# Patient Record
Sex: Male | Born: 1955 | Race: White | Hispanic: No | Marital: Married | Smoking: Never smoker
Health system: Southern US, Community
[De-identification: ages and names within clinical notes are randomized; demographics above are authoritative.]

## PROBLEM LIST (undated history)

## (undated) DIAGNOSIS — Z9889 Other specified postprocedural states: Secondary | ICD-10-CM

## (undated) DIAGNOSIS — T4145XA Adverse effect of unspecified anesthetic, initial encounter: Secondary | ICD-10-CM

## (undated) DIAGNOSIS — D473 Essential (hemorrhagic) thrombocythemia: Secondary | ICD-10-CM

## (undated) DIAGNOSIS — D759 Disease of blood and blood-forming organs, unspecified: Secondary | ICD-10-CM

## (undated) DIAGNOSIS — R112 Nausea with vomiting, unspecified: Secondary | ICD-10-CM

## (undated) DIAGNOSIS — J189 Pneumonia, unspecified organism: Secondary | ICD-10-CM

## (undated) DIAGNOSIS — M1990 Unspecified osteoarthritis, unspecified site: Secondary | ICD-10-CM

## (undated) DIAGNOSIS — K219 Gastro-esophageal reflux disease without esophagitis: Secondary | ICD-10-CM

## (undated) DIAGNOSIS — G4730 Sleep apnea, unspecified: Secondary | ICD-10-CM

## (undated) DIAGNOSIS — D75839 Thrombocytosis, unspecified: Secondary | ICD-10-CM

## (undated) DIAGNOSIS — T8859XA Other complications of anesthesia, initial encounter: Secondary | ICD-10-CM

## (undated) HISTORY — DX: Thrombocytosis, unspecified: D75.839

## (undated) HISTORY — PX: ROTATOR CUFF REPAIR: SHX139

## (undated) HISTORY — DX: Pneumonia, unspecified organism: J18.9

## (undated) HISTORY — DX: Disease of blood and blood-forming organs, unspecified: D75.9

## (undated) HISTORY — DX: Essential (hemorrhagic) thrombocythemia: D47.3

---

## 2008-04-18 ENCOUNTER — Ambulatory Visit: Payer: Self-pay | Admitting: Hematology and Oncology

## 2008-05-03 LAB — CBC WITH DIFFERENTIAL/PLATELET
Basophils Absolute: 0 10*3/uL (ref 0.0–0.1)
EOS%: 2.6 % (ref 0.0–7.0)
Eosinophils Absolute: 0.2 10*3/uL (ref 0.0–0.5)
HCT: 46.5 % (ref 38.7–49.9)
HGB: 15.8 g/dL (ref 13.0–17.1)
LYMPH%: 26.6 % (ref 14.0–48.0)
MCV: 92.9 fL (ref 81.6–98.0)
MONO%: 9.2 % (ref 0.0–13.0)
NEUT#: 3.6 10*3/uL (ref 1.5–6.5)
NEUT%: 60.8 % (ref 40.0–75.0)
Platelets: 484 10*3/uL — ABNORMAL HIGH (ref 145–400)
RBC: 5.01 10*6/uL (ref 4.20–5.71)
WBC: 6 10*3/uL (ref 4.0–10.0)
lymph#: 1.6 10*3/uL (ref 0.9–3.3)

## 2008-05-07 LAB — HEPATITIS B SURFACE ANTIGEN: Hepatitis B Surface Ag: NEGATIVE

## 2008-05-07 LAB — COMPREHENSIVE METABOLIC PANEL
ALT: 15 U/L (ref 0–53)
AST: 17 U/L (ref 0–37)
BUN: 13 mg/dL (ref 6–23)
CO2: 26 mEq/L (ref 19–32)
Calcium: 9.9 mg/dL (ref 8.4–10.5)
Creatinine, Ser: 0.81 mg/dL (ref 0.40–1.50)
Total Bilirubin: 1.3 mg/dL — ABNORMAL HIGH (ref 0.3–1.2)

## 2008-05-07 LAB — IRON AND TIBC
%SAT: 44 % (ref 20–55)
TIBC: 329 ug/dL (ref 215–435)

## 2008-05-07 LAB — HEPATITIS A ANTIBODY, IGM: Hep A IgM: NEGATIVE

## 2008-05-07 LAB — HEPATITIS B CORE ANTIBODY, IGM: Hep B C IgM: NEGATIVE

## 2008-05-07 LAB — HEPATITIS C ANTIBODY: HCV Ab: NEGATIVE

## 2008-05-07 LAB — FERRITIN: Ferritin: 41 ng/mL (ref 22–322)

## 2008-05-07 LAB — HEPATITIS B CORE ANTIBODY, TOTAL: Hep B Core Total Ab: NEGATIVE

## 2008-05-10 ENCOUNTER — Ambulatory Visit (HOSPITAL_COMMUNITY): Admission: RE | Admit: 2008-05-10 | Discharge: 2008-05-10 | Payer: Self-pay | Admitting: Hematology and Oncology

## 2008-05-14 LAB — BCR/ABL

## 2008-09-09 ENCOUNTER — Ambulatory Visit: Payer: Self-pay | Admitting: Hematology and Oncology

## 2008-11-27 ENCOUNTER — Ambulatory Visit: Payer: Self-pay | Admitting: Hematology and Oncology

## 2008-11-29 LAB — CBC WITH DIFFERENTIAL/PLATELET
EOS%: 2.4 % (ref 0.0–7.0)
Eosinophils Absolute: 0.2 10*3/uL (ref 0.0–0.5)
MCHC: 34 g/dL (ref 32.0–36.0)
MONO#: 0.8 10*3/uL (ref 0.1–0.9)
Platelets: 609 10*3/uL — ABNORMAL HIGH (ref 140–400)
RBC: 4.92 10*6/uL (ref 4.20–5.82)
RDW: 14 % (ref 11.0–14.6)
WBC: 6.3 10*3/uL (ref 4.0–10.3)

## 2008-11-29 LAB — COMPREHENSIVE METABOLIC PANEL
ALT: 16 U/L (ref 0–53)
Albumin: 4.2 g/dL (ref 3.5–5.2)
CO2: 27 mEq/L (ref 19–32)
Calcium: 9.7 mg/dL (ref 8.4–10.5)
Chloride: 103 mEq/L (ref 96–112)
Creatinine, Ser: 0.85 mg/dL (ref 0.40–1.50)
Potassium: 4.6 mEq/L (ref 3.5–5.3)
Total Protein: 7 g/dL (ref 6.0–8.3)

## 2009-01-08 LAB — CBC WITH DIFFERENTIAL/PLATELET
BASO%: 0.9 % (ref 0.0–2.0)
Eosinophils Absolute: 0.2 10*3/uL (ref 0.0–0.5)
HCT: 45.3 % (ref 38.4–49.9)
LYMPH%: 27 % (ref 14.0–49.0)
NEUT%: 56.2 % (ref 39.0–75.0)
Platelets: 557 10*3/uL — ABNORMAL HIGH (ref 140–400)
RBC: 4.83 10*6/uL (ref 4.20–5.82)
WBC: 6.5 10*3/uL (ref 4.0–10.3)
lymph#: 1.8 10*3/uL (ref 0.9–3.3)

## 2009-03-10 ENCOUNTER — Ambulatory Visit: Payer: Self-pay | Admitting: Hematology and Oncology

## 2009-03-13 LAB — CBC WITH DIFFERENTIAL/PLATELET
HCT: 45.2 % (ref 38.4–49.9)
HGB: 15.3 g/dL (ref 13.0–17.1)
LYMPH%: 32.1 % (ref 14.0–49.0)
MCH: 31.7 pg (ref 27.2–33.4)
MCV: 93.6 fL (ref 79.3–98.0)
MONO#: 0.7 10*3/uL (ref 0.1–0.9)
Platelets: 590 10*3/uL — ABNORMAL HIGH (ref 140–400)
RBC: 4.83 10*6/uL (ref 4.20–5.82)
RDW: 14.4 % (ref 11.0–14.6)
lymph#: 1.9 10*3/uL (ref 0.9–3.3)

## 2009-03-13 LAB — BASIC METABOLIC PANEL
BUN: 15 mg/dL (ref 6–23)
Calcium: 9.5 mg/dL (ref 8.4–10.5)
Creatinine, Ser: 0.79 mg/dL (ref 0.40–1.50)

## 2009-06-11 ENCOUNTER — Ambulatory Visit: Payer: Self-pay | Admitting: Hematology and Oncology

## 2009-07-02 LAB — CBC WITH DIFFERENTIAL/PLATELET
Basophils Absolute: 0 10*3/uL (ref 0.0–0.1)
MONO#: 0.6 10*3/uL (ref 0.1–0.9)
Platelets: 595 10*3/uL — ABNORMAL HIGH (ref 140–400)
RBC: 4.67 10*6/uL (ref 4.20–5.82)
RDW: 14.3 % (ref 11.0–14.6)
WBC: 5.4 10*3/uL (ref 4.0–10.3)
lymph#: 1.3 10*3/uL (ref 0.9–3.3)

## 2009-09-09 ENCOUNTER — Ambulatory Visit: Payer: Self-pay | Admitting: Hematology and Oncology

## 2009-09-11 LAB — BASIC METABOLIC PANEL
BUN: 20 mg/dL (ref 6–23)
Chloride: 102 mEq/L (ref 96–112)
Potassium: 4.5 mEq/L (ref 3.5–5.3)
Sodium: 136 mEq/L (ref 135–145)

## 2009-09-11 LAB — CBC WITH DIFFERENTIAL/PLATELET
BASO%: 0.7 % (ref 0.0–2.0)
Basophils Absolute: 0.1 10*3/uL (ref 0.0–0.1)
EOS%: 4.3 % (ref 0.0–7.0)
HCT: 46.4 % (ref 38.4–49.9)
LYMPH%: 25.6 % (ref 14.0–49.0)
MCH: 32.7 pg (ref 27.2–33.4)
MCHC: 34.1 g/dL (ref 32.0–36.0)
MCV: 96 fL (ref 79.3–98.0)
NEUT#: 4.3 10*3/uL (ref 1.5–6.5)
NEUT%: 55.9 % (ref 39.0–75.0)
RDW: 14.4 % (ref 11.0–14.6)

## 2009-09-24 ENCOUNTER — Encounter: Admission: RE | Admit: 2009-09-24 | Discharge: 2009-09-24 | Payer: Self-pay | Admitting: Family Medicine

## 2009-12-17 ENCOUNTER — Ambulatory Visit: Payer: Self-pay | Admitting: Hematology and Oncology

## 2009-12-18 LAB — CBC WITH DIFFERENTIAL/PLATELET
Basophils Absolute: 0 10*3/uL (ref 0.0–0.1)
EOS%: 3.5 % (ref 0.0–7.0)
HCT: 44.6 % (ref 38.4–49.9)
HGB: 15.2 g/dL (ref 13.0–17.1)
LYMPH%: 25.5 % (ref 14.0–49.0)
MCHC: 34.1 g/dL (ref 32.0–36.0)
MONO#: 0.8 10*3/uL (ref 0.1–0.9)
NEUT#: 3.7 10*3/uL (ref 1.5–6.5)
NEUT%: 58.4 % (ref 39.0–75.0)
WBC: 6.3 10*3/uL (ref 4.0–10.3)
lymph#: 1.6 10*3/uL (ref 0.9–3.3)

## 2010-03-09 ENCOUNTER — Ambulatory Visit: Payer: Self-pay | Admitting: Hematology and Oncology

## 2010-03-11 LAB — CBC WITH DIFFERENTIAL/PLATELET
BASO%: 1.2 % (ref 0.0–2.0)
EOS%: 4.7 % (ref 0.0–7.0)
HCT: 43.2 % (ref 38.4–49.9)
HGB: 14.7 g/dL (ref 13.0–17.1)
MCH: 31.9 pg (ref 27.2–33.4)
MCHC: 34 g/dL (ref 32.0–36.0)
MCV: 93.8 fL (ref 79.3–98.0)
MONO%: 14.2 % — ABNORMAL HIGH (ref 0.0–14.0)
NEUT#: 3 10*3/uL (ref 1.5–6.5)
WBC: 6 10*3/uL (ref 4.0–10.3)

## 2010-03-11 LAB — COMPREHENSIVE METABOLIC PANEL
Calcium: 9.1 mg/dL (ref 8.4–10.5)
Chloride: 104 mEq/L (ref 96–112)
Creatinine, Ser: 0.74 mg/dL (ref 0.40–1.50)
Sodium: 137 mEq/L (ref 135–145)
Total Bilirubin: 0.8 mg/dL (ref 0.3–1.2)

## 2010-03-11 LAB — LACTATE DEHYDROGENASE: LDH: 156 U/L (ref 94–250)

## 2010-09-07 ENCOUNTER — Ambulatory Visit: Payer: Self-pay | Admitting: Hematology and Oncology

## 2010-09-09 LAB — CBC WITH DIFFERENTIAL/PLATELET
BASO%: 0.4 % (ref 0.0–2.0)
Basophils Absolute: 0 10*3/uL (ref 0.0–0.1)
EOS%: 2.2 % (ref 0.0–7.0)
HCT: 46.5 % (ref 38.4–49.9)
HGB: 15.5 g/dL (ref 13.0–17.1)
MCH: 31.5 pg (ref 27.2–33.4)
MCHC: 33.4 g/dL (ref 32.0–36.0)
Platelets: 655 10*3/uL — ABNORMAL HIGH (ref 140–400)
RBC: 4.92 10*6/uL (ref 4.20–5.82)
lymph#: 1.7 10*3/uL (ref 0.9–3.3)

## 2010-09-09 LAB — BASIC METABOLIC PANEL
CO2: 23 mEq/L (ref 19–32)
Sodium: 137 mEq/L (ref 135–145)

## 2010-09-09 LAB — LACTATE DEHYDROGENASE: LDH: 150 U/L (ref 94–250)

## 2010-09-14 LAB — HYPERCOAGULABLE PANEL, COMPREHENSIVE
Beta-2 Glyco I IgG: 0 G Units (ref <20)
Lupus Anticoagulant: NOT DETECTED
Protein C Activity: 125 % (ref 75–133)
Protein C, Total: 100 % (ref 70–140)

## 2011-03-03 ENCOUNTER — Other Ambulatory Visit: Payer: Self-pay | Admitting: Hematology and Oncology

## 2011-03-03 ENCOUNTER — Encounter (HOSPITAL_BASED_OUTPATIENT_CLINIC_OR_DEPARTMENT_OTHER): Payer: BC Managed Care – PPO | Admitting: Hematology and Oncology

## 2011-03-03 DIAGNOSIS — D473 Essential (hemorrhagic) thrombocythemia: Secondary | ICD-10-CM

## 2011-03-03 LAB — BASIC METABOLIC PANEL
CO2: 25 mEq/L (ref 19–32)
Calcium: 9.9 mg/dL (ref 8.4–10.5)
Creatinine, Ser: 0.77 mg/dL (ref 0.50–1.35)
Potassium: 4.8 mEq/L (ref 3.5–5.3)
Sodium: 137 mEq/L (ref 135–145)

## 2011-03-03 LAB — CBC WITH DIFFERENTIAL/PLATELET
EOS%: 2.7 % (ref 0.0–7.0)
HCT: 46.3 % (ref 38.4–49.9)
MCHC: 33.7 g/dL (ref 32.0–36.0)
NEUT%: 61.2 % (ref 39.0–75.0)
RBC: 4.98 10*6/uL (ref 4.20–5.82)
lymph#: 1.9 10*3/uL (ref 0.9–3.3)

## 2011-04-12 ENCOUNTER — Emergency Department (HOSPITAL_COMMUNITY): Payer: Worker's Compensation

## 2011-04-12 ENCOUNTER — Emergency Department (HOSPITAL_COMMUNITY)
Admission: EM | Admit: 2011-04-12 | Discharge: 2011-04-12 | Disposition: A | Discharge status: Home / Self Care | Payer: Worker's Compensation | Attending: Emergency Medicine | Admitting: Emergency Medicine

## 2011-04-12 DIAGNOSIS — R5383 Other fatigue: Secondary | ICD-10-CM | POA: Insufficient documentation

## 2011-04-12 DIAGNOSIS — R110 Nausea: Secondary | ICD-10-CM | POA: Insufficient documentation

## 2011-04-12 DIAGNOSIS — R05 Cough: Secondary | ICD-10-CM | POA: Insufficient documentation

## 2011-04-12 DIAGNOSIS — R5381 Other malaise: Secondary | ICD-10-CM | POA: Insufficient documentation

## 2011-04-12 DIAGNOSIS — R42 Dizziness and giddiness: Secondary | ICD-10-CM | POA: Insufficient documentation

## 2011-04-12 DIAGNOSIS — Z79899 Other long term (current) drug therapy: Secondary | ICD-10-CM | POA: Insufficient documentation

## 2011-04-12 DIAGNOSIS — R509 Fever, unspecified: Secondary | ICD-10-CM | POA: Insufficient documentation

## 2011-04-12 DIAGNOSIS — M25519 Pain in unspecified shoulder: Secondary | ICD-10-CM | POA: Insufficient documentation

## 2011-04-12 DIAGNOSIS — R059 Cough, unspecified: Secondary | ICD-10-CM | POA: Insufficient documentation

## 2011-04-12 DIAGNOSIS — Z7982 Long term (current) use of aspirin: Secondary | ICD-10-CM | POA: Insufficient documentation

## 2011-04-12 LAB — URINALYSIS, ROUTINE W REFLEX MICROSCOPIC
Ketones, ur: NEGATIVE mg/dL
Leukocytes, UA: NEGATIVE
Nitrite: NEGATIVE
Protein, ur: NEGATIVE mg/dL

## 2011-04-12 LAB — COMPREHENSIVE METABOLIC PANEL
ALT: 103 U/L — ABNORMAL HIGH (ref 0–53)
AST: 55 U/L — ABNORMAL HIGH (ref 0–37)
Albumin: 3 g/dL — ABNORMAL LOW (ref 3.5–5.2)
Alkaline Phosphatase: 106 U/L (ref 39–117)
BUN: 15 mg/dL (ref 6–23)
CO2: 31 mEq/L (ref 19–32)
Calcium: 9.3 mg/dL (ref 8.4–10.5)
Chloride: 97 mEq/L (ref 96–112)
Creatinine, Ser: 0.7 mg/dL (ref 0.50–1.35)
GFR calc Af Amer: >60 mL/min (ref >60)
GFR calc non Af Amer: >60 mL/min (ref >60)
Glucose, Bld: 103 mg/dL — ABNORMAL HIGH (ref 70–99)
Potassium: 4.1 mEq/L (ref 3.5–5.1)
Sodium: 136 mEq/L (ref 135–145)
Total Bilirubin: 0.4 mg/dL (ref 0.3–1.2)
Total Protein: 7.4 g/dL (ref 6.0–8.3)

## 2011-04-12 LAB — CBC
HCT: 37.1 % — ABNORMAL LOW (ref 39.0–52.0)
MCV: 91.2 fL (ref 78.0–100.0)
RBC: 4.07 MIL/uL — ABNORMAL LOW (ref 4.22–5.81)
RDW: 13.7 % (ref 11.5–15.5)
WBC: 12 10*3/uL — ABNORMAL HIGH (ref 4.0–10.5)

## 2011-04-12 LAB — DIFFERENTIAL
Eosinophils Relative: 3 % (ref 0–5)
Lymphocytes Relative: 16 % (ref 12–46)
Lymphs Abs: 2 10*3/uL (ref 0.7–4.0)

## 2011-04-12 LAB — LACTIC ACID, PLASMA: Lactic Acid, Venous: 0.8 mmol/L (ref 0.5–2.2)

## 2011-07-28 ENCOUNTER — Telehealth: Payer: Self-pay | Admitting: Hematology and Oncology

## 2011-07-28 NOTE — Telephone Encounter (Signed)
appt info given to pt for 09/03/11,aom

## 2011-08-30 ENCOUNTER — Encounter: Payer: Self-pay | Admitting: *Deleted

## 2011-09-03 ENCOUNTER — Telehealth: Payer: Self-pay | Admitting: Hematology and Oncology

## 2011-09-03 ENCOUNTER — Ambulatory Visit: Payer: BC Managed Care – PPO

## 2011-09-03 ENCOUNTER — Other Ambulatory Visit: Payer: Self-pay | Admitting: Lab

## 2011-09-03 ENCOUNTER — Ambulatory Visit: Payer: Self-pay | Admitting: Hematology and Oncology

## 2011-09-03 ENCOUNTER — Ambulatory Visit (HOSPITAL_BASED_OUTPATIENT_CLINIC_OR_DEPARTMENT_OTHER): Payer: BC Managed Care – PPO | Admitting: Hematology and Oncology

## 2011-09-03 ENCOUNTER — Other Ambulatory Visit (HOSPITAL_BASED_OUTPATIENT_CLINIC_OR_DEPARTMENT_OTHER): Payer: BC Managed Care – PPO

## 2011-09-03 VITALS — BP 115/66 | HR 82 | Temp 98.2°F | Ht 69.5 in | Wt 172.5 lb

## 2011-09-03 DIAGNOSIS — D751 Secondary polycythemia: Secondary | ICD-10-CM

## 2011-09-03 DIAGNOSIS — D473 Essential (hemorrhagic) thrombocythemia: Secondary | ICD-10-CM

## 2011-09-03 LAB — CBC WITH DIFFERENTIAL/PLATELET
BASO%: 0.6 % (ref 0.0–2.0)
EOS%: 1.4 % (ref 0.0–7.0)
MCH: 30.9 pg (ref 27.2–33.4)
MCHC: 33.7 g/dL (ref 32.0–36.0)
MONO%: 6.6 % (ref 0.0–14.0)
NEUT%: 72 % (ref 39.0–75.0)
RDW: 14.8 % — ABNORMAL HIGH (ref 11.0–14.6)
lymph#: 1.5 10*3/uL (ref 0.9–3.3)

## 2011-09-03 LAB — BASIC METABOLIC PANEL
BUN: 14 mg/dL (ref 6–23)
Calcium: 9.1 mg/dL (ref 8.4–10.5)
Potassium: 4.3 mEq/L (ref 3.5–5.3)
Sodium: 137 mEq/L (ref 135–145)

## 2011-09-03 NOTE — Telephone Encounter (Signed)
sent pt back to lab and gv pt appt for [phone]

## 2011-09-03 NOTE — Progress Notes (Signed)
This office note has been dictated.

## 2011-09-03 NOTE — Progress Notes (Signed)
CC:   Joshua Cabrera. Little, M.D. Reinaldo Meeker, M.D.  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  INTERIM HISTORY:  Joshua Cabrera reports having undergone a left shoulder surgery a few months ago.  He tells me that he developed arthritis.  He is also now complaining of pain in his left hip area.  He has had no weight loss, fever, chills.  He is not experiencing pruritus.  MEDICATIONS:  Reviewed and updated.  ALLERGIES:  Penicillin.  PHYSICAL EXAMINATION:  General:  Alert and oriented times 3.  Vitals: Pulse 82, blood pressure 115/66, temperature 98.2, respirations 20, weight 172 pounds.  HEENT:  Sclerae anicteric.  Mouth moist.  Chest/CVS: Unremarkable.  Abdomen:  Soft, nontender.  Bowel sounds present. Extremities:  No edema.  LAB DATA:  On 08/24/2011 white cell count 7.9, hemoglobin 15.2, hematocrit 45.1, platelets 606 (589).  IMPRESSION AND PLAN:  Joshua Cabrera is a 56 year old man with JAK2 positivity, essential thrombocytosis.  His platelet count remains about stable in the 600,000 range.  However, he does have inflammatory joint disease which may be affecting results.  Would check a ferritin level. He is on aspirin.  I have asked him to follow up with Dr. Clarene Duke to see if rheumatology referral would be appropriate.  Otherwise patient follows up in six months time.     ______________________________ Laurice Record, M.D. LIO/MEDQ  D:  09/03/2011  T:  09/03/2011  Job:  [id number]

## 2012-03-02 ENCOUNTER — Other Ambulatory Visit: Payer: Self-pay

## 2012-03-02 ENCOUNTER — Encounter: Payer: Self-pay | Admitting: Hematology and Oncology

## 2012-03-02 DIAGNOSIS — M2550 Pain in unspecified joint: Secondary | ICD-10-CM | POA: Insufficient documentation

## 2012-03-02 NOTE — Progress Notes (Signed)
This office note has been dictated.    This encounter was created in error - please disregard.

## 2012-03-02 NOTE — Patient Instructions (Signed)
Joshua Cabrera  [id number]  [facility] Discharge Instructions  RECOMMENDATIONS MADE BY THE CONSULTANT AND ANY TEST RESULTS WILL BE SENT TO YOUR REFERRING DOCTOR.   EXAM FINDINGS BY MD TODAY AND SIGNS AND SYMPTOMS TO REPORT TO CLINIC OR PRIMARY MD:   Your current list of medications are: Current Outpatient Prescriptions  Medication Sig Dispense Refill  . aspirin 81 MG tablet Take 81 mg by mouth daily.      Marland Kitchen ibuprofen (ADVIL,MOTRIN) 400 MG tablet Take 400 mg by mouth daily as needed.       . Melatonin 10 MG TABS Take 3 mg by mouth at bedtime.       . Multiple Vitamins-Minerals (MULTIVITAL PO) Take 1 tablet by mouth daily.         INSTRUCTIONS GIVEN AND DISCUSSED:   SPECIAL INSTRUCTIONS/FOLLOW-UP:  See above.  I acknowledge that I have been informed and understand all the instructions given to me and received a copy. I do not have any more questions at this time, but understand that I may call the [facility] at [phone] during business hours should I have any further questions or need assistance in obtaining follow-up care.

## 2012-03-03 ENCOUNTER — Other Ambulatory Visit: Payer: Self-pay | Admitting: Gastroenterology

## 2012-03-03 ENCOUNTER — Other Ambulatory Visit: Payer: Self-pay | Admitting: *Deleted

## 2012-03-03 ENCOUNTER — Telehealth: Payer: Self-pay | Admitting: *Deleted

## 2012-03-03 NOTE — Telephone Encounter (Signed)
Pt  [facility]  For appts  03/02/12.   Pt called and left message to reschedule appts.   Onc tx schedule to scheduler.

## 2012-03-06 ENCOUNTER — Telehealth: Payer: Self-pay | Admitting: *Deleted

## 2012-03-06 NOTE — Telephone Encounter (Signed)
left voice message to inform the patient of the new date and time on 03-17-2012

## 2012-03-17 ENCOUNTER — Ambulatory Visit: Payer: BC Managed Care – PPO | Admitting: Hematology and Oncology

## 2012-03-17 ENCOUNTER — Other Ambulatory Visit: Payer: BC Managed Care – PPO

## 2012-03-17 ENCOUNTER — Telehealth: Payer: Self-pay | Admitting: Nurse Practitioner

## 2012-03-17 NOTE — Telephone Encounter (Signed)
Opened in error

## 2012-03-22 ENCOUNTER — Telehealth: Payer: Self-pay | Admitting: *Deleted

## 2012-03-22 NOTE — Telephone Encounter (Signed)
Pt called and requesting to reschedule apts.  Pt [facility] appts on 03/17/12.    Pt stated he had cancelled appts from the call tree reminder.   Informed pt that md is out office and will not be back until  03/28/12.   Pt stated he will be available on Wed 8/14  And  Fri  03/07/12.   Informed pt that a scheduler will contact pt with md's instructions. Pt's  Phone    [phone].

## 2012-03-27 ENCOUNTER — Telehealth: Payer: Self-pay | Admitting: Hematology and Oncology

## 2012-03-27 ENCOUNTER — Other Ambulatory Visit: Payer: Self-pay | Admitting: *Deleted

## 2012-03-27 NOTE — Telephone Encounter (Signed)
lmonvm for pt re appt for 8/23 and mailed schedule.

## 2012-04-07 ENCOUNTER — Other Ambulatory Visit (HOSPITAL_BASED_OUTPATIENT_CLINIC_OR_DEPARTMENT_OTHER): Payer: BC Managed Care – PPO | Admitting: Lab

## 2012-04-07 ENCOUNTER — Telehealth: Payer: Self-pay | Admitting: Hematology and Oncology

## 2012-04-07 ENCOUNTER — Encounter: Payer: Self-pay | Admitting: Hematology and Oncology

## 2012-04-07 ENCOUNTER — Ambulatory Visit (HOSPITAL_BASED_OUTPATIENT_CLINIC_OR_DEPARTMENT_OTHER): Payer: BC Managed Care – PPO | Admitting: Hematology and Oncology

## 2012-04-07 VITALS — BP 121/69 | HR 75 | Temp 97.1°F | Resp 18 | Ht 69.5 in | Wt 172.8 lb

## 2012-04-07 DIAGNOSIS — D473 Essential (hemorrhagic) thrombocythemia: Secondary | ICD-10-CM

## 2012-04-07 DIAGNOSIS — D751 Secondary polycythemia: Secondary | ICD-10-CM

## 2012-04-07 DIAGNOSIS — D45 Polycythemia vera: Secondary | ICD-10-CM

## 2012-04-07 LAB — BASIC METABOLIC PANEL
CO2: 27 mEq/L (ref 19–32)
Chloride: 103 mEq/L (ref 96–112)
Glucose, Bld: 110 mg/dL — ABNORMAL HIGH (ref 70–99)
Potassium: 4.1 mEq/L (ref 3.5–5.3)
Sodium: 136 mEq/L (ref 135–145)

## 2012-04-07 LAB — CBC WITH DIFFERENTIAL/PLATELET
Eosinophils Absolute: 0.3 10*3/uL (ref 0.0–0.5)
LYMPH%: 24.2 % (ref 14.0–49.0)
MONO#: 0.9 10*3/uL (ref 0.1–0.9)
NEUT#: 4.7 10*3/uL (ref 1.5–6.5)
Platelets: 661 10*3/uL — ABNORMAL HIGH (ref 140–400)
RBC: 4.81 10*6/uL (ref 4.20–5.82)
RDW: 15.4 % — ABNORMAL HIGH (ref 11.0–14.6)
WBC: 8.1 10*3/uL (ref 4.0–10.3)
lymph#: 2 10*3/uL (ref 0.9–3.3)

## 2012-04-07 NOTE — Patient Instructions (Addendum)
Joshua Cabrera  [id number]   [facility] - AFTER VISIT SUMMARY   **RECOMMENDATIONS MADE BY THE CONSULTANT AND ANY TEST    RESULTS WILL BE SENT TO YOUR REFERRING DOCTORS.   YOUR EXAM FINDINGS, LABS AND RESULTS WERE DISCUSSED BY YOUR MD TODAY.    Your vital signs are: Filed Vitals:   04/07/12 1501  BP: 121/69  Pulse: 75  Temp: 97.1 F (36.2 C)  Resp: 18    Your Updated drug allergies are: Allergies as of 04/07/2012 - Review Complete 04/07/2012  Allergen Reaction Noted  . Penicillins  08/30/2011    Your current list of medications are: Current Outpatient Prescriptions  Medication Sig Dispense Refill  . aspirin 81 MG tablet Take 81 mg by mouth daily.      Marland Kitchen ibuprofen (ADVIL,MOTRIN) 400 MG tablet Take 400 mg by mouth daily as needed.       . Melatonin 10 MG TABS Take 3 mg by mouth at bedtime.       . Multiple Vitamins-Minerals (MULTIVITAL PO) Take 1 tablet by mouth daily.         INSTRUCTIONS GIVEN AND DISCUSSED:  See attached schedule   SPECIAL INSTRUCTIONS/FOLLOW-UP:  See above.  I acknowledge that I have been informed and understand all the instructions given to me and received a copy. I do not have any more questions at this time, but understand that I may call the [facility] at [phone] during business hours should I have any further questions or need assistance in obtaining follow-up care.

## 2012-04-07 NOTE — Progress Notes (Signed)
CC:   Anna Genre. Little, M.D. Reinaldo Meeker, M.D.  IDENTIFYING STATEMENT:  The patient is a 56 year old man with essential thrombocytosis (JAK2 mutation positive) who presents for followup.  INTERVAL HISTORY:  Mr. Joshua Cabrera follows up every 6 months.  He has ongoing arthralgia in his hips despite cortisone injections.  He is requiring ibuprofen.  Otherwise no bruising or bleeding.  No adenopathy.  Denies weight loss.  CBC obtained 04/07/2012 white cell count 8.1, hemoglobin 14.7, hematocrit 44.4, platelets 661 (606).  MEDICATIONS:  Aspirin 81 mg daily.  Rest of medications reviewed and updated.  PHYSICAL EXAM:  General:  The patient is alert and oriented x3.  Vitals: Pulse 75, blood pressure 121/69, temperature 97.1, respirations 18, weight 172.8 pounds.  HEENT:  Head is atraumatic, normocephalic. Sclerae anicteric.  Mouth moist.  Chest:  Clear.  Abdomen:  Soft, nontender.  Bowel sounds present.  Extremities:  No edema.  LABORATORY DATA:  CBC as above.  BMET pending.  IMPRESSION AND PLAN:  Mr. Girgis is a 56 year old man with JAK2 positive essential thrombocytosis.  His platelets remain in 600,000 range although I think some of which is due to underlying inflammatory joint disease.  We will continue to monitor.  He will continue with aspirin. He is to follow up sooner if needed.  We will see him in 6 months' time  with labs.    ______________________________ Laurice Record, M.D. LIO/MEDQ  D:  04/07/2012  T:  04/07/2012  Job:  [id number]

## 2012-04-07 NOTE — Telephone Encounter (Signed)
appts made and printed for pt aom °

## 2012-04-07 NOTE — Progress Notes (Signed)
This office note has been dictated.

## 2012-08-05 ENCOUNTER — Telehealth: Payer: Self-pay | Admitting: Oncology

## 2012-08-05 NOTE — Telephone Encounter (Signed)
lvm for pt regarding rescheduling appt on 1.21.14..advised pt to call back Monday after 9:00am...former pt of Dr. Dalene Carrow reassigned to Dr. Reece Agar.

## 2012-08-19 ENCOUNTER — Encounter: Payer: Self-pay | Admitting: *Deleted

## 2012-08-30 ENCOUNTER — Telehealth: Payer: Self-pay | Admitting: Oncology

## 2012-08-30 NOTE — Telephone Encounter (Signed)
Per instruction from LT moved lb/fu 1/2 months out. S/w pt re new d/t for 3/3 @ 1:15pm and new provider JG/LT.

## 2012-09-05 ENCOUNTER — Other Ambulatory Visit: Payer: BC Managed Care – PPO | Admitting: Lab

## 2012-09-05 ENCOUNTER — Ambulatory Visit: Payer: BC Managed Care – PPO | Admitting: Nurse Practitioner

## 2012-09-05 ENCOUNTER — Ambulatory Visit: Payer: BC Managed Care – PPO | Admitting: Hematology and Oncology

## 2012-10-16 ENCOUNTER — Ambulatory Visit (HOSPITAL_BASED_OUTPATIENT_CLINIC_OR_DEPARTMENT_OTHER): Payer: BC Managed Care – PPO | Admitting: Nurse Practitioner

## 2012-10-16 ENCOUNTER — Other Ambulatory Visit: Payer: Self-pay | Admitting: *Deleted

## 2012-10-16 ENCOUNTER — Telehealth: Payer: Self-pay | Admitting: Oncology

## 2012-10-16 ENCOUNTER — Other Ambulatory Visit (HOSPITAL_BASED_OUTPATIENT_CLINIC_OR_DEPARTMENT_OTHER): Payer: BC Managed Care – PPO

## 2012-10-16 VITALS — BP 127/64 | HR 85 | Temp 97.2°F | Resp 20 | Ht 69.5 in | Wt 175.2 lb

## 2012-10-16 DIAGNOSIS — D473 Essential (hemorrhagic) thrombocythemia: Secondary | ICD-10-CM

## 2012-10-16 LAB — CBC WITH DIFFERENTIAL/PLATELET
Basophils Absolute: 0.1 10*3/uL (ref 0.0–0.1)
EOS%: 2.7 % (ref 0.0–7.0)
Eosinophils Absolute: 0.2 10*3/uL (ref 0.0–0.5)
HCT: 47.5 % (ref 38.4–49.9)
HGB: 16.2 g/dL (ref 13.0–17.1)
MCH: 30.7 pg (ref 27.2–33.4)
MCV: 90 fL (ref 79.3–98.0)
MONO%: 8.8 % (ref 0.0–14.0)
NEUT#: 4.8 10*3/uL (ref 1.5–6.5)
NEUT%: 64.1 % (ref 39.0–75.0)
Platelets: 643 10*3/uL — ABNORMAL HIGH (ref 140–400)

## 2012-10-16 NOTE — Progress Notes (Signed)
OFFICE PROGRESS NOTE  Interval history:  Joshua Cabrera is a 58 year old man initially referred to our office in 2009 for evaluation of an elevated platelet count. He was seen in an initial visit by Dr. Dalene Carrow on 05/03/2008. Platelet count at that time was 484,000 with a white count of 6.0 and hemoglobin 15.8. JAK 2 mutation analysis returned positive. It was recommended that he begin aspirin 81 mg daily. He has been followed since that time with routine labs and clinic visits.  He is seen today to establish care with Dr. Cyndie Chime.  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 He has had right and left rotator cuff repair. He reports a history of pneumonia on 3 occasions in the past.  Current medications include aspirin 81 mg daily, a multivitamin and ibuprofen as needed.  He is allergic to penicillin which causes a rash.  His mother passed away with a stroke at age 44. She had osteoarthritis. His maternal grandmother also had strokes. His father is 73 years old and has undergone open heart surgery for coronary artery disease. He has 2 sisters, ages 13 and 94, both with osteoarthritis.  He lives in [place]. He is married. He has 2 sons, ages 35 and 93. Both reported to be in good health. He teaches in the performing arts department at [employer] No tobacco use. He drinks one to 2 beers per day. He occasionally drinks wine and bourbon.  No fevers or sweats. No anorexia or weight loss. Chronic pain as noted above related to osteoarthritis. No unusual headaches. No vision change. No shortness breath, cough or chest pain. No leg swelling or calf pain. He occasionally notes a small amount of blood with bowel movements. He reports undergoing GI evaluation with a colonoscopy 2 years ago. He reports a source of bleeding was not identified. No urinary problems.    Objective: Blood pressure 127/64, pulse 85, temperature 97.2 F (36.2 C), temperature source  Oral, resp. rate 20, height 5' 9.5" (1.765 m), weight 175 lb 3.2 oz (79.47 kg).  Oropharynx is without thrush or ulceration. No palpable cervical, supraclavicular or axillary lymph nodes. Lungs are clear. No wheezes or rales. Regular cardiac rhythm. Abdomen is soft and nontender. No organomegaly. Extremities are without edema.  Lab Results: Lab Results  Component Value Date   WBC 7.4 10/16/2012   HGB 16.2 10/16/2012   HCT 47.5 10/16/2012   MCV 90.0 10/16/2012   PLT 643* 10/16/2012    Chemistry:    Chemistry      Component Value Date/Time   NA 136 04/07/2012 1449   K 4.1 04/07/2012 1449   CL 103 04/07/2012 1449   CO2 27 04/07/2012 1449   BUN 14 04/07/2012 1449   CREATININE 0.79 04/07/2012 1449      Component Value Date/Time   CALCIUM 9.1 04/07/2012 1449   ALKPHOS 106 04/12/2011 1654   AST 55* 04/12/2011 1654   ALT 103* 04/12/2011 1654   BILITOT 0.4 04/12/2011 1654       Studies/Results: No results found.  Medications: I have reviewed the patient's current medications.  Assessment/Plan:  1. Essential thrombocytosis, positive JAK 2 mutation analysis. 2. Osteoarthritis.  Disposition-Joshua Cabrera's platelet count is stable. Dr. Cyndie Chime reviewed the diagnosis in detail at today's visit and recommended continuation of daily low-dose aspirin.  Joshua Cabrera lives in [place] and would like followup closer to home. We have made a referral to Dr. Koleen Nimrod at [facility].  We did not schedule Joshua Cabrera formal followup in our office. We will be happy to see him in the future as needed.   Patient interviewed and examined by Dr. Cyndie Chime.  Lonna Cobb ANP/GNP-BC

## 2012-10-16 NOTE — Telephone Encounter (Signed)
Pt wants to transfer to [facility] , gave referral to HIM and they will call him for appt

## 2012-10-17 ENCOUNTER — Ambulatory Visit: Payer: Self-pay | Admitting: Internal Medicine

## 2012-10-18 ENCOUNTER — Telehealth: Payer: Self-pay | Admitting: Oncology

## 2012-10-18 NOTE — Telephone Encounter (Signed)
A referral was made to Dr. Koleen Nimrod @ [facility]. Dr Koleen Nimrod is not accepting new pts at this time. The pt has a appt. With Dr. Sherrlyn Hock @ [facility] on 03/14/13@10 :45. Pt is aware.

## 2012-10-20 ENCOUNTER — Telehealth: Payer: Self-pay | Admitting: Oncology

## 2012-10-20 NOTE — Telephone Encounter (Signed)
Per 3/3 pof referral entered for care to be transferred to [facility]. HIM informed. Via email (outlook and EPIC).

## 2012-10-20 NOTE — Telephone Encounter (Signed)
Printed referral for pt to transfer care to [facility], left on [employer] @ HIM

## 2013-03-14 ENCOUNTER — Ambulatory Visit: Payer: Self-pay | Admitting: Internal Medicine

## 2013-03-14 LAB — CBC CANCER CENTER
Basophil #: 0.2 x10 3/mm — ABNORMAL HIGH (ref 0.0–0.1)
HCT: 43.2 % (ref 40.0–52.0)
HGB: 14.6 g/dL (ref 13.0–18.0)
Lymphocyte #: 1.7 x10 3/mm (ref 1.0–3.6)
Lymphocyte %: 23 %
MCHC: 33.8 g/dL (ref 32.0–36.0)
MCV: 93 fL (ref 80–100)
Monocyte #: 1 x10 3/mm (ref 0.2–1.0)
Monocyte %: 13.6 %
Neutrophil %: 56.1 %
RBC: 4.64 10*6/uL (ref 4.40–5.90)
RDW: 14.6 % — ABNORMAL HIGH (ref 11.5–14.5)
WBC: 7.2 x10 3/mm (ref 3.8–10.6)

## 2013-03-14 LAB — CREATININE, SERUM
EGFR (African American): >60
EGFR (Non-African Amer.): >60

## 2013-03-14 LAB — IRON AND TIBC: Iron: 109 ug/dL (ref 65–175)

## 2013-03-16 ENCOUNTER — Ambulatory Visit: Payer: Self-pay | Admitting: Internal Medicine

## 2013-09-14 ENCOUNTER — Ambulatory Visit: Payer: Self-pay | Admitting: Internal Medicine

## 2013-09-14 LAB — CBC CANCER CENTER
BASOS ABS: 0.1 x10 3/mm (ref 0.0–0.1)
BASOS PCT: 0.7 %
EOS ABS: 0.4 x10 3/mm (ref 0.0–0.7)
EOS PCT: 4.4 %
HCT: 44.9 % (ref 40.0–52.0)
HGB: 14.8 g/dL (ref 13.0–18.0)
Lymphocyte #: 1.9 x10 3/mm (ref 1.0–3.6)
Lymphocyte %: 21.3 %
MCH: 31 pg (ref 26.0–34.0)
MCHC: 33 g/dL (ref 32.0–36.0)
MCV: 94 fL (ref 80–100)
MONOS PCT: 12.9 %
Monocyte #: 1.1 x10 3/mm — ABNORMAL HIGH (ref 0.2–1.0)
NEUTROS ABS: 5.4 x10 3/mm (ref 1.4–6.5)
Neutrophil %: 60.7 %
PLATELETS: 657 x10 3/mm — AB (ref 150–440)
RBC: 4.78 10*6/uL (ref 4.40–5.90)
RDW: 14.1 % (ref 11.5–14.5)
WBC: 8.9 x10 3/mm (ref 3.8–10.6)

## 2013-09-16 ENCOUNTER — Ambulatory Visit: Payer: Self-pay

## 2013-09-16 ENCOUNTER — Ambulatory Visit: Payer: Self-pay | Admitting: Internal Medicine

## 2013-12-05 ENCOUNTER — Ambulatory Visit: Payer: Self-pay | Admitting: Internal Medicine

## 2014-01-14 HISTORY — PX: BACK SURGERY: SHX140

## 2014-01-17 ENCOUNTER — Ambulatory Visit: Payer: Self-pay | Admitting: Internal Medicine

## 2014-01-18 ENCOUNTER — Other Ambulatory Visit: Payer: Self-pay | Admitting: Orthopedic Surgery

## 2014-01-18 LAB — HEPATIC FUNCTION PANEL A (ARMC)
ALBUMIN: 3.8 g/dL (ref 3.4–5.0)
AST: 19 U/L (ref 15–37)
Alkaline Phosphatase: 80 U/L
BILIRUBIN DIRECT: 0.2 mg/dL (ref 0.00–0.20)
Bilirubin,Total: 1.1 mg/dL — ABNORMAL HIGH (ref 0.2–1.0)
SGPT (ALT): 20 U/L (ref 12–78)
Total Protein: 7.5 g/dL (ref 6.4–8.2)

## 2014-01-18 LAB — CBC CANCER CENTER
Basophil #: 0 x10 3/mm (ref 0.0–0.1)
Basophil %: 0.2 %
EOS ABS: 0.5 x10 3/mm (ref 0.0–0.7)
EOS PCT: 5.8 %
HCT: 48.1 % (ref 40.0–52.0)
HGB: 15.9 g/dL (ref 13.0–18.0)
Lymphocyte #: 2 x10 3/mm (ref 1.0–3.6)
Lymphocyte %: 24.9 %
MCH: 30.9 pg (ref 26.0–34.0)
MCHC: 33.1 g/dL (ref 32.0–36.0)
MCV: 93 fL (ref 80–100)
MONOS PCT: 13.1 %
Monocyte #: 1 x10 3/mm (ref 0.2–1.0)
Neutrophil #: 4.4 x10 3/mm (ref 1.4–6.5)
Neutrophil %: 56 %
Platelet: 680 x10 3/mm — ABNORMAL HIGH (ref 150–440)
RBC: 5.16 10*6/uL (ref 4.40–5.90)
RDW: 14.1 % (ref 11.5–14.5)
WBC: 7.9 x10 3/mm (ref 3.8–10.6)

## 2014-01-18 LAB — CREATININE, SERUM
CREATININE: 0.82 mg/dL (ref 0.60–1.30)
EGFR (African American): >60

## 2014-01-29 ENCOUNTER — Encounter (HOSPITAL_COMMUNITY): Payer: Self-pay | Admitting: Pharmacy Technician

## 2014-01-29 LAB — CBC CANCER CENTER
Basophil #: 0.1 x10 3/mm (ref 0.0–0.1)
Basophil %: 1.2 %
EOS PCT: 3 %
Eosinophil #: 0.3 x10 3/mm (ref 0.0–0.7)
HCT: 46.3 % (ref 40.0–52.0)
HGB: 15.4 g/dL (ref 13.0–18.0)
Lymphocyte #: 1.7 x10 3/mm (ref 1.0–3.6)
Lymphocyte %: 18.7 %
MCH: 31.3 pg (ref 26.0–34.0)
MCHC: 33.2 g/dL (ref 32.0–36.0)
MCV: 94 fL (ref 80–100)
MONOS PCT: 11.2 %
Monocyte #: 1 x10 3/mm (ref 0.2–1.0)
Neutrophil #: 5.9 x10 3/mm (ref 1.4–6.5)
Neutrophil %: 65.9 %
Platelet: 649 x10 3/mm — ABNORMAL HIGH (ref 150–440)
RBC: 4.91 10*6/uL (ref 4.40–5.90)
RDW: 14.2 % (ref 11.5–14.5)
WBC: 8.9 x10 3/mm (ref 3.8–10.6)

## 2014-02-05 ENCOUNTER — Ambulatory Visit (HOSPITAL_COMMUNITY)
Admission: RE | Admit: 2014-02-05 | Discharge: 2014-02-05 | Disposition: A | Discharge status: Home / Self Care | Payer: BC Managed Care – PPO | Source: Ambulatory Visit | Attending: Orthopedic Surgery | Admitting: Orthopedic Surgery

## 2014-02-05 ENCOUNTER — Encounter (HOSPITAL_COMMUNITY): Payer: Self-pay

## 2014-02-05 ENCOUNTER — Encounter (HOSPITAL_COMMUNITY)
Admission: RE | Admit: 2014-02-05 | Discharge: 2014-02-05 | Disposition: A | Discharge status: Home / Self Care | Payer: BC Managed Care – PPO | Source: Ambulatory Visit | Attending: Orthopedic Surgery | Admitting: Orthopedic Surgery

## 2014-02-05 DIAGNOSIS — M1610 Unilateral primary osteoarthritis, unspecified hip: Secondary | ICD-10-CM | POA: Insufficient documentation

## 2014-02-05 DIAGNOSIS — M169 Osteoarthritis of hip, unspecified: Secondary | ICD-10-CM | POA: Diagnosis present

## 2014-02-05 HISTORY — DX: Gastro-esophageal reflux disease without esophagitis: K21.9

## 2014-02-05 HISTORY — DX: Unspecified osteoarthritis, unspecified site: M19.90

## 2014-02-05 HISTORY — DX: Other specified postprocedural states: Z98.890

## 2014-02-05 HISTORY — DX: Other specified postprocedural states: R11.2

## 2014-02-05 HISTORY — DX: Other complications of anesthesia, initial encounter: T88.59XA

## 2014-02-05 HISTORY — DX: Adverse effect of unspecified anesthetic, initial encounter: T41.45XA

## 2014-02-05 LAB — COMPREHENSIVE METABOLIC PANEL
ALT: 14 U/L (ref 0–53)
AST: 22 U/L (ref 0–37)
Albumin: 3.6 g/dL (ref 3.5–5.2)
Alkaline Phosphatase: 74 U/L (ref 39–117)
BUN: 19 mg/dL (ref 6–23)
CALCIUM: 9.1 mg/dL (ref 8.4–10.5)
CO2: 26 meq/L (ref 19–32)
CREATININE: 0.76 mg/dL (ref 0.50–1.35)
Chloride: 101 mEq/L (ref 96–112)
GLUCOSE: 78 mg/dL (ref 70–99)
Potassium: 4.3 mEq/L (ref 3.7–5.3)
Sodium: 140 mEq/L (ref 137–147)
Total Bilirubin: 1.1 mg/dL (ref 0.3–1.2)
Total Protein: 6.5 g/dL (ref 6.0–8.3)

## 2014-02-05 LAB — URINALYSIS, ROUTINE W REFLEX MICROSCOPIC
Bilirubin Urine: NEGATIVE
GLUCOSE, UA: NEGATIVE mg/dL
HGB URINE DIPSTICK: NEGATIVE
KETONES UR: NEGATIVE mg/dL
Leukocytes, UA: NEGATIVE
Nitrite: NEGATIVE
PROTEIN: NEGATIVE mg/dL
Specific Gravity, Urine: 1.02 (ref 1.005–1.030)
Urobilinogen, UA: 0.2 mg/dL (ref 0.0–1.0)
pH: 7 (ref 5.0–8.0)

## 2014-02-05 LAB — CBC
HCT: 42 % (ref 39.0–52.0)
HEMOGLOBIN: 14.5 g/dL (ref 13.0–17.0)
MCH: 31.5 pg (ref 26.0–34.0)
MCHC: 34.5 g/dL (ref 30.0–36.0)
MCV: 91.3 fL (ref 78.0–100.0)
Platelets: 646 10*3/uL — ABNORMAL HIGH (ref 150–400)
RBC: 4.6 MIL/uL (ref 4.22–5.81)
RDW: 14.5 % (ref 11.5–15.5)
WBC: 6.7 10*3/uL (ref 4.0–10.5)

## 2014-02-05 LAB — PROTIME-INR
INR: 0.94 (ref 0.00–1.49)
Prothrombin Time: 12.4 seconds (ref 11.6–15.2)

## 2014-02-05 LAB — APTT: aPTT: 36 seconds (ref 24–37)

## 2014-02-05 LAB — SURGICAL PCR SCREEN
MRSA, PCR: NEGATIVE
Staphylococcus aureus: POSITIVE — AB

## 2014-02-05 LAB — ABO/RH: ABO/RH(D): O POS

## 2014-02-05 NOTE — Patient Instructions (Addendum)
20 [address]  02/05/2014   Your procedure is scheduled on:  6-29 -2015  Enter through [facility] Entrance and follow signs to [facility]. Arrive at   1045     AM.  Call this number if you have problems the morning of surgery: [phone]  Or Presurgical Testing [phone]([place]) For Living Will and/or Health Care Power Attorney Forms: please provide copy for your medical record,may bring AM of surgery(Forms should be already notarized -we do not provide this service).(02-05-14 prefers information  today, packet given).   Nothing to eat or drink :After Midnight.  May have clear liquids:up to 6 Hours before arrival. Nothing after : 0700 AM.  Clear liquids include soda, tea, black coffee, apple or grape juice, broth.  Take these medicines the morning of surgery with A SIP OF WATER: NONE.   Do not wear jewelry, make-up or nail polish.  Do not wear lotions, powders, or perfumes. You may wear deodorant.  Do not shave 48 hours(2 days) prior to first CHG shower(legs and under arms).(Shaving face and neck okay.)  Do not bring valuables to the hospital.(Hospital is not responsible for lost valuables).  Contacts, dentures or removable bridgework, body piercing, hair pins may not be worn into surgery.  Leave suitcase in the car. After surgery it may be brought to your room.  For patients admitted to the hospital, checkout time is 11:00 AM the day of discharge.(Restricted visitors-Any Persons displaying flu-like symptoms or illness).    Patients discharged the day of surgery will not be allowed to drive home. Must have responsible person with you x 24 hours once discharged.  Name and phone number of your driver: Waymon Laser -[phone] cell  Special Instructions: CHG(Chlorhedine 4%-"Hibiclens","Betasept","Aplicare") Shower Use Special Wash: see special instructions.(avoid face and genitals)   Please read over the following fact sheets that you were given: MRSA Information,  Blood Transfusion fact sheet, Incentive Spirometry Instruction.  Remember : Type/Screen "Blue armbands" - may not be removed once applied(would result in being retested AM of surgery, if removed).  Failure to follow these instructions may result in Cancellation of your surgery.   ________________________    [facility] - Preparing for Surgery Before surgery, you can play an important role.  Because skin is not sterile, your skin needs to be as free of germs as possible.  You can reduce the number of germs on your skin by washing with CHG (chlorahexidine gluconate) soap before surgery.  CHG is an antiseptic cleaner which kills germs and bonds with the skin to continue killing germs even after washing. Please DO NOT use if you have an allergy to CHG or antibacterial soaps.  If your skin becomes reddened/irritated stop using the CHG and inform your nurse when you arrive at Short Stay. Do not shave (including legs and underarms) for at least 48 hours prior to the first CHG shower.  You may shave your face/neck. Please follow these instructions carefully:  1.  Shower with CHG Soap the night before surgery and the  morning of Surgery.  2.  If you choose to wash your hair, wash your hair first as usual with your  normal  shampoo.  3.  After you shampoo, rinse your hair and body thoroughly to remove the  shampoo.                           4.  Use CHG as you would any other liquid  soap.  You can apply chg directly  to the skin and wash                       Gently with a scrungie or clean washcloth.  5.  Apply the CHG Soap to your body ONLY FROM THE NECK DOWN.   Do not use on face/ open                           Wound or open sores. Avoid contact with eyes, ears mouth and genitals (private parts).                       Wash face,  Genitals (private parts) with your normal soap.             6.  Wash thoroughly, paying special attention to the area where your surgery  will be performed.  7.  Thoroughly  rinse your body with warm water from the neck down.  8.  DO NOT shower/wash with your normal soap after using and rinsing off  the CHG Soap.                9.  Pat yourself dry with a clean towel.            10.  Wear clean pajamas.            11.  Place clean sheets on your bed the night of your first shower and do not  sleep with pets. Day of Surgery : Do not apply any lotions/deodorants the morning of surgery.  Please wear clean clothes to the hospital/surgery center.  FAILURE TO FOLLOW THESE INSTRUCTIONS MAY RESULT IN THE CANCELLATION OF YOUR SURGERY PATIENT SIGNATURE_________________________________  NURSE SIGNATURE__________________________________  ________________________________________________________________________   Adam Phenix  An incentive spirometer is a tool that can help keep your lungs clear and active. This tool measures how well you are filling your lungs with each breath. Taking long deep breaths may help reverse or decrease the chance of developing breathing (pulmonary) problems (especially infection) following:  A long period of time when you are unable to move or be active. BEFORE THE PROCEDURE   If the spirometer includes an indicator to show your best effort, your nurse or respiratory therapist will set it to a desired goal.  If possible, sit up straight or lean slightly forward. Try not to slouch.  Hold the incentive spirometer in an upright position. INSTRUCTIONS FOR USE  1. Sit on the edge of your bed if possible, or sit up as far as you can in bed or on a chair. 2. Hold the incentive spirometer in an upright position. 3. Breathe out normally. 4. Place the mouthpiece in your mouth and seal your lips tightly around it. 5. Breathe in slowly and as deeply as possible, raising the piston or the ball toward the top of the column. 6. Hold your breath for 3-5 seconds or for as long as possible. Allow the piston or ball to fall to the bottom of the  column. 7. Remove the mouthpiece from your mouth and breathe out normally. 8. Rest for a few seconds and repeat Steps 1 through 7 at least 10 times every 1-2 hours when you are awake. Take your time and take a few normal breaths between deep breaths. 9. The spirometer may include an indicator to show your best effort. Use the indicator as a goal  to work toward during each repetition. 10. After each set of 10 deep breaths, practice coughing to be sure your lungs are clear. If you have an incision (the cut made at the time of surgery), support your incision when coughing by placing a pillow or rolled up towels firmly against it. Once you are able to get out of bed, walk around indoors and cough well. You may stop using the incentive spirometer when instructed by your caregiver.  RISKS AND COMPLICATIONS  Take your time so you do not get dizzy or light-headed.  If you are in pain, you may need to take or ask for pain medication before doing incentive spirometry. It is harder to take a deep breath if you are having pain. AFTER USE  Rest and breathe slowly and easily.  It can be helpful to keep track of a log of your progress. Your caregiver can provide you with a simple table to help with this. If you are using the spirometer at home, follow these instructions: [place] IF:   You are having difficultly using the spirometer.  You have trouble using the spirometer as often as instructed.  Your pain medication is not giving enough relief while using the spirometer.  You develop fever of 100.5 F (38.1 C) or higher. SEEK IMMEDIATE MEDICAL CARE IF:   You cough up bloody sputum that had not been present before.  You develop fever of 102 F (38.9 C) or greater.  You develop worsening pain at or near the incision site. MAKE SURE YOU:   Understand these instructions.  Will watch your condition.  Will get help right away if you are not doing well or get worse. Document Released:  12/13/2006 Document Revised: 10/25/2011 Document Reviewed: 02/13/2007 ExitCare Patient Information 2014 Exit[employer], [place].   ________________________________________________________________________  WHAT IS A BLOOD TRANSFUSION? Blood Transfusion Information  A transfusion is the replacement of blood or some of its parts. Blood is made up of multiple cells which provide different functions.  Red blood cells carry oxygen and are used for blood loss replacement.  White blood cells fight against infection.  Platelets control bleeding.  Plasma helps clot blood.  Other blood products are available for specialized needs, such as hemophilia or other clotting disorders. BEFORE THE TRANSFUSION  Who gives blood for transfusions?   Healthy volunteers who are fully evaluated to make sure their blood is safe. This is blood bank blood. Transfusion therapy is the safest it has ever been in the practice of medicine. Before blood is taken from a donor, a complete history is taken to make sure that person has no history of diseases nor engages in risky social behavior (examples are intravenous drug use or sexual activity with multiple partners). The donor's travel history is screened to minimize risk of transmitting infections, such as malaria. The donated blood is tested for signs of infectious diseases, such as HIV and hepatitis. The blood is then tested to be sure it is compatible with you in order to minimize the chance of a transfusion reaction. If you or a relative donates blood, this is often done in anticipation of surgery and is not appropriate for emergency situations. It takes many days to process the donated blood. RISKS AND COMPLICATIONS Although transfusion therapy is very safe and saves many lives, the main dangers of transfusion include:   Getting an infectious disease.  Developing a transfusion reaction. This is an allergic reaction to something in the blood you were given. Every precaution  is taken to prevent this. The decision to have a blood transfusion has been considered carefully by your caregiver before blood is given. Blood is not given unless the benefits outweigh the risks. AFTER THE TRANSFUSION  Right after receiving a blood transfusion, you will usually feel much better and more energetic. This is especially true if your red blood cells have gotten low (anemic). The transfusion raises the level of the red blood cells which carry oxygen, and this usually causes an energy increase.  The nurse administering the transfusion will monitor you carefully for complications. HOME CARE INSTRUCTIONS  No special instructions are needed after a transfusion. You may find your energy is better. Speak with your caregiver about any limitations on activity for underlying diseases you may have. SEEK MEDICAL CARE IF:   Your condition is not improving after your transfusion.  You develop redness or irritation at the intravenous (IV) site. SEEK IMMEDIATE MEDICAL CARE IF:  Any of the following symptoms occur over the next 12 hours:  Shaking chills.  You have a temperature by mouth above 102 F (38.9 C), not controlled by medicine.  Chest, back, or muscle pain.  People around you feel you are not acting correctly or are confused.  Shortness of breath or difficulty breathing.  Dizziness and fainting.  You get a rash or develop hives.  You have a decrease in urine output.  Your urine turns a dark color or changes to pink, red, or brown. Any of the following symptoms occur over the next 10 days:  You have a temperature by mouth above 102 F (38.9 C), not controlled by medicine.  Shortness of breath.  Weakness after normal activity.  The white part of the eye turns yellow (jaundice).  You have a decrease in the amount of urine or are urinating less often.  Your urine turns a dark color or changes to pink, red, or brown. Document Released: 07/30/2000 Document Revised:  10/25/2011 Document Reviewed: 03/18/2008 [facility] Patient Information 2014 [place], [place].  _______________________________________________________________________

## 2014-02-05 NOTE — Pre-Procedure Instructions (Signed)
Ekg 01-02-14 with chart. Left Hip Xray today.

## 2014-02-05 NOTE — Pre-Procedure Instructions (Addendum)
02-05-14 1545 Positive Staph aureus PCR- Rx. Called to [facility] [address], [place], [place]. Pt. Notified to use as directed.

## 2014-02-06 NOTE — Progress Notes (Signed)
Faxed lab results fro 02/05/14 to Judeen Hammans at [facility] [phone] at her request

## 2014-02-10 ENCOUNTER — Other Ambulatory Visit: Payer: Self-pay | Admitting: Orthopedic Surgery

## 2014-02-10 NOTE — H&P (Signed)
Joshua Cabrera DOB: 31-Dec-1955 Married / Language: Cleophus Molt / Race: White Male  Date of Admission:  02-11-2014  Chief Complaint:  Left Hip Pain  History of Present Illness The patient is a 58 year old male who comes in for a preoperative History and Physical. The patient is scheduled for a left total hip arthroplasty (anterior approach) to be performed by Dr. Dione Plover. Aluisio, MD at [facility] on 02-11-2014. The patient is a 58 year old male who presents for follow up of their hip. The patient is being followed for their left hip pain and osteoarthritis. Symptoms reported today include: pain, aching, stiffness and difficulty ambulating. The patient feels that they are doing poorly and report their pain level to be moderate to severe. Current treatment includes: use of cane. The following medication has been used for pain control: antiinflammatory medication (ibuprofen). The patient indicates that they are ready to proceed with a total hip arthroplasty. He is scheduled for 02/11/14. He is ready to proceed with surgery. He has severe pain in his left hip. It limits what he can and cannot do. It's been going on for years, getting progressively worse over time. They have been treated conservatively in the past for the above stated problem and despite conservative measures, they continue to have progressive pain and severe functional limitations and dysfunction. They have failed non-operative management including home exercise, medications. It is felt that they would benefit from undergoing total joint replacement. Risks and benefits of the procedure have been discussed with the patient and they elect to proceed with surgery. There are no active contraindications to surgery such as ongoing infection or rapidly progressive neurological disease.   Allergies Penicillin VK *PENICILLINS*   Problem List/Past Medical Osteoarthritis of left hip, unspecified osteoarthritis type (715.95   M16.12) Right tennis elbow (726.32  M77.11) Sleep Apnea. (no official testing - patient volunteered information) Osteoarthritis Gastroesophageal Reflux Disease   Family History Depression. Paternal Grandfather. grandfather fathers side Mother. Deceased. Osteoarthritis. Maternal Grandmother, Mother, Sister. mother, sister and grandmother mothers side Cerebrovascular Accident. Maternal Grandfather, Maternal Grandmother, Mother. mother, grandmother mothers side and grandfather mothers side Father. Living.    Social History Illicit drug use. no Current drinker. 12/14/2013: Currently drinks beer, wine and hard liquor 8-14 times per week Children. 2 Number of flights of stairs before winded. greater than 5 Not under pain contract Tobacco use. Never smoker. never smoker Pain Contract. no Drug/Alcohol Rehab (Currently). no Drug/Alcohol Rehab (Previously). no Alcohol use. current drinker; drinks beer, wine and hard liquor; 8-14 per week Current work status. working full time Exercise. Exercises rarely; does running / walking No history of drug/alcohol rehab Marital status. married Living situation. live with spouse    Medication History( Famotidine (10MG  Tablet, Oral) Active. Aspirin EC (81MG  Tablet DR, Oral) Active. Multivitamin ( Oral) Active. Ibuprofen (200MG  Tablet, Oral) Active. (600mg  in the am, then as needed) Hydroxyurea (500MG  Capsule, 2 tabs daily Oral) Active. Melatonin ( Oral) Specific dose unknown - Active.    Past Surgical History Arthroscopy of Shoulder. bilateral Rotator Cuff Repair. bilateral    Review of Systems General:Not Present- Chills, Fever, Night Sweats, Fatigue, Weight Gain, Weight Loss and Memory Loss. Skin:Not Present- Hives, Itching, Rash, Eczema and Lesions. HEENT:Not Present- Tinnitus, Headache, Double Vision, Visual Loss, Hearing Loss and Dentures. Respiratory:Not Present- Shortness of breath with  exertion, Shortness of breath at rest, Allergies, Coughing up blood and Chronic Cough. Cardiovascular:Not Present- Chest Pain, Racing/skipping heartbeats, Difficulty Breathing Lying Down, Murmur, Swelling and Palpitations. Gastrointestinal:Not  Present- Bloody Stool, Heartburn, Abdominal Pain, Vomiting, Nausea, Constipation, Diarrhea, Difficulty Swallowing, Jaundice and Loss of appetitie. Male Genitourinary:Not Present- Urinary frequency, Blood in Urine, Weak urinary stream, Discharge, Flank Pain, Incontinence, Painful Urination, Urgency, Urinary Retention and Urinating at Night. Musculoskeletal:Present- Joint Pain. Not Present- Muscle Weakness, Muscle Pain, Joint Swelling, Back Pain, Morning Stiffness and Spasms. Neurological:Not Present- Tremor, Dizziness, Blackout spells, Paralysis, Difficulty with balance and Weakness. Psychiatric:Not Present- Insomnia.    Vitals Weight: 168 lb Height: 70 in Weight was reported by patient. Height was reported by patient. Body Surface Area: 1.94 m Body Mass Index: 24.11 kg/m BP: 112/60 (Sitting, Left Arm, Standard)     Physical Exam The physical exam findings are as follows:   General Mental Status - Alert, cooperative and good historian. General Appearance- pleasant. Not in acute distress. Orientation- Oriented X3. Build & Nutrition- Well nourished and Well developed.   Head and Neck Head- normocephalic, atraumatic . Neck Global Assessment- supple. no bruit auscultated on the right and no bruit auscultated on the left.   Eye Vision- Wears corrective lenses. Pupil- Bilateral- Regular and Round. Motion- Bilateral- EOMI.   Chest and Lung Exam Auscultation: Breath sounds:- clear at anterior chest wall and - clear at posterior chest wall. Adventitious sounds:- No Adventitious sounds.   Cardiovascular Auscultation:Rhythm- Regular rate and rhythm. Heart Sounds- S1 WNL and S2 WNL. Murmurs & Other  Heart Sounds:Auscultation of the heart reveals - No Murmurs.   Abdomen Palpation/Percussion:Tenderness- Abdomen is non-tender to palpation. Rigidity (guarding)- Abdomen is soft. Auscultation:Auscultation of the abdomen reveals - Bowel sounds normal.   Male Genitourinary Not done, not pertinent to present illness  Musculoskeletal On exam, he's in no distress. His left hip can be flexed to 90. No internal or external rotation. Only about 20 degrees of abduction.  RADIOGRAPHS: His radiographs are reviewed. He has severe end stage bone on bone arthritis, left hip, with a large subchondral cyst.   Assessment & Plan Osteoarthritis of left hip, unspecified osteoarthritis type (715.95  M16.12) Impression: Left Hip  Note: Plan is for a Left Total Hip Replacement - anterior approach by Dr. Wynelle Link.  Plan is to go home.  PCP - Dr. Hulan Fess  The patient does not have any contraindications and will receive TXA (tranexamic acid) prior to surgery.  Signed electronically by Joelene Millin, III PA-C

## 2014-02-11 ENCOUNTER — Encounter (HOSPITAL_COMMUNITY): Payer: BC Managed Care – PPO | Admitting: Certified Registered Nurse Anesthetist

## 2014-02-11 ENCOUNTER — Inpatient Hospital Stay (HOSPITAL_COMMUNITY)
Admission: RE | Admit: 2014-02-11 | Discharge: 2014-02-13 | DRG: 470 | Disposition: A | Discharge status: Home Health | Payer: BC Managed Care – PPO | Source: Ambulatory Visit | Attending: Orthopedic Surgery | Admitting: Orthopedic Surgery

## 2014-02-11 ENCOUNTER — Encounter (HOSPITAL_COMMUNITY): Payer: Self-pay | Admitting: *Deleted

## 2014-02-11 ENCOUNTER — Encounter (HOSPITAL_COMMUNITY)
Admission: RE | Disposition: A | Discharge status: Home Health | Payer: Self-pay | Source: Ambulatory Visit | Attending: Orthopedic Surgery

## 2014-02-11 ENCOUNTER — Inpatient Hospital Stay (HOSPITAL_COMMUNITY): Payer: BC Managed Care – PPO

## 2014-02-11 ENCOUNTER — Inpatient Hospital Stay (HOSPITAL_COMMUNITY): Payer: BC Managed Care – PPO | Admitting: Certified Registered Nurse Anesthetist

## 2014-02-11 DIAGNOSIS — R001 Bradycardia, unspecified: Secondary | ICD-10-CM | POA: Diagnosis not present

## 2014-02-11 DIAGNOSIS — M62838 Other muscle spasm: Secondary | ICD-10-CM | POA: Diagnosis not present

## 2014-02-11 DIAGNOSIS — Z96642 Presence of left artificial hip joint: Secondary | ICD-10-CM

## 2014-02-11 DIAGNOSIS — Z823 Family history of stroke: Secondary | ICD-10-CM

## 2014-02-11 DIAGNOSIS — M8560 Other cyst of bone, unspecified site: Secondary | ICD-10-CM | POA: Diagnosis present

## 2014-02-11 DIAGNOSIS — E871 Hypo-osmolality and hyponatremia: Secondary | ICD-10-CM | POA: Diagnosis not present

## 2014-02-11 DIAGNOSIS — R066 Hiccough: Secondary | ICD-10-CM | POA: Diagnosis not present

## 2014-02-11 DIAGNOSIS — Z791 Long term (current) use of non-steroidal anti-inflammatories (NSAID): Secondary | ICD-10-CM

## 2014-02-11 DIAGNOSIS — M76899 Other specified enthesopathies of unspecified lower limb, excluding foot: Secondary | ICD-10-CM | POA: Diagnosis present

## 2014-02-11 DIAGNOSIS — Z8261 Family history of arthritis: Secondary | ICD-10-CM

## 2014-02-11 DIAGNOSIS — Z880 Allergy status to penicillin: Secondary | ICD-10-CM

## 2014-02-11 DIAGNOSIS — Z7982 Long term (current) use of aspirin: Secondary | ICD-10-CM

## 2014-02-11 DIAGNOSIS — D751 Secondary polycythemia: Secondary | ICD-10-CM

## 2014-02-11 DIAGNOSIS — D45 Polycythemia vera: Secondary | ICD-10-CM | POA: Diagnosis present

## 2014-02-11 DIAGNOSIS — Z79899 Other long term (current) drug therapy: Secondary | ICD-10-CM

## 2014-02-11 DIAGNOSIS — M1612 Unilateral primary osteoarthritis, left hip: Secondary | ICD-10-CM

## 2014-02-11 DIAGNOSIS — R110 Nausea: Secondary | ICD-10-CM | POA: Diagnosis not present

## 2014-02-11 DIAGNOSIS — M1610 Unilateral primary osteoarthritis, unspecified hip: Principal | ICD-10-CM | POA: Diagnosis present

## 2014-02-11 DIAGNOSIS — I498 Other specified cardiac arrhythmias: Secondary | ICD-10-CM | POA: Diagnosis not present

## 2014-02-11 DIAGNOSIS — K219 Gastro-esophageal reflux disease without esophagitis: Secondary | ICD-10-CM | POA: Diagnosis present

## 2014-02-11 DIAGNOSIS — M169 Osteoarthritis of hip, unspecified: Principal | ICD-10-CM

## 2014-02-11 HISTORY — PX: TOTAL HIP ARTHROPLASTY: SHX124

## 2014-02-11 LAB — TYPE AND SCREEN
ABO/RH(D): O POS
Antibody Screen: NEGATIVE

## 2014-02-11 SURGERY — ARTHROPLASTY, HIP, TOTAL, ANTERIOR APPROACH
Anesthesia: Spinal | Site: Hip | Laterality: Left

## 2014-02-11 MED ORDER — BUPIVACAINE HCL (PF) 0.25 % IJ SOLN
INTRAMUSCULAR | Status: AC
  Filled 2014-02-11: qty 30

## 2014-02-11 MED ORDER — PROPOFOL 10 MG/ML IV BOLUS
INTRAVENOUS | Status: AC
  Filled 2014-02-11: qty 20

## 2014-02-11 MED ORDER — PROPOFOL 10 MG/ML IV BOLUS
INTRAVENOUS | Status: DC | PRN
  Administered 2014-02-11: 40 mg via INTRAVENOUS

## 2014-02-11 MED ORDER — MIDAZOLAM HCL 5 MG/5ML IJ SOLN
INTRAMUSCULAR | Status: DC | PRN
  Administered 2014-02-11: 2 mg via INTRAVENOUS

## 2014-02-11 MED ORDER — MEPERIDINE HCL 50 MG/ML IJ SOLN: 6.2500 mg | INTRAMUSCULAR | Status: DC | PRN

## 2014-02-11 MED ORDER — DEXAMETHASONE SODIUM PHOSPHATE 10 MG/ML IJ SOLN
INTRAMUSCULAR | Status: AC
  Filled 2014-02-11: qty 1

## 2014-02-11 MED ORDER — ACETAMINOPHEN 325 MG PO TABS: 650.0000 mg | ORAL_TABLET | Freq: Four times a day (QID) | ORAL | Status: DC | PRN

## 2014-02-11 MED ORDER — FAMOTIDINE 20 MG PO TABS
20.0000 mg | ORAL_TABLET | Freq: Every day | ORAL | Status: DC | PRN
  Administered 2014-02-12 – 2014-02-13 (×2): 20 mg via ORAL
  Filled 2014-02-11 (×2): qty 1

## 2014-02-11 MED ORDER — DEXAMETHASONE SODIUM PHOSPHATE 10 MG/ML IJ SOLN
10.0000 mg | Freq: Every day | INTRAMUSCULAR | Status: AC
  Filled 2014-02-11: qty 1

## 2014-02-11 MED ORDER — METOCLOPRAMIDE HCL 5 MG/ML IJ SOLN
5.0000 mg | Freq: Three times a day (TID) | INTRAMUSCULAR | Status: DC | PRN
  Administered 2014-02-12: 10 mg via INTRAVENOUS
  Filled 2014-02-11 (×2): qty 2

## 2014-02-11 MED ORDER — TRAMADOL HCL 50 MG PO TABS
50.0000 mg | ORAL_TABLET | Freq: Four times a day (QID) | ORAL | Status: DC | PRN
  Administered 2014-02-12 – 2014-02-13 (×2): 100 mg via ORAL
  Filled 2014-02-11 (×2): qty 2

## 2014-02-11 MED ORDER — DEXAMETHASONE 4 MG PO TABS
10.0000 mg | ORAL_TABLET | Freq: Every day | ORAL | Status: AC
  Administered 2014-02-12: 10 mg via ORAL
  Filled 2014-02-11: qty 1

## 2014-02-11 MED ORDER — DEXTROSE-NACL 5-0.9 % IV SOLN
INTRAVENOUS | Status: DC
  Administered 2014-02-11 – 2014-02-12 (×3): via INTRAVENOUS

## 2014-02-11 MED ORDER — PHENOL 1.4 % MT LIQD: 1.0000 | OROMUCOSAL | Status: DC | PRN

## 2014-02-11 MED ORDER — ACETAMINOPHEN 500 MG PO TABS
1000.0000 mg | ORAL_TABLET | Freq: Four times a day (QID) | ORAL | Status: AC
  Administered 2014-02-11 – 2014-02-12 (×4): 1000 mg via ORAL
  Filled 2014-02-11 (×5): qty 2

## 2014-02-11 MED ORDER — BUPIVACAINE HCL (PF) 0.5 % IJ SOLN
INTRAMUSCULAR | Status: AC
  Filled 2014-02-11: qty 30

## 2014-02-11 MED ORDER — RIVAROXABAN 10 MG PO TABS
10.0000 mg | ORAL_TABLET | Freq: Every day | ORAL | Status: DC
  Administered 2014-02-12 – 2014-02-13 (×2): 10 mg via ORAL
  Filled 2014-02-11 (×3): qty 1

## 2014-02-11 MED ORDER — LACTATED RINGERS IV SOLN: INTRAVENOUS | Status: DC

## 2014-02-11 MED ORDER — HYDROXYUREA 500 MG PO CAPS
1000.0000 mg | ORAL_CAPSULE | Freq: Every day | ORAL | Status: DC
  Administered 2014-02-12 – 2014-02-13 (×2): 1000 mg via ORAL
  Filled 2014-02-11 (×3): qty 2

## 2014-02-11 MED ORDER — MIDAZOLAM HCL 2 MG/2ML IJ SOLN
INTRAMUSCULAR | Status: AC
  Filled 2014-02-11: qty 2

## 2014-02-11 MED ORDER — ONDANSETRON HCL 4 MG PO TABS: 4.0000 mg | ORAL_TABLET | Freq: Four times a day (QID) | ORAL | Status: DC | PRN

## 2014-02-11 MED ORDER — LACTATED RINGERS IV SOLN
INTRAVENOUS | Status: DC
Start: 2014-02-11 — End: 2014-02-11
  Administered 2014-02-11: 1000 mL via INTRAVENOUS
  Administered 2014-02-11: 15:00:00 via INTRAVENOUS

## 2014-02-11 MED ORDER — BUPIVACAINE LIPOSOME 1.3 % IJ SUSP
20.0000 mL | Freq: Once | INTRAMUSCULAR | Status: DC
  Filled 2014-02-11: qty 20

## 2014-02-11 MED ORDER — TRANEXAMIC ACID 100 MG/ML IV SOLN
1000.0000 mg | INTRAVENOUS | Status: AC
  Administered 2014-02-11: 1000 mg via INTRAVENOUS
  Filled 2014-02-11: qty 10

## 2014-02-11 MED ORDER — ACETAMINOPHEN 10 MG/ML IV SOLN
1000.0000 mg | Freq: Once | INTRAVENOUS | Status: AC
  Administered 2014-02-11: 1000 mg via INTRAVENOUS
  Filled 2014-02-11: qty 100

## 2014-02-11 MED ORDER — ONDANSETRON HCL 4 MG/2ML IJ SOLN
4.0000 mg | Freq: Four times a day (QID) | INTRAMUSCULAR | Status: DC | PRN
  Administered 2014-02-11 – 2014-02-13 (×4): 4 mg via INTRAVENOUS
  Filled 2014-02-11 (×4): qty 2

## 2014-02-11 MED ORDER — SODIUM CHLORIDE 0.9 % IV SOLN: INTRAVENOUS | Status: DC

## 2014-02-11 MED ORDER — FENTANYL CITRATE 0.05 MG/ML IJ SOLN
25.0000 ug | INTRAMUSCULAR | Status: DC | PRN
  Administered 2014-02-11: 25 ug via INTRAVENOUS

## 2014-02-11 MED ORDER — POLYETHYLENE GLYCOL 3350 17 G PO PACK: 17.0000 g | PACK | Freq: Every day | ORAL | Status: DC | PRN

## 2014-02-11 MED ORDER — LIDOCAINE HCL (CARDIAC) 20 MG/ML IV SOLN
INTRAVENOUS | Status: AC
  Filled 2014-02-11: qty 5

## 2014-02-11 MED ORDER — FENTANYL CITRATE 0.05 MG/ML IJ SOLN
INTRAMUSCULAR | Status: AC
  Filled 2014-02-11: qty 2

## 2014-02-11 MED ORDER — FENTANYL CITRATE 0.05 MG/ML IJ SOLN
INTRAMUSCULAR | Status: DC | PRN
  Administered 2014-02-11: 50 ug via INTRAVENOUS

## 2014-02-11 MED ORDER — PROMETHAZINE HCL 25 MG/ML IJ SOLN: 6.2500 mg | INTRAMUSCULAR | Status: DC | PRN

## 2014-02-11 MED ORDER — BUPIVACAINE HCL (PF) 0.5 % IJ SOLN
INTRAMUSCULAR | Status: DC | PRN
  Administered 2014-02-11: 3 mL

## 2014-02-11 MED ORDER — VANCOMYCIN HCL IN DEXTROSE 1-5 GM/200ML-% IV SOLN
1000.0000 mg | Freq: Two times a day (BID) | INTRAVENOUS | Status: DC
  Filled 2014-02-11: qty 200

## 2014-02-11 MED ORDER — ACETAMINOPHEN 650 MG RE SUPP: 650.0000 mg | Freq: Four times a day (QID) | RECTAL | Status: DC | PRN

## 2014-02-11 MED ORDER — KETOROLAC TROMETHAMINE 15 MG/ML IJ SOLN
7.5000 mg | Freq: Four times a day (QID) | INTRAMUSCULAR | Status: AC | PRN
  Administered 2014-02-12: 7.5 mg via INTRAVENOUS
  Filled 2014-02-11: qty 1

## 2014-02-11 MED ORDER — METOCLOPRAMIDE HCL 10 MG PO TABS: 5.0000 mg | ORAL_TABLET | Freq: Three times a day (TID) | ORAL | Status: DC | PRN

## 2014-02-11 MED ORDER — 0.9 % SODIUM CHLORIDE (POUR BTL) OPTIME
TOPICAL | Status: DC | PRN
  Administered 2014-02-11: 1000 mL

## 2014-02-11 MED ORDER — SODIUM CHLORIDE 0.9 % IJ SOLN
INTRAMUSCULAR | Status: AC
  Filled 2014-02-11: qty 50

## 2014-02-11 MED ORDER — PROPOFOL INFUSION 10 MG/ML OPTIME
INTRAVENOUS | Status: DC | PRN
Start: 2014-02-11 — End: 2014-02-11
  Administered 2014-02-11: 75 ug/kg/min via INTRAVENOUS

## 2014-02-11 MED ORDER — BISACODYL 10 MG RE SUPP: 10.0000 mg | Freq: Every day | RECTAL | Status: DC | PRN

## 2014-02-11 MED ORDER — VANCOMYCIN HCL IN DEXTROSE 1-5 GM/200ML-% IV SOLN
INTRAVENOUS | Status: AC
  Filled 2014-02-11: qty 200

## 2014-02-11 MED ORDER — CHLORHEXIDINE GLUCONATE 4 % EX LIQD: 60.0000 mL | Freq: Once | CUTANEOUS | Status: DC

## 2014-02-11 MED ORDER — PHENYLEPHRINE HCL 10 MG/ML IJ SOLN
INTRAMUSCULAR | Status: AC
  Filled 2014-02-11: qty 1

## 2014-02-11 MED ORDER — DIPHENHYDRAMINE HCL 12.5 MG/5ML PO ELIX: 12.5000 mg | ORAL_SOLUTION | ORAL | Status: DC | PRN

## 2014-02-11 MED ORDER — DOCUSATE SODIUM 100 MG PO CAPS
100.0000 mg | ORAL_CAPSULE | Freq: Two times a day (BID) | ORAL | Status: DC
  Administered 2014-02-11 – 2014-02-13 (×4): 100 mg via ORAL

## 2014-02-11 MED ORDER — MORPHINE SULFATE 2 MG/ML IJ SOLN
1.0000 mg | INTRAMUSCULAR | Status: DC | PRN
  Administered 2014-02-12: 2 mg via INTRAVENOUS
  Filled 2014-02-11: qty 1

## 2014-02-11 MED ORDER — MENTHOL 3 MG MT LOZG: 1.0000 | LOZENGE | OROMUCOSAL | Status: DC | PRN

## 2014-02-11 MED ORDER — FLEET ENEMA 7-19 GM/118ML RE ENEM: 1.0000 | ENEMA | Freq: Once | RECTAL | Status: AC | PRN

## 2014-02-11 MED ORDER — METHOCARBAMOL 1000 MG/10ML IJ SOLN
500.0000 mg | Freq: Four times a day (QID) | INTRAVENOUS | Status: DC | PRN
  Administered 2014-02-12: 500 mg via INTRAVENOUS
  Filled 2014-02-11 (×2): qty 5

## 2014-02-11 MED ORDER — OXYCODONE HCL 5 MG PO TABS
5.0000 mg | ORAL_TABLET | ORAL | Status: DC | PRN
  Administered 2014-02-11 – 2014-02-13 (×4): 10 mg via ORAL
  Filled 2014-02-11 (×4): qty 2

## 2014-02-11 MED ORDER — METHOCARBAMOL 500 MG PO TABS
500.0000 mg | ORAL_TABLET | Freq: Four times a day (QID) | ORAL | Status: DC | PRN
  Administered 2014-02-11 – 2014-02-13 (×3): 500 mg via ORAL
  Filled 2014-02-11 (×3): qty 1

## 2014-02-11 MED ORDER — PHENYLEPHRINE HCL 10 MG/ML IJ SOLN
10.0000 mg | INTRAVENOUS | Status: DC | PRN
  Administered 2014-02-11: 40 ug/min via INTRAVENOUS

## 2014-02-11 MED ORDER — VANCOMYCIN HCL IN DEXTROSE 1-5 GM/200ML-% IV SOLN
1000.0000 mg | INTRAVENOUS | Status: AC
  Administered 2014-02-11: 1000 mg via INTRAVENOUS

## 2014-02-11 MED ORDER — ONDANSETRON HCL 4 MG/2ML IJ SOLN
INTRAMUSCULAR | Status: AC
  Filled 2014-02-11: qty 2

## 2014-02-11 MED ORDER — DEXAMETHASONE SODIUM PHOSPHATE 10 MG/ML IJ SOLN
10.0000 mg | Freq: Once | INTRAMUSCULAR | Status: AC
  Administered 2014-02-11: 10 mg via INTRAVENOUS

## 2014-02-11 SURGICAL SUPPLY — 42 items
BAG ZIPLOCK 12X15 (MISCELLANEOUS) ×3 IMPLANT
BLADE EXTENDED COATED 6.5IN (ELECTRODE) ×3 IMPLANT
BLADE SAW SGTL 18X1.27X75 (BLADE) ×2 IMPLANT
BLADE SAW SGTL 18X1.27X75MM (BLADE) ×1
CAPT HIP PF COP ×3 IMPLANT
CLOSURE WOUND 1/2 X4 (GAUZE/BANDAGES/DRESSINGS)
COVER PERINEAL POST (MISCELLANEOUS) ×3 IMPLANT
DECANTER SPIKE VIAL GLASS SM (MISCELLANEOUS) IMPLANT
DRAPE C-ARM 42X120 X-RAY (DRAPES) ×3 IMPLANT
DRAPE STERI IOBAN 125X83 (DRAPES) ×3 IMPLANT
DRAPE U-SHAPE 47X51 STRL (DRAPES) ×9 IMPLANT
DRSG ADAPTIC 3X8 NADH LF (GAUZE/BANDAGES/DRESSINGS) ×3 IMPLANT
DRSG MEPILEX BORDER 4X4 (GAUZE/BANDAGES/DRESSINGS) ×3 IMPLANT
DRSG MEPILEX BORDER 4X8 (GAUZE/BANDAGES/DRESSINGS) ×3 IMPLANT
DURAPREP 26ML APPLICATOR (WOUND CARE) ×3 IMPLANT
ELECT REM PT RETURN 9FT ADLT (ELECTROSURGICAL) ×3
ELECTRODE REM PT RTRN 9FT ADLT (ELECTROSURGICAL) ×1 IMPLANT
EVACUATOR 1/8 PVC DRAIN (DRAIN) IMPLANT
FACESHIELD WRAPAROUND (MASK) ×12 IMPLANT
GAUZE SPONGE 4X4 12PLY STRL (GAUZE/BANDAGES/DRESSINGS) IMPLANT
GLOVE BIO SURGEON STRL SZ7.5 (GLOVE) ×3 IMPLANT
GLOVE BIO SURGEON STRL SZ8 (GLOVE) ×6 IMPLANT
GLOVE BIOGEL PI IND STRL 8 (GLOVE) ×2 IMPLANT
GLOVE BIOGEL PI INDICATOR 8 (GLOVE) ×4
GOWN STRL REUS W/TWL LRG LVL3 (GOWN DISPOSABLE) ×3 IMPLANT
GOWN STRL REUS W/TWL XL LVL3 (GOWN DISPOSABLE) ×3 IMPLANT
KIT BASIN OR (CUSTOM PROCEDURE TRAY) ×3 IMPLANT
NDL SAFETY ECLIPSE 18X1.5 (NEEDLE) ×2 IMPLANT
NEEDLE HYPO 18GX1.5 SHARP (NEEDLE) ×4
PACK TOTAL JOINT (CUSTOM PROCEDURE TRAY) ×3 IMPLANT
PADDING CAST COTTON 6X4 STRL (CAST SUPPLIES) IMPLANT
STRIP CLOSURE SKIN 1/2X4 (GAUZE/BANDAGES/DRESSINGS) IMPLANT
SUCTION FRAZIER 12FR DISP (SUCTIONS) IMPLANT
SUT ETHIBOND NAB CT1 #1 30IN (SUTURE) ×3 IMPLANT
SUT MNCRL AB 4-0 PS2 18 (SUTURE) ×3 IMPLANT
SUT VIC AB 2-0 CT1 27 (SUTURE) ×4
SUT VIC AB 2-0 CT1 TAPERPNT 27 (SUTURE) ×2 IMPLANT
SUT VLOC 180 0 24IN GS25 (SUTURE) ×3 IMPLANT
SYRINGE 20CC LL (MISCELLANEOUS) ×3 IMPLANT
SYRINGE 60CC LL (MISCELLANEOUS) ×3 IMPLANT
TOWEL OR 17X26 10 PK STRL BLUE (TOWEL DISPOSABLE) ×3 IMPLANT
TRAY FOLEY CATH 14FRSI W/METER (CATHETERS) ×3 IMPLANT

## 2014-02-11 NOTE — Transfer of Care (Signed)
Immediate Anesthesia Transfer of Care Note  Patient: Joshua Cabrera  Procedure(s) Performed: Procedure(s) (LRB): LEFT TOTAL HIP ARTHROPLASTY ANTERIOR APPROACH (Left)  Patient Location: PACU  Anesthesia Type: Spinal  Level of Consciousness: sedated, patient cooperative and responds to stimulation  Airway & Oxygen Therapy: Patient Spontanous Breathing and Patient connected to face mask oxgen  Post-op Assessment: Report given to PACU RN and Post -op Vital signs reviewed and stable  Post vital signs: Reviewed and stable Noted T-12 level on exam denied pain on assessment.   Complications: No apparent anesthesia complications

## 2014-02-11 NOTE — Anesthesia Procedure Notes (Signed)
Spinal  Patient location during procedure: OR Start time: 02/11/2014 2:07 PM End time: 02/11/2014 2:13 PM Staffing CRNA/Resident: Anne Fu Performed by: resident/CRNA  Preanesthetic Checklist Completed: patient identified, site marked, surgical consent, pre-op evaluation, timeout performed, IV checked, risks and benefits discussed and monitors and equipment checked Spinal Block Patient position: sitting Prep: Betadine Patient monitoring: heart rate, continuous pulse ox and blood pressure Approach: right paramedian Location: L2-3 Injection technique: single-shot Needle Needle type: Whitacre  Needle gauge: 25 G Needle length: 9 cm Assessment Sensory level: T6 Additional Notes Expiration date of kit checked and confirmed. Patient tolerated procedure well, without complications. X 1 attempt with noted clear CSF noted easy aspiration and administration.  Noted loss of motor and sensory to lower ext. On exam.

## 2014-02-11 NOTE — Op Note (Signed)
OPERATIVE REPORT  PREOPERATIVE DIAGNOSIS: Osteoarthritis of the Left hip.   POSTOPERATIVE DIAGNOSIS: Osteoarthritis of the Left  hip.   PROCEDURE: Left total hip arthroplasty, anterior approach.   SURGEON: Gaynelle Arabian, MD   ASSISTANT: Arlee Muslim, PA-C  ANESTHESIA:  Spinal  ESTIMATED BLOOD LOSS:- 300 ml  DRAINS: Hemovac x1.   COMPLICATIONS: None   CONDITION: PACU - hemodynamically stable.   BRIEF CLINICAL NOTE: Joshua Cabrera is a 58 y.o. male who has advanced end-  stage arthritis of his Left  hip with progressively worsening pain and  dysfunction.The patient has failed nonoperative management and presents for  total hip arthroplasty.   PROCEDURE IN DETAIL: After successful administration of spinal  anesthetic, the traction boots for the [facility] bed were placed on both  feet and the patient was placed onto the [facility] bed, boots placed into the leg  holders. The Left hip was then isolated from the perineum with plastic  drapes and prepped and draped in the usual sterile fashion. ASIS and  greater trochanter were marked and a oblique incision was made, starting  at about 1 cm lateral and 2 cm distal to the ASIS and coursing towards  the anterior cortex of the femur. The skin was cut with a 10 blade  through subcutaneous tissue to the level of the fascia overlying the  tensor fascia lata muscle. The fascia was then incised in line with the  incision at the junction of the anterior third and posterior 2/3rd. The  muscle was teased off the fascia and then the interval between the TFL  and the rectus was developed. The Hohmann retractor was then placed at  the top of the femoral neck over the capsule. The vessels overlying the  capsule were cauterized and the fat on top of the capsule was removed.  A Hohmann retractor was then placed anterior underneath the rectus  femoris to give exposure to the entire anterior capsule. A T-shaped  capsulotomy was performed. The edges  were tagged and the femoral head  was identified.       Osteophytes are removed off the superior acetabulum.  The femoral neck was then cut in situ with an oscillating saw. Traction  was then applied to the left lower extremity utilizing the [facility]  traction. The femoral head was then removed. Retractors were placed  around the acetabulum and then circumferential removal of the labrum was  performed. Osteophytes were also removed. Reaming starts at 47 mm to  medialize and  Increased in 2 mm increments to 53 mm. We reamed in  approximately 40 degrees of abduction, 20 degrees anteversion. He had a large  superior acetabular cyst which was packed with cancellous reamings after the  cyst cavity and contents were removed with a curette. A 54 mm  pinnacle acetabular shell was then impacted in anatomic position under  fluoroscopic guidance with excellent purchase. We did not need to place  any additional dome screws. A 36 mm neutral + 4 marathon liner was then  placed into the acetabular shell.       The femoral lift was then placed along the lateral aspect of the femur  just distal to the vastus ridge. The leg was  externally rotated and capsule  was stripped off the inferior aspect of the femoral neck down to the  level of the lesser trochanter, this was done with electrocautery. The femur was lifted after this was performed. The  leg  was then placed and extended in adducted position to essentially delivering the femur. We also removed the capsule superiorly and the  piriformis from the piriformis fossa to gain excellent exposure of the  proximal femur. Rongeur was used to remove some cancellous bone to get  into the lateral portion of the proximal femur for placement of the  initial starter reamer. The starter broaches was placed  the starter broach  and was shown to go down the center of the canal. Broaching  with the  Corail system was then performed starting at size 8, coursing  Up to size 11.  A size 11 had excellent torsional and rotational  and axial stability. The trial standard offset neck was then placed  with a 36 + 1.5 trial head. The hip was then reduced. We confirmed that  the stem was in the canal both on AP and lateral x-rays. It also has excellent sizing. The hip was reduced with outstanding stability through full extension, full external rotation,  and then flexion in adduction internal rotation. AP pelvis was taken  and the leg lengths were measured and found to be exactly equal. Hip  was then dislocated again and the femoral head and neck removed. The  femoral broach was removed. Size 11 Corail stem with a standard offset  neck was then impacted into the femur following native anteversion. Has  excellent purchase in the canal. Excellent torsional and rotational and  axial stability. It is confirmed to be in the canal on AP and lateral  fluoroscopic views. The 36 + 1.5 ceramic head was placed and the hip  reduced with outstanding stability. Again AP pelvis was taken and it  confirmed that the leg lengths were equal. The wound was then copiously  irrigated with saline solution and the capsule reattached and repaired  with Ethibond suture.  20 mL of Exparel mixed with 50 mL of saline then additional 20 ml of .25% Bupivicaine injected into the capsule and into the edge of the tensor fascia lata as well as subcutaneous tissue. The fascia overlying the tensor fascia lata was  then closed with a running #1 V-Loc. Subcu was closed with interrupted  2-0 Vicryl and subcuticular running 4-0 Monocryl. Incision was cleaned  and dried. Steri-Strips and a bulky sterile dressing applied. Hemovac  drain was hooked to suction and then he was awakened and transported to  recovery in stable condition.        Please note that a surgical assistant was a medical necessity for this procedure to perform it in a safe and expeditious manner. Assistant was necessary to provide appropriate  retraction of vital neurovascular structures and to prevent femoral fracture and allow for anatomic placement of the prosthesis.  Gaynelle Arabian, M.D.

## 2014-02-11 NOTE — Interval H&P Note (Signed)
History and Physical Interval Note:  02/11/2014 1:06 PM  Joshua Cabrera  has presented today for surgery, with the diagnosis of OA OF LEFT HIP  The various methods of treatment have been discussed with the patient and family. After consideration of risks, benefits and other options for treatment, the patient has consented to  Procedure(s): LEFT TOTAL HIP ARTHROPLASTY ANTERIOR APPROACH (Left) as a surgical intervention .  The patient's history has been reviewed, patient examined, no change in status, stable for surgery.  I have reviewed the patient's chart and labs.  Questions were answered to the patient's satisfaction.     Gearlean Alf

## 2014-02-11 NOTE — Progress Notes (Signed)
Called OR for early pick up due to Vancomycin.

## 2014-02-11 NOTE — H&P (View-Only) (Signed)
Joshua Cabrera DOB: [birth year] Married / Language: Cleophus Molt / Race: White Male  Date of Admission:  02-11-2014  Chief Complaint:  Left Hip Pain  History of Present Illness The patient is a 58 year old male who comes in for a preoperative History and Physical. The patient is scheduled for a left total hip arthroplasty (anterior approach) to be performed by Dr. Dione Plover. Aluisio, MD at [facility] on 02-11-2014. The patient is a 58 year old male who presents for follow up of their hip. The patient is being followed for their left hip pain and osteoarthritis. Symptoms reported today include: pain, aching, stiffness and difficulty ambulating. The patient feels that they are doing poorly and report their pain level to be moderate to severe. Current treatment includes: use of cane. The following medication has been used for pain control: antiinflammatory medication (ibuprofen). The patient indicates that they are ready to proceed with a total hip arthroplasty. He is scheduled for 02/11/14. He is ready to proceed with surgery. He has severe pain in his left hip. It limits what he can and cannot do. It's been going on for years, getting progressively worse over time. They have been treated conservatively in the past for the above stated problem and despite conservative measures, they continue to have progressive pain and severe functional limitations and dysfunction. They have failed non-operative management including home exercise, medications. It is felt that they would benefit from undergoing total joint replacement. Risks and benefits of the procedure have been discussed with the patient and they elect to proceed with surgery. There are no active contraindications to surgery such as ongoing infection or rapidly progressive neurological disease.   Allergies Penicillin VK *PENICILLINS*   Problem List/Past Medical Osteoarthritis of left hip, unspecified osteoarthritis type (715.95   M16.12) Right tennis elbow (726.32  M77.11) Sleep Apnea. (no official testing - patient volunteered information) Osteoarthritis Gastroesophageal Reflux Disease   Family History Depression. Paternal Grandfather. grandfather fathers side Mother. Deceased. Osteoarthritis. Maternal Grandmother, Mother, Sister. mother, sister and grandmother mothers side Cerebrovascular Accident. Maternal Grandfather, Maternal Grandmother, Mother. mother, grandmother mothers side and grandfather mothers side Father. Living.    Social History Illicit drug use. no Current drinker. 12/14/2013: Currently drinks beer, wine and hard liquor 8-14 times per week Children. 2 Number of flights of stairs before winded. greater than 5 Not under pain contract Tobacco use. Never smoker. never smoker Pain Contract. no Drug/Alcohol Rehab (Currently). no Drug/Alcohol Rehab (Previously). no Alcohol use. current drinker; drinks beer, wine and hard liquor; 8-14 per week Current work status. working full time Exercise. Exercises rarely; does running / walking No history of drug/alcohol rehab Marital status. married Living situation. live with spouse    Medication History( Famotidine (10MG  Tablet, Oral) Active. Aspirin EC (81MG  Tablet DR, Oral) Active. Multivitamin ( Oral) Active. Ibuprofen (200MG  Tablet, Oral) Active. (600mg  in the am, then as needed) Hydroxyurea (500MG  Capsule, 2 tabs daily Oral) Active. Melatonin ( Oral) Specific dose unknown - Active.    Past Surgical History Arthroscopy of Shoulder. bilateral Rotator Cuff Repair. bilateral    Review of Systems General:Not Present- Chills, Fever, Night Sweats, Fatigue, Weight Gain, Weight Loss and Memory Loss. Skin:Not Present- Hives, Itching, Rash, Eczema and Lesions. HEENT:Not Present- Tinnitus, Headache, Double Vision, Visual Loss, Hearing Loss and Dentures. Respiratory:Not Present- Shortness of breath with  exertion, Shortness of breath at rest, Allergies, Coughing up blood and Chronic Cough. Cardiovascular:Not Present- Chest Pain, Racing/skipping heartbeats, Difficulty Breathing Lying Down, Murmur, Swelling and Palpitations. Gastrointestinal:Not  Present- Bloody Stool, Heartburn, Abdominal Pain, Vomiting, Nausea, Constipation, Diarrhea, Difficulty Swallowing, Jaundice and Loss of appetitie. Male Genitourinary:Not Present- Urinary frequency, Blood in Urine, Weak urinary stream, Discharge, Flank Pain, Incontinence, Painful Urination, Urgency, Urinary Retention and Urinating at Night. Musculoskeletal:Present- Joint Pain. Not Present- Muscle Weakness, Muscle Pain, Joint Swelling, Back Pain, Morning Stiffness and Spasms. Neurological:Not Present- Tremor, Dizziness, Blackout spells, Paralysis, Difficulty with balance and Weakness. Psychiatric:Not Present- Insomnia.    Vitals Weight: 168 lb Height: 70 in Weight was reported by patient. Height was reported by patient. Body Surface Area: 1.94 m Body Mass Index: 24.11 kg/m BP: 112/60 (Sitting, Left Arm, Standard)     Physical Exam The physical exam findings are as follows:   General Mental Status - Alert, cooperative and good historian. General Appearance- pleasant. Not in acute distress. Orientation- Oriented X3. Build & Nutrition- Well nourished and Well developed.   Head and Neck Head- normocephalic, atraumatic . Neck Global Assessment- supple. no bruit auscultated on the right and no bruit auscultated on the left.   Eye Vision- Wears corrective lenses. Pupil- Bilateral- Regular and Round. Motion- Bilateral- EOMI.   Chest and Lung Exam Auscultation: Breath sounds:- clear at anterior chest wall and - clear at posterior chest wall. Adventitious sounds:- No Adventitious sounds.   Cardiovascular Auscultation:Rhythm- Regular rate and rhythm. Heart Sounds- S1 WNL and S2 WNL. Murmurs & Other  Heart Sounds:Auscultation of the heart reveals - No Murmurs.   Abdomen Palpation/Percussion:Tenderness- Abdomen is non-tender to palpation. Rigidity (guarding)- Abdomen is soft. Auscultation:Auscultation of the abdomen reveals - Bowel sounds normal.   Male Genitourinary Not done, not pertinent to present illness  Musculoskeletal On exam, he's in no distress. His left hip can be flexed to 90. No internal or external rotation. Only about 20 degrees of abduction.  RADIOGRAPHS: His radiographs are reviewed. He has severe end stage bone on bone arthritis, left hip, with a large subchondral cyst.   Assessment & Plan Osteoarthritis of left hip, unspecified osteoarthritis type (715.95  M16.12) Impression: Left Hip  Note: Plan is for a Left Total Hip Replacement - anterior approach by Dr. Wynelle Link.  Plan is to go home.  PCP - Dr. Hulan Fess  The patient does not have any contraindications and will receive TXA (tranexamic acid) prior to surgery.  Signed electronically by Joelene Millin, III PA-C

## 2014-02-11 NOTE — Anesthesia Preprocedure Evaluation (Signed)
Anesthesia Evaluation  Patient identified by MRN, date of birth, ID band Patient awake    Reviewed: Allergy & Precautions, H&P , NPO status , Patient's Chart, lab work & pertinent test results  Airway Mallampati: I TM Distance: >3 FB Neck ROM: Full    Dental no notable dental hx.    Pulmonary neg pulmonary ROS,  breath sounds clear to auscultation  Pulmonary exam normal       Cardiovascular negative cardio ROS  Rhythm:Regular Rate:Normal     Neuro/Psych negative neurological ROS  negative psych ROS   GI/Hepatic negative GI ROS, Neg liver ROS,   Endo/Other  negative endocrine ROS  Renal/GU negative Renal ROS  negative genitourinary   Musculoskeletal negative musculoskeletal ROS (+)   Abdominal   Peds negative pediatric ROS (+)  Hematology essential hyperthrombocytosis   Anesthesia Other Findings   Reproductive/Obstetrics negative OB ROS                           Anesthesia Physical Anesthesia Plan  ASA: II  Anesthesia Plan: Spinal   Post-op Pain Management:    Induction:   Airway Management Planned: Simple Face Mask  Additional Equipment:   Intra-op Plan:   Post-operative Plan:   Informed Consent: I have reviewed the patients History and Physical, chart, labs and discussed the procedure including the risks, benefits and alternatives for the proposed anesthesia with the patient or authorized representative who has indicated his/her understanding and acceptance.   Dental advisory given  Plan Discussed with: CRNA  Anesthesia Plan Comments:         Anesthesia Quick Evaluation

## 2014-02-11 NOTE — Anesthesia Postprocedure Evaluation (Addendum)
  Anesthesia Post-op Note  Patient: Joshua Cabrera  Procedure(s) Performed: Procedure(s) (LRB): LEFT TOTAL HIP ARTHROPLASTY ANTERIOR APPROACH (Left)  Patient Location: PACU  Anesthesia Type: spinal   Level of Consciousness: awake and alert   Airway and Oxygen Therapy: Patient Spontanous Breathing  Post-op Pain: mild  Post-op Assessment: Post-op Vital signs reviewed, Patient's Cardiovascular Status Stable, Respiratory Function Stable, Patent Airway and No signs of Nausea or vomiting  Last Vitals:  Filed Vitals:   02/11/14 1825  BP: 108/69  Pulse: 72  Temp: 36.3 C  Resp: 16    Post-op Vital Signs: stable   Complications: No apparent anesthesia complications

## 2014-02-12 ENCOUNTER — Encounter (HOSPITAL_COMMUNITY): Payer: Self-pay | Admitting: Orthopedic Surgery

## 2014-02-12 DIAGNOSIS — E871 Hypo-osmolality and hyponatremia: Secondary | ICD-10-CM | POA: Diagnosis not present

## 2014-02-12 LAB — BASIC METABOLIC PANEL
BUN: 12 mg/dL (ref 6–23)
CO2: 26 mEq/L (ref 19–32)
Calcium: 8.6 mg/dL (ref 8.4–10.5)
Chloride: 99 mEq/L (ref 96–112)
Creatinine, Ser: 0.64 mg/dL (ref 0.50–1.35)
GFR calc non Af Amer: >90 mL/min (ref >90)
Glucose, Bld: 183 mg/dL — ABNORMAL HIGH (ref 70–99)
Potassium: 4.1 mEq/L (ref 3.7–5.3)
Sodium: 134 mEq/L — ABNORMAL LOW (ref 137–147)

## 2014-02-12 LAB — CBC
HEMATOCRIT: 40.7 % (ref 39.0–52.0)
Hemoglobin: 14.1 g/dL (ref 13.0–17.0)
MCH: 31.8 pg (ref 26.0–34.0)
MCHC: 34.6 g/dL (ref 30.0–36.0)
MCV: 91.9 fL (ref 78.0–100.0)
PLATELETS: 519 10*3/uL — AB (ref 150–400)
RBC: 4.43 MIL/uL (ref 4.22–5.81)
RDW: 14.8 % (ref 11.5–15.5)
WBC: 16.1 10*3/uL — AB (ref 4.0–10.5)

## 2014-02-12 LAB — GLUCOSE, CAPILLARY: Glucose-Capillary: 162 mg/dL — ABNORMAL HIGH (ref 70–99)

## 2014-02-12 MED ORDER — VANCOMYCIN HCL 1000 MG IV SOLR
1000.0000 mg | Freq: Once | INTRAVENOUS | Status: AC
  Administered 2014-02-12: 1000 mg via INTRAVENOUS
  Filled 2014-02-12: qty 1000

## 2014-02-12 MED ORDER — SODIUM CHLORIDE 0.9 % IV BOLUS (SEPSIS)
500.0000 mL | Freq: Once | INTRAVENOUS | Status: AC
  Administered 2014-02-12: 500 mL via INTRAVENOUS

## 2014-02-12 MED ORDER — CHLORPROMAZINE HCL 25 MG PO TABS
25.0000 mg | ORAL_TABLET | Freq: Three times a day (TID) | ORAL | Status: DC | PRN
  Administered 2014-02-12: 25 mg via ORAL
  Filled 2014-02-12: qty 1

## 2014-02-12 NOTE — Evaluation (Signed)
Physical Therapy Evaluation Patient Details Name: Joshua Cabrera MRN: [id number] DOB: July 24, 1956 Today's Date: 02/12/2014   History of Present Illness  s/p L  DA THA  Clinical Impression  Pt will benefit from PT to address deficits below;  Plan is for HHPT; limited by nausea at time of eval, RN aware    Follow Up Recommendations Home health PT    Equipment Recommendations  Rolling walker with 5" wheels    Recommendations for Other Services       Precautions / Restrictions Precautions Precautions: None Restrictions Other Position/Activity Restrictions: WBAT      Mobility  Bed Mobility Overal bed mobility: Needs Assistance Bed Mobility: Supine to Sit     Supine to sit: Min assist     General bed mobility comments: assist with   Transfers Overall transfer level: Needs assistance Equipment used: Rolling walker (2 wheeled) Transfers: Sit to/from Stand Sit to Stand: Min assist         General transfer comment: cues for hand placement and LLE position  Ambulation/Gait Ambulation/Gait assistance: Min assist Ambulation Distance (Feet): 15 Feet Assistive device: Rolling walker (2 wheeled) Gait Pattern/deviations: Step-to pattern;Trunk flexed Gait velocity: decr   General Gait Details: cues for sequence, RW position, posture  Stairs            Wheelchair Mobility    Modified Rankin (Stroke Patients Only)       Balance                                             Pertinent Vitals/Pain Pain not rated but c/o left hip pain, was premedicated;     Home Living Family/patient expects to be discharged to:: Private residence Living Arrangements: Spouse/significant other   Type of Home: House Home Access: Stairs to enter   [employer] of Steps: 6 with rail(s) or 2 on the inside Home Layout: Laundry or work area in [place]: Joshua Cabrera - single point Additional Comments: wife had recent rotator cuff surgery. 18yo  child that will help, drive etc.    Prior Function Level of Independence: Independent               Hand Dominance        Extremity/Trunk Assessment   Upper Extremity Assessment: Defer to OT evaluation           Lower Extremity Assessment: LLE deficits/detail RLE Deficits / Details: grossly WFL for activities tested;       Communication   Communication: No difficulties  Cognition Arousal/Alertness: Awake/alert Behavior During Therapy: WFL for tasks assessed/performed Overall Cognitive Status: Within Functional Limits for tasks assessed                      General Comments      Exercises Total Joint Exercises Ankle Circles/Pumps: AROM;Both;10 reps Quad Sets: AROM;Both;5 reps      Assessment/Plan    PT Assessment Patient needs continued PT services  PT Diagnosis Difficulty walking   PT Problem List Decreased strength;Decreased activity tolerance;Decreased balance;Decreased mobility;Decreased knowledge of use of DME  PT Treatment Interventions DME instruction;Gait training;Functional mobility training;Therapeutic activities;Therapeutic exercise;Patient/family education;Stair training   PT Goals (Current goals can be found in the Care Plan section) Acute Rehab PT Goals Patient Stated Goal: I  PT Goal Formulation: With patient Time For Goal Achievement: 02/19/14 Potential to Achieve Goals: Good  Frequency 7X/week   Barriers to discharge        Co-evaluation               End of Session Equipment Utilized During Treatment: Gait belt Activity Tolerance: Patient limited by fatigue;Other (comment) (limited by nausea) Patient left: in bed;with call bell/phone within reach;with family/visitor present Nurse Communication: Mobility status         Time: 3419-3790 PT Time Calculation (min): 27 min   Charges:   PT Evaluation $Initial PT Evaluation Tier I: 1 Procedure PT Treatments $Gait Training: 8-22 mins $Therapeutic Activity:  8-22 mins   PT G Codes:          Joshua Cabrera Mar 02, 2014, 4:01 PM

## 2014-02-12 NOTE — Progress Notes (Signed)
Notified Bryson PA on call about pt. Labs have been drawn. Results are pending. He stated to monitor pt and call him back if he gets worse. Will continue to monitor.

## 2014-02-12 NOTE — Progress Notes (Signed)
02/12/14 1600  PT Visit Information  Last PT Received On 02/12/14  Assistance Needed +1  History of Present Illness s/p L  DA THA  PT Time Calculation  PT Start Time 1428  PT Stop Time 1455  PT Time Calculation (min) 27 min  Precautions  Precautions None  Restrictions  Other Position/Activity Restrictions WBAT  Cognition  Arousal/Alertness Awake/alert  Behavior During Therapy WFL for tasks assessed/performed  Overall Cognitive Status Within Functional Limits for tasks assessed  Bed Mobility  Overal bed mobility Needs Assistance  Bed Mobility Sit to Supine  Sit to supine Min assist  General bed mobility comments assist with LLE  Transfers  Overall transfer level Needs assistance  Equipment used Rolling walker (2 wheeled)  Transfers Sit to/from Stand  Sit to Stand Min guard  General transfer comment cues for hand placement and LLE position  Ambulation/Gait  Ambulation/Gait assistance Min guard  Ambulation Distance (Feet) 45 Feet  Assistive device Rolling walker (2 wheeled)  Gait Pattern/deviations Step-to pattern;Decreased stance time - left;Decreased step length - right;Decreased step length - left;Trunk flexed  Gait velocity decr  General Gait Details cues for sequence, RW position, posture; qable to achieve incr trunk flexion and incr WBing onLLE this pm  Total Joint Exercises  Ankle Circles/Pumps AROM;Both;10 reps  Quad Sets AROM;Strengthening;Both;10 reps  Heel Slides AROM;Left;10 reps  PT - End of Session  Equipment Utilized During Treatment Gait belt  Activity Tolerance Patient tolerated treatment well  Patient left in bed;with call bell/phone within reach;with family/visitor present  Nurse Communication Mobility status  PT - Assessment/Plan  PT Plan Current plan remains appropriate  PT Frequency 7X/week  Follow Up Recommendations Home health PT  PT equipment Rolling walker with 5" wheels  PT Goal Progression  Progress towards PT goals Progressing toward goals   Acute Rehab PT Goals  PT Goal Formulation With patient  Time For Goal Achievement 02/19/14  Potential to Achieve Goals Good  PT General Charges  $$ ACUTE PT VISIT 1 Procedure  PT Treatments  $Gait Training 8-22 mins  $Therapeutic Exercise 8-22 mins

## 2014-02-12 NOTE — Progress Notes (Signed)
   Subjective: 1 Day Post-Op Procedure(s) (LRB): LEFT TOTAL HIP ARTHROPLASTY ANTERIOR APPROACH (Left) Patient reports pain as moderate.   Patient seen in rounds with Dr. Wynelle Link. Patient is having problems with pain in the hip, requiring pain medications.  He also had an episode earlier this morning with bradycardia, low pressure and nausea.  Treated with trendelenburg position and monitored.  Will give fluids this morning and antiemetic since still has nausea.  Will keep foley for now for strict monitoring for the next four hours. We will start therapy today.  Plan is to go Home after hospital stay.  Objective: Vital signs in last 24 hours: Temp:  [97.2 F (36.2 C)-97.7 F (36.5 C)] 97.3 F (36.3 C) (06/30 0520) Pulse Rate:  [50-78] 63 (06/30 0543) Resp:  [12-20] 20 (06/30 0725) BP: (96-120)/(56-73) 113/69 mmHg (06/30 0543) SpO2:  [94 %-100 %] 98 % (06/30 0725) Weight:  [75.751 kg (167 lb)] 75.751 kg (167 lb) (06/29 1715)  Intake/Output from previous day:  Intake/Output Summary (Last 24 hours) at 02/12/14 0752 Last data filed at 02/12/14 0200  Gross per 24 hour  Intake 3486.67 ml  Output   2810 ml  Net 676.67 ml    Intake/Output this shift: UOP 500 since MN  Labs:  Recent Labs  02/12/14 0530  HGB 14.1    Recent Labs  02/12/14 0530  WBC 16.1*  RBC 4.43  HCT 40.7  PLT 519*    Recent Labs  02/12/14 0530  NA 134*  K 4.1  CL 99  CO2 26  BUN 12  CREATININE 0.64  GLUCOSE 183*  CALCIUM 8.6   No results found for this basename: LABPT, INR,  in the last 72 hours  EXAM General - Patient is Alert, Appropriate and Oriented Extremity - Neurovascular intact Sensation intact distally Dorsiflexion/Plantar flexion intact Dressing - dressing C/D/I Motor Function - intact, moving foot and toes well on exam.  Hemovac pulled without difficulty.  Past Medical History  Diagnosis Date  . Pneumonia   . Thrombocytosis   . Complication of anesthesia   . PONV  (postoperative nausea and vomiting)   . Unspecified diseases of blood and blood-forming organs     "essential hyperthrombocytosis"  . GERD (gastroesophageal reflux disease)   . Arthritis     osteoarthritis -hips,spine,hands and left wrist.    Assessment/Plan: 1 Day Post-Op Procedure(s) (LRB): LEFT TOTAL HIP ARTHROPLASTY ANTERIOR APPROACH (Left) Principal Problem:   OA (osteoarthritis) of hip  Estimated body mass index is 23.96 kg/(m^2) as calculated from the following:   Height as of this encounter: 5\' 10"  (1.778 m).   Weight as of this encounter: 75.751 kg (167 lb). Advance diet Up with therapy Discharge home with home health Fluids this morning. Recheck BP after bolus. PRN nausea medications.  DVT Prophylaxis - Xarelto Weight Bearing As Tolerated left Leg Hemovac Pulled Begin Therapy  Arlee Muslim, PA-C Orthopaedic Surgery 02/12/2014, 7:52 AM

## 2014-02-12 NOTE — Progress Notes (Signed)
Pt returned to a semi reclined position. pt stated that he is feeling better. Current BP 113/69 HR 63. Will continue to monitor.

## 2014-02-12 NOTE — Progress Notes (Signed)
Pt stated that he felt like he passed out. Pt is diaphoretic, clammy, and nauseous. HR in 40s on pulse ox. Blood pressure 96/60. O2 sat stable on 2L. Blood glucose 162. Pt placed in trendelenburg position. Will continue to monitor. Will notify physician on call.

## 2014-02-12 NOTE — Progress Notes (Signed)
OT Cancellation Note  Patient Details Name: Joshua Cabrera MRN: [id number] DOB: May 28, 1956   Cancelled Treatment:    Reason Eval/Treat Not Completed: Other (comment).  Pt with nausea and is not leaving today.  Will check back tomorrow.  [name],[name] 02/12/2014, 12:57 PM Lesle Chris, OTR/L [phone] 02/12/2014

## 2014-02-12 NOTE — Discharge Instructions (Addendum)
°Dr. Frank Aluisio °Total Joint Specialist °[facility] °[address] °, [place] [address] °[phone] ° ° ° °ANTERIOR APPROACH TOTAL HIP REPLACEMENT POSTOPERATIVE DIRECTIONS ° ° °Hip Rehabilitation, Guidelines Following Surgery  °The results of a hip operation are greatly improved after range of motion and muscle strengthening exercises. Follow all safety measures which are given to protect your hip. If any of these exercises cause increased pain or swelling in your joint, decrease the amount until you are comfortable again. Then slowly increase the exercises. Call your caregiver if you have problems or questions.  °HOME CARE INSTRUCTIONS  °Most of the following instructions are designed to prevent the dislocation of your new hip.  °Remove items at home which could result in a fall. This includes throw rugs or furniture in walking pathways.  °Continue medications as instructed at time of discharge. °· You may have some home medications which will be placed on hold until you complete the course of blood thinner medication. °· You may start showering once you are discharged home but do not submerge the incision under water. Just pat the incision dry and apply a dry gauze dressing on daily. °Do not put on socks or shoes without following the instructions of your caregivers.  °Sit on high chairs which makes it easier to stand.  °Sit on chairs with arms. Use the chair arms to help push yourself up when arising.  °Keep your leg on the side of the operation out in front of you when standing up.  °Arrange for the use of a toilet seat elevator so you are not sitting low.   °· Walk with walker as instructed.  °You may resume a sexual relationship in one month or when given the OK by your caregiver.  °Use walker as long as suggested by your caregivers.  °You may put full weight on your legs and walk as much as is comfortable. °Avoid periods of inactivity such as sitting longer than an hour  when not asleep. This helps prevent blood clots.  °You may return to work once you are cleared by your surgeon.  °Do not drive a car for 6 weeks or until released by your surgeon.  °Do not drive while taking narcotics.  °Wear elastic stockings for three weeks following surgery during the day but you may remove then at night.  °Make sure you keep all of your appointments after your operation with all of your doctors and caregivers. You should call the office at the above phone number and make an appointment for approximately two weeks after the date of your surgery. °Change the dressing daily and reapply a dry dressing each time. °Please pick up a stool softener and laxative for home use as long as you are requiring pain medications. °· Continue to use ice on the hip for pain and swelling from surgery. You may notice swelling that will progress down to the foot and ankle.  This is normal after  surgery.  Elevate the leg when you are not up walking on it.   °It is important for you to complete the blood thinner medication as prescribed by your doctor. °· Continue to use the breathing machine which will help keep your temperature down.  It is common for your temperature to cycle up and down following surgery, especially at night when you are not up moving around and exerting yourself.  The breathing machine keeps your lungs expanded and your temperature down. ° °RANGE OF MOTION AND STRENGTHENING EXERCISES  °  These exercises are designed to help you keep full movement of your hip joint. Follow your caregiver's or physical therapist's instructions. Perform all exercises about fifteen times, three times per day or as directed. Exercise both hips, even if you have had only one joint replacement. These exercises can be done on a training (exercise) mat, on the floor, on a table or on a bed. Use whatever works the best and is most comfortable for you. Use music or television while you are exercising so that the exercises are  a pleasant break in your day. This will make your life better with the exercises acting as a break in routine you can look forward to.  Lying on your back, slowly slide your foot toward your buttocks, raising your knee up off the floor. Then slowly slide your foot back down until your leg is straight again.  Lying on your back spread your legs as far apart as you can without causing discomfort.  Lying on your side, raise your upper leg and foot straight up from the floor as far as is comfortable. Slowly lower the leg and repeat.  Lying on your back, tighten up the muscle in the front of your thigh (quadriceps muscles). You can do this by keeping your leg straight and trying to raise your heel off the floor. This helps strengthen the largest muscle supporting your knee.  Lying on your back, tighten up the muscles of your buttocks both with the legs straight and with the knee bent at a comfortable angle while keeping your heel on the floor.   SKILLED REHAB INSTRUCTIONS: If the patient is transferred to a skilled rehab facility following release from the hospital, a list of the current medications will be sent to the facility for the patient to continue.  When discharged from the skilled rehab facility, please have the facility set up the patient's [place] prior to being released. Also, the skilled facility will be responsible for providing the patient with their medications at time of release from the facility to include their pain medication, the muscle relaxants, and their blood thinner medication. If the patient is still at the rehab facility at time of the two week follow up appointment, the skilled rehab facility will also need to assist the patient in arranging follow up appointment in our office and any transportation needs.  MAKE SURE YOU:  Understand these instructions.  Will watch your condition.  Will get help right away if you are not doing well or get worse.  Pick up  stool softner and laxative for home. Do not submerge incision under water. May shower. Continue to use ice for pain and swelling from surgery. Total Hip Protocol.  Take Xarelto for two and a half more weeks, then discontinue Xarelto. Once the patient has completed the Xarelto, they may resume the 81 mg Aspirin.  Information on my medicine - XARELTO (Rivaroxaban)  This medication education was reviewed with me or my healthcare representative as part of my discharge preparation.  The pharmacist that spoke with me during my hospital stay was:  Emiliano Dyer, RPH  Why was Xarelto prescribed for you? Xarelto was prescribed for you to reduce the risk of blood clots forming after orthopedic surgery. The medical term for these abnormal blood clots is venous thromboembolism (VTE).  What do you need to know about xarelto ? Take your Xarelto ONCE DAILY at the same time every day. You may take it either with or without food.  If you have difficulty swallowing the tablet whole, you may crush it and mix in applesauce just prior to taking your dose.  Take Xarelto exactly as prescribed by your doctor and DO NOT stop taking Xarelto without talking to the doctor who prescribed the medication.  Stopping without other VTE prevention medication to take the place of Xarelto may increase your risk of developing a clot.  After discharge, you should have regular check-up appointments with your healthcare provider that is prescribing your Xarelto.    What do you do if you miss a dose? If you miss a dose, take it as soon as you remember on the same day then continue your regularly scheduled once daily regimen the next day. Do not take two doses of Xarelto on the same day.   Important Safety Information A possible side effect of Xarelto is bleeding. You should call your healthcare provider right away if you experience any of the following:   Bleeding from an injury or your nose that does not  stop.   Unusual colored urine (red or dark brown) or unusual colored stools (red or black).   Unusual bruising for unknown reasons.   A serious fall or if you hit your head (even if there is no bleeding).  Some medicines may interact with Xarelto and might increase your risk of bleeding while on Xarelto. To help avoid this, consult your healthcare provider or pharmacist prior to using any new prescription or non-prescription medications, including herbals, vitamins, non-steroidal anti-inflammatory drugs (NSAIDs) and supplements.  This website has more information on Xarelto: [email or web address]

## 2014-02-13 ENCOUNTER — Ambulatory Visit: Payer: Self-pay | Admitting: Internal Medicine

## 2014-02-13 LAB — CBC
HCT: 38 % — ABNORMAL LOW (ref 39.0–52.0)
HEMOGLOBIN: 13.2 g/dL (ref 13.0–17.0)
MCH: 31.7 pg (ref 26.0–34.0)
MCHC: 34.7 g/dL (ref 30.0–36.0)
MCV: 91.1 fL (ref 78.0–100.0)
Platelets: 506 10*3/uL — ABNORMAL HIGH (ref 150–400)
RBC: 4.17 MIL/uL — ABNORMAL LOW (ref 4.22–5.81)
RDW: 15 % (ref 11.5–15.5)
WBC: 12.8 10*3/uL — ABNORMAL HIGH (ref 4.0–10.5)

## 2014-02-13 LAB — BASIC METABOLIC PANEL
BUN: 10 mg/dL (ref 6–23)
CO2: 24 meq/L (ref 19–32)
Calcium: 8.9 mg/dL (ref 8.4–10.5)
Chloride: 104 mEq/L (ref 96–112)
Creatinine, Ser: 0.62 mg/dL (ref 0.50–1.35)
GFR calc Af Amer: >90 mL/min (ref >90)
GFR calc non Af Amer: >90 mL/min (ref >90)
GLUCOSE: 123 mg/dL — AB (ref 70–99)
POTASSIUM: 3.9 meq/L (ref 3.7–5.3)
SODIUM: 140 meq/L (ref 137–147)

## 2014-02-13 MED ORDER — CYCLOBENZAPRINE HCL 10 MG PO TABS: 10.0000 mg | ORAL_TABLET | Freq: Three times a day (TID) | ORAL | Status: DC | PRN

## 2014-02-13 MED ORDER — TRAMADOL HCL 50 MG PO TABS: 50.0000 mg | ORAL_TABLET | Freq: Four times a day (QID) | ORAL | Status: DC | PRN

## 2014-02-13 MED ORDER — ONDANSETRON HCL 4 MG PO TABS: 4.0000 mg | ORAL_TABLET | Freq: Four times a day (QID) | ORAL | Status: DC | PRN

## 2014-02-13 MED ORDER — OXYCODONE HCL 5 MG PO TABS: 5.0000 mg | ORAL_TABLET | ORAL | Status: DC | PRN

## 2014-02-13 MED ORDER — RIVAROXABAN 10 MG PO TABS: 10.0000 mg | ORAL_TABLET | Freq: Every day | ORAL | Status: DC

## 2014-02-13 MED ORDER — CHLORPROMAZINE HCL 25 MG PO TABS
25.0000 mg | ORAL_TABLET | Freq: Once | ORAL | Status: AC
  Administered 2014-02-13: 25 mg via ORAL
  Filled 2014-02-13: qty 1

## 2014-02-13 MED ORDER — CYCLOBENZAPRINE HCL 10 MG PO TABS
10.0000 mg | ORAL_TABLET | Freq: Three times a day (TID) | ORAL | Status: DC | PRN
  Administered 2014-02-13: 10 mg via ORAL
  Filled 2014-02-13: qty 1

## 2014-02-13 NOTE — Discharge Summary (Signed)
Physician Discharge Summary   Patient ID: Joshua Cabrera MRN: [id number] DOB/AGE: 1955/11/01 58 y.o.  Admit date: 02/11/2014 Discharge date: 02/13/2014  Primary Diagnosis:  Osteoarthritis of the Left hip.   Admission Diagnoses:  Past Medical History  Diagnosis Date  . Pneumonia   . Thrombocytosis   . Complication of anesthesia   . PONV (postoperative nausea and vomiting)   . Unspecified diseases of blood and blood-forming organs     "essential hyperthrombocytosis"  . GERD (gastroesophageal reflux disease)   . Arthritis     osteoarthritis -hips,spine,hands and left wrist.   Discharge Diagnoses:   Principal Problem:   OA (osteoarthritis) of hip Active Problems:   Polycythemia   Hyponatremia   Symptomatic bradycardia  Estimated body mass index is 23.96 kg/(m^2) as calculated from the following:   Height as of this encounter: _0  (1.778 m).   Weight as of this encounter: 75.751 kg (167 lb).  Procedure(s) (LRB): LEFT TOTAL HIP ARTHROPLASTY ANTERIOR APPROACH (Left)   Consults: None  HPI: Joshua Cabrera is a 58 y.o. male who has advanced end-  stage arthritis of his Left hip with progressively worsening pain and  dysfunction.The patient has failed nonoperative management and presents for  total hip arthroplasty.   Laboratory Data: Admission on 02/11/2014, Discharged on 02/13/2014  Component Date Value Ref Range Status  . WBC 02/12/2014 16.1* 4.0 - 10.5 K/uL Final  . RBC 02/12/2014 4.43  4.22 - 5.81 MIL/uL Final  . Hemoglobin 02/12/2014 14.1  13.0 - 17.0 g/dL Final  . HCT 02/12/2014 40.7  39.0 - 52.0 % Final  . MCV 02/12/2014 91.9  78.0 - 100.0 fL Final  . MCH 02/12/2014 31.8  26.0 - 34.0 pg Final  . MCHC 02/12/2014 34.6  30.0 - 36.0 g/dL Final  . RDW 02/12/2014 14.8  11.5 - 15.5 % Final  . Platelets 02/12/2014 519* 150 - 400 K/uL Final  . Sodium 02/12/2014 134* 137 - 147 mEq/L Final  . Potassium 02/12/2014 4.1  3.7 - 5.3 mEq/L Final  . Chloride 02/12/2014 99  96 - 112  mEq/L Final  . CO2 02/12/2014 26  19 - 32 mEq/L Final  . Glucose, Bld 02/12/2014 183* 70 - 99 mg/dL Final  . BUN 02/12/2014 12  6 - 23 mg/dL Final  . Creatinine, Ser 02/12/2014 0.64  0.50 - 1.35 mg/dL Final  . Calcium 02/12/2014 8.6  8.4 - 10.5 mg/dL Final  . GFR calc non Af Amer 02/12/2014 >90  >90 mL/min Final  . GFR calc Af Amer 02/12/2014 >90  >90 mL/min Final   Comment: (NOTE)                          The eGFR has been calculated using the CKD EPI equation.                          This calculation has not been validated in all clinical situations.                          eGFR's persistently <90 mL/min signify possible Chronic Kidney                          Disease.  . WBC 02/13/2014 12.8* 4.0 - 10.5 K/uL Final  . RBC 02/13/2014 4.17* 4.22 - 5.81 MIL/uL Final  . Hemoglobin 02/13/2014 13.2  13.0 - 17.0 g/dL Final  . HCT 02/13/2014 38.0* 39.0 - 52.0 % Final  . MCV 02/13/2014 91.1  78.0 - 100.0 fL Final  . MCH 02/13/2014 31.7  26.0 - 34.0 pg Final  . MCHC 02/13/2014 34.7  30.0 - 36.0 g/dL Final  . RDW 02/13/2014 15.0  11.5 - 15.5 % Final  . Platelets 02/13/2014 506* 150 - 400 K/uL Final  . Sodium 02/13/2014 140  137 - 147 mEq/L Final  . Potassium 02/13/2014 3.9  3.7 - 5.3 mEq/L Final  . Chloride 02/13/2014 104  96 - 112 mEq/L Final  . CO2 02/13/2014 24  19 - 32 mEq/L Final  . Glucose, Bld 02/13/2014 123* 70 - 99 mg/dL Final  . BUN 02/13/2014 10  6 - 23 mg/dL Final  . Creatinine, Ser 02/13/2014 0.62  0.50 - 1.35 mg/dL Final  . Calcium 02/13/2014 8.9  8.4 - 10.5 mg/dL Final  . GFR calc non Af Amer 02/13/2014 >90  >90 mL/min Final  . GFR calc Af Amer 02/13/2014 >90  >90 mL/min Final   Comment: (NOTE)                          The eGFR has been calculated using the CKD EPI equation.                          This calculation has not been validated in all clinical situations.                          eGFR's persistently <90 mL/min signify possible Chronic Kidney                           Disease.  . Glucose-Capillary 02/12/2014 162* 70 - 99 mg/dL Final  Hospital Outpatient Visit on 02/05/2014  Component Date Value Ref Range Status  . aPTT 02/05/2014 36  24 - 37 seconds Final  . WBC 02/05/2014 6.7  4.0 - 10.5 K/uL Final  . RBC 02/05/2014 4.60  4.22 - 5.81 MIL/uL Final  . Hemoglobin 02/05/2014 14.5  13.0 - 17.0 g/dL Final  . HCT 02/05/2014 42.0  39.0 - 52.0 % Final  . MCV 02/05/2014 91.3  78.0 - 100.0 fL Final  . MCH 02/05/2014 31.5  26.0 - 34.0 pg Final  . MCHC 02/05/2014 34.5  30.0 - 36.0 g/dL Final  . RDW 02/05/2014 14.5  11.5 - 15.5 % Final  . Platelets 02/05/2014 646* 150 - 400 K/uL Final  . Sodium 02/05/2014 140  137 - 147 mEq/L Final  . Potassium 02/05/2014 4.3  3.7 - 5.3 mEq/L Final  . Chloride 02/05/2014 101  96 - 112 mEq/L Final  . CO2 02/05/2014 26  19 - 32 mEq/L Final  . Glucose, Bld 02/05/2014 78  70 - 99 mg/dL Final  . BUN 02/05/2014 19  6 - 23 mg/dL Final  . Creatinine, Ser 02/05/2014 0.76  0.50 - 1.35 mg/dL Final  . Calcium 02/05/2014 9.1  8.4 - 10.5 mg/dL Final  . Total Protein 02/05/2014 6.5  6.0 - 8.3 g/dL Final  . Albumin 02/05/2014 3.6  3.5 - 5.2 g/dL Final  . AST 02/05/2014 22  0 - 37 U/L Final  . ALT 02/05/2014 14  0 - 53 U/L Final  . Alkaline Phosphatase 02/05/2014 74  39 - 117 U/L Final  . Total Bilirubin  02/05/2014 1.1  0.3 - 1.2 mg/dL Final  . GFR calc non Af Amer 02/05/2014 >90  >90 mL/min Final  . GFR calc Af Amer 02/05/2014 >90  >90 mL/min Final   Comment: (NOTE)                          The eGFR has been calculated using the CKD EPI equation.                          This calculation has not been validated in all clinical situations.                          eGFR's persistently <90 mL/min signify possible Chronic Kidney                          Disease.  Marland Kitchen Prothrombin Time 02/05/2014 12.4  11.6 - 15.2 seconds Final  . INR 02/05/2014 0.94  0.00 - 1.49 Final  . ABO/RH(D) 02/05/2014 O POS   Final  . Antibody Screen 02/05/2014 NEG    Final  . Sample Expiration 02/05/2014 02/14/2014   Final  . Color, Urine 02/05/2014 YELLOW  YELLOW Final  . APPearance 02/05/2014 CLEAR  CLEAR Final  . Specific Gravity, Urine 02/05/2014 1.020  1.005 - 1.030 Final  . pH 02/05/2014 7.0  5.0 - 8.0 Final  . Glucose, UA 02/05/2014 NEGATIVE  NEGATIVE mg/dL Final  . Hgb urine dipstick 02/05/2014 NEGATIVE  NEGATIVE Final  . Bilirubin Urine 02/05/2014 NEGATIVE  NEGATIVE Final  . Ketones, ur 02/05/2014 NEGATIVE  NEGATIVE mg/dL Final  . Protein, ur 02/05/2014 NEGATIVE  NEGATIVE mg/dL Final  . Urobilinogen, UA 02/05/2014 0.2  0.0 - 1.0 mg/dL Final  . Nitrite 02/05/2014 NEGATIVE  NEGATIVE Final  . Leukocytes, UA 02/05/2014 NEGATIVE  NEGATIVE Final   MICROSCOPIC NOT DONE ON URINES WITH NEGATIVE PROTEIN, BLOOD, LEUKOCYTES, NITRITE, OR GLUCOSE <1000 mg/dL.  Marland Kitchen MRSA, PCR 02/05/2014 NEGATIVE  NEGATIVE Final  . Staphylococcus aureus 02/05/2014 POSITIVE* NEGATIVE Final   Comment:                                 The Xpert SA Assay (FDA                          approved for NASAL specimens                          in patients over 68 years of age),                          is one component of                          a comprehensive surveillance                          program.  Test performance has                          been validated by [employer] for patients greater                          than or equal to 74 year old.                          It is not intended                          to diagnose infection nor to                          guide or monitor treatment.  . ABO/RH(D) 02/05/2014 O POS   Final     X-Rays:Dg Hip Complete Left  02/05/2014   CLINICAL DATA:  Osteoarthritis left hip  EXAM: LEFT HIP - COMPLETE 2+ VIEW  COMPARISON:  None.  FINDINGS: There is moderate right hip joint space narrowing with moderately severe osteophyte formation. There is subchondral cystic change particularly in the right acetabulum.   On the left, there is severe hip joint space narrowing, severe osteophyte formation, and severe subchondral cystic change on both sides of the hip joint.  IMPRESSION: Moderate to severe right and severe left hip arthritis   Electronically Signed   By: Skipper Cliche M.D.   On: 02/05/2014 14:42   Dg Pelvis Portable  02/11/2014   CLINICAL DATA:  Postoperative from left hip joint replacement  EXAM: PORTABLE PELVIS 1-2 VIEWS  COMPARISON:  None.  FINDINGS: The patient has undergone total left hip joint replacement. Radiographic positioning of the prosthetic components is good. Surgical drain lines are present. The native bone is osteopenic. There are degenerative changes of the right hip.  IMPRESSION: The patient has undergone left total hip arthroplasty without evidence of immediate postprocedure complication.   Electronically Signed   By: David  Martinique   On: 02/11/2014 16:44   Dg C-arm 1-60 Min-no Report  02/11/2014   CLINICAL DATA: surgery   C-ARM 1-60 MINUTES  Fluoroscopy was utilized by the requesting physician.  No radiographic  interpretation.     EKG:No orders found for this or any previous visit.   Hospital Course: Patient was admitted to [facility] and taken to the OR and underwent the above state procedure without complications.  Patient tolerated the procedure well and was later transferred to the recovery room and then to the orthopaedic floor for postoperative care.  They were given PO and IV analgesics for pain control following their surgery.  They were given 24 hours of postoperative antibiotics of  Anti-infectives   Start     Dose/Rate Route Frequency Ordered Stop   02/12/14 0330  vancomycin (VANCOCIN) 1,000 mg in dextrose 5 % 250 mL IVPB     1,000 mg 250 mL/hr over 60 Minutes Intravenous  Once 02/12/14 0319 02/12/14 0427   02/12/14 0230  vancomycin (VANCOCIN) IVPB 1000 mg/200 mL premix  Status:  Discontinued     1,000 mg 200 mL/hr over 60 Minutes Intravenous Every 12 hours  02/11/14 1721 02/12/14 0318   02/11/14 1115  vancomycin (VANCOCIN) IVPB 1000 mg/200 mL premix     1,000 mg 200 mL/hr over 60 Minutes Intravenous On call to O.R. 02/11/14 1115 02/11/14 1355     and started on DVT prophylaxis in the form of Xarelto.   PT and OT were ordered for total hip protocol.  The patient was allowed  to be WBAT with therapy. Discharge planning was consulted to help with postop disposition and equipment needs.  Patient had a tough night on the evening of surgery.  He was seen on the morning of day one. Patient was having problems with pain in the hip, requiring pain medications. He also had an episode earlier that morning with bradycardia, low pressure and nausea. Treated with trendelenburg position and monitored. He was given additional fluids that morning and antiemetic since still had nausea. Kept foley for strict monitoring for the next four hours.  They started to get up OOB with therapy on day one later that morning and walked 30 and 45 feet with therapist.  Hemovac drain was pulled without difficulty.  Continued to work with therapy into day two.  Dressing was changed on day two and the incision was healing well.  Patient was seen in rounds on day two. Had some hiccups that night before. Also main complaint was muscle spasms. Changed muscle relaxant.  He was ready to go home later on day two.  Discharge home with home health  Diet - Regular diet  Follow up - in 2 weeks  Activity - WBAT  Disposition - Home  Condition Upon Discharge - Good  D/C Meds - See DC Summary  DVT Prophylaxis - Xarelto       Discharge Instructions   Call MD / Call 911    Complete by:  As directed   If you experience chest pain or shortness of breath, CALL 911 and be transported to the hospital emergency room.  If you develope a fever above 101 F, pus (white drainage) or increased drainage or redness at the wound, or calf pain, call your surgeon's office.     Change dressing    Complete by:  As  directed   You may change your dressing dressing daily with sterile 4 x 4 inch gauze dressing and paper tape.  Do not submerge the incision under water.     Constipation Prevention    Complete by:  As directed   Drink plenty of fluids.  Prune juice may be helpful.  You may use a stool softener, such as Colace (over the counter) 100 mg twice a day.  Use MiraLax (over the counter) for constipation as needed.     Diet general    Complete by:  As directed      Discharge instructions    Complete by:  As directed   Pick up stool softner and laxative for home. Do not submerge incision under water. May shower. Continue to use ice for pain and swelling from surgery.  Total Hip Protocol.  Take Xarelto for two and a half more weeks, then discontinue Xarelto. Once the patient has completed the Xarelto, they may resume the 81 mg Aspirin.     Do not sit on low chairs, stoools or toilet seats, as it may be difficult to get up from low surfaces    Complete by:  As directed      Driving restrictions    Complete by:  As directed   No driving until released by the physician.     Increase activity slowly as tolerated    Complete by:  As directed      Lifting restrictions    Complete by:  As directed   No lifting until released by the physician.     Patient may shower    Complete by:  As directed   You may shower without a  dressing once there is no drainage.  Do not wash over the wound.  If drainage remains, do not shower until drainage stops.     TED hose    Complete by:  As directed   Use stockings (TED hose) for 3 weeks on both leg(s).  You may remove them at night for sleeping.     Weight bearing as tolerated    Complete by:  As directed             Medication List    STOP taking these medications       aspirin EC 81 MG tablet     diclofenac sodium 1 % Gel  Commonly known as:  VOLTAREN     ibuprofen 200 MG tablet  Commonly known as:  ADVIL,MOTRIN     Melatonin 3 MG Tabs      sildenafil 100 MG tablet  Commonly known as:  VIAGRA      TAKE these medications       cyclobenzaprine 10 MG tablet  Commonly known as:  FLEXERIL  Take 1 tablet (10 mg total) by mouth 3 (three) times daily as needed for muscle spasms.     famotidine 20 MG tablet  Commonly known as:  PEPCID  Take 20 mg by mouth daily as needed for heartburn or indigestion.     hydroxyurea 500 MG capsule  Commonly known as:  HYDREA  Take 1,000 mg by mouth daily. May take with food to minimize GI side effects.     ondansetron 4 MG tablet  Commonly known as:  ZOFRAN  Take 1 tablet (4 mg total) by mouth every 6 (six) hours as needed for nausea.     oxyCODONE 5 MG immediate release tablet  Commonly known as:  Oxy IR/ROXICODONE  Take 1-2 tablets (5-10 mg total) by mouth every 3 (three) hours as needed for moderate pain, severe pain or breakthrough pain.     rivaroxaban 10 MG Tabs tablet  Commonly known as:  XARELTO  Take 1 tablet (10 mg total) by mouth daily with breakfast.     traMADol 50 MG tablet  Commonly known as:  ULTRAM  Take 1-2 tablets (50-100 mg total) by mouth every 6 (six) hours as needed (mild to moderate pain).       Follow-up Information   Follow up with Gearlean Alf, MD. Schedule an appointment as soon as possible for a visit in 2 weeks.   Specialty:  Orthopedic Surgery   Contact information:   [address] [place] [address] [phone]       Signed: Arlee Muslim, PA-C Orthopaedic Surgery 02/21/2014, 8:34 AM

## 2014-02-13 NOTE — Progress Notes (Signed)
Physical Therapy Treatment Patient Details Name: THEORDORE CISNERO MRN: [id number] DOB: [birth year] Today's Date: 02/13/2014    History of Present Illness s/p L  DA THA    PT Comments    Pt c/o dizziness  And nausea when up. Tolerated ambulation and practiced steps. Pt will need second visist prior to Dc to instruct wife.   Follow Up Recommendations  Home health PT     Equipment Recommendations  Rolling walker with 5" wheels;3in1 (PT)    Recommendations for Other Services       Precautions / Restrictions Precautions Precautions: None Restrictions Weight Bearing Restrictions: No Other Position/Activity Restrictions: WBAT    Mobility  Bed Mobility Overal bed mobility: Needs Assistance Bed Mobility: Supine to Sit     Supine to sit: Supervision Sit to supine: Min assist   General bed mobility comments: pt able to amnage  Lleg going to the Right side.  Transfers Overall transfer level: Needs assistance Equipment used: Rolling walker (2 wheeled)   Sit to Stand: Supervision         General transfer comment: no cues needed  Ambulation/Gait       Gait Pattern/deviations: Step-to pattern;Step-through pattern     General Gait Details: cues for sequence, RW position, posture; qable to achieve incr trunk flexion and incr WBing onLLE this pm   Stairs- Practiced 2 backward with RW and min assist.             Wheelchair Mobility    Modified Rankin (Stroke Patients Only)       Balance                                    Cognition Arousal/Alertness: Awake/alert Behavior During Therapy: WFL for tasks assessed/performed Overall Cognitive Status: Within Functional Limits for tasks assessed                      Exercises Total Joint Exercises Ankle Circles/Pumps: AROM;Both;10 reps Quad Sets: AROM;Strengthening;Both;10 reps Short Arc Quad: AROM;Left;10 reps;Supine Heel Slides: AAROM;Left;10 reps;Supine Long Arc Quad: AROM;Left;10  reps;Supine    General Comments        Pertinent Vitals/Pain Spasms L thigh, ice applied.    Home Living Family/patient expects to be discharged to:: Private residence Living Arrangements: Spouse/significant other Available Help at Discharge: Family Type of Home: House Home Access: Stairs to enter     Home Equipment: [place] - single point Additional Comments: has standard commodes; mattress and box springs are on floor without frame    Prior Function Level of Independence: Independent      Comments: struggled with LB dressing   PT Goals (current goals can now be found in the care plan section) Progress towards PT goals: Progressing toward goals    Frequency  7X/week    PT Plan Current plan remains appropriate    Co-evaluation             End of Session   Activity Tolerance: Patient tolerated treatment well Patient left: in chair;with call bell/phone within reach     Time: 8563-1497 PT Time Calculation (min): 51 min  Charges:  $Gait Training: 8-22 mins $Therapeutic Exercise: 8-22 mins $Self Care/Home Management: 8-22                    G Codes:      [name] 02/13/2014, 11:27 AM

## 2014-02-13 NOTE — Evaluation (Signed)
Occupational Therapy Evaluation Patient Details Name: Joshua Cabrera MRN: [id number] DOB: 1956/04/02 Today's Date: 02/13/2014    History of Present Illness s/p L  DA THA   Clinical Impression   This 58 year old man was admitted for the above surgery.  All education was completed.  No further OT needs at this time.      Follow Up Recommendations  No OT follow up    Equipment Recommendations  3 in 1 bedside comode    Recommendations for Other Services       Precautions / Restrictions Precautions Precautions: None Restrictions Other Position/Activity Restrictions: WBAT      Mobility Bed Mobility           Sit to supine: Min assist   General bed mobility comments: assist for LLE. Discussed use of sheet or crossing RLE under L ankle to assist  Transfers       Sit to Stand: Supervision         General transfer comment: no cues needed    Balance                                            ADL Overall ADL's : Needs assistance/impaired             Lower Body Bathing: Minimal assistance;Sit to/from stand       Lower Body Dressing: Moderate assistance;Sit to/from stand                 General ADL Comments: educated on AE and demonstrated sock aide.  Pt can have assistance at home.  He does not have a reacher--also showed him this.  Pt needs assistance for LB adls: educated to have assistance until he can comfortably reach to feet.   He is able to stand and sidestep with supervision:  had just returned from bathroom.  Pt has a tub/shower:  educated on sidestepping over when ready, and talked recommended washing feet once seated on commode      Vision                     Perception     Praxis      Pertinent Vitals/Pain No c/o pain     Hand Dominance     Extremity/Trunk Assessment Upper Extremity Assessment Upper Extremity Assessment: Overall WFL for tasks assessed           Communication  Communication Communication: No difficulties   Cognition Arousal/Alertness: Awake/alert Behavior During Therapy: WFL for tasks assessed/performed Overall Cognitive Status: Within Functional Limits for tasks assessed                     General Comments       Exercises       Shoulder Instructions      Home Living Family/patient expects to be discharged to:: Private residence Living Arrangements: Spouse/significant other Available Help at Discharge: Family Type of Home: House Home Access: Stairs to enter [employer] of Steps: 6 with rail(s) or 2 on the inside         Bathroom Shower/Tub: Tub/shower unit Shower/tub characteristics: Curtain Biochemist, clinical: Standard     Home Equipment: Cane - single point   Additional Comments: has standard commodes; mattress and box springs are on floor without frame      Prior Functioning/Environment Level of Independence: Independent  Comments: struggled with LB dressing    OT Diagnosis:     OT Problem List:     OT Treatment/Interventions:      OT Goals(Current goals can be found in the care plan section)    OT Frequency:     Barriers to D/C:            Co-evaluation              End of Session    Activity Tolerance: Patient tolerated treatment well Patient left: in bed;with call bell/phone within reach   Time: 6168-3729 OT Time Calculation (min): 18 min Charges:  OT General Charges $OT Visit: 1 Procedure OT Evaluation $Initial OT Evaluation Tier I: 1 Procedure OT Treatments $Self Care/Home Management : 8-22 mins G-Codes:    Sheetal Lyall 02/19/2014, 9:00 AM  Lesle Chris, OTR/L [phone] 02-19-2014

## 2014-02-13 NOTE — Progress Notes (Signed)
[facility]  [facility] is providing the following services: RW and Commode  If patient discharges after hours, please call [phone].   Linward Headland 02/13/2014, 11:36 AM

## 2014-02-13 NOTE — Progress Notes (Signed)
Physical Therapy Treatment Patient Details Name: Joshua Cabrera MRN: [id number] DOB: July 21, 1956 Today's Date: 02/13/2014    History of Present Illness s/p L  DA THA    PT Comments    Pt progressing well. Ready for DC.  Follow Up Recommendations  Home health PT     Equipment Recommendations       Recommendations for Other Services       Precautions / Restrictions Precautions Precautions: None    Mobility  Bed Mobility Overal bed mobility: Modified Independent                Transfers Overall transfer level: Needs assistance Equipment used: Rolling walker (2 wheeled) Transfers: Sit to/from Stand Sit to Stand: Modified independent (Device/Increase time)            Ambulation/Gait Ambulation/Gait assistance: Supervision Ambulation Distance (Feet): 100 Feet Assistive device: Rolling walker (2 wheeled) Gait Pattern/deviations: Step-through pattern;Step-to pattern;Antalgic Gait velocity: decr   General Gait Details: cues for sequence, RW position, posture;    Stairs Stairs: Yes Stairs assistance: Min assist Stair Management: No rails;Step to pattern;Backwards;With walker Number of Stairs: 2 General stair comments: family presentfor instructions  Wheelchair Mobility    Modified Rankin (Stroke Patients Only)       Balance                                    Cognition Arousal/Alertness: Awake/alert                          Exercises      General Comments        Pertinent Vitals/Pain l hip 3, feels woozy after meds.    Home Living                      Prior Function            PT Goals (current goals can now be found in the care plan section) Progress towards PT goals: Progressing toward goals    Frequency       PT Plan Current plan remains appropriate    Co-evaluation             End of Session   Activity Tolerance: Patient tolerated treatment well Patient left: with family/visitor  present     Time: 3825-0539 PT Time Calculation (min): 17 min  Charges:  $Gait Training: 8-22 mins                    G Codes:      [name] 02/13/2014, 4:13 PM

## 2014-02-13 NOTE — Progress Notes (Signed)
Pt still c/o hiccups. Called md on call awaiting call back.

## 2014-02-13 NOTE — Progress Notes (Signed)
   Subjective: 2 Days Post-Op Procedure(s) (LRB): LEFT TOTAL HIP ARTHROPLASTY ANTERIOR APPROACH (Left) Patient reports pain as mild.   Patient seen in rounds with Dr. Wynelle Link.  Had some hiccups last night.  Also main complaint is muscle spasms.  Will change muscle relaxant. Patient is well, but has had some minor complaints of pain in the hip, requiring pain medications Patient is ready to go home later today if does well with both sessions of therapy.  Objective: Vital signs in last 24 hours: Temp:  [98.2 F (36.8 C)-98.9 F (37.2 C)] 98.9 F (37.2 C) (07/01 1610) Pulse Rate:  [18-78] 78 (07/01 0638) Resp:  [16-18] 16 (07/01 0638) BP: (106-123)/(60-69) 109/60 mmHg (07/01 0638) SpO2:  [96 %-100 %] 96 % (07/01 9604)  Intake/Output from previous day:  Intake/Output Summary (Last 24 hours) at 02/13/14 0759 Last data filed at 02/13/14 0400  Gross per 24 hour  Intake   4215 ml  Output   3325 ml  Net    890 ml    Intake/Output this shift:    Labs:  Recent Labs  02/12/14 0530 02/13/14 0453  HGB 14.1 13.2    Recent Labs  02/12/14 0530 02/13/14 0453  WBC 16.1* 12.8*  RBC 4.43 4.17*  HCT 40.7 38.0*  PLT 519* 506*    Recent Labs  02/12/14 0530 02/13/14 0453  NA 134* 140  K 4.1 3.9  CL 99 104  CO2 26 24  BUN 12 10  CREATININE 0.64 0.62  GLUCOSE 183* 123*  CALCIUM 8.6 8.9   No results found for this basename: LABPT, INR,  in the last 72 hours  EXAM: General - Patient is Alert, Appropriate and Oriented Extremity - Neurovascular intact Sensation intact distally Dorsiflexion/Plantar flexion intact Incision - clean, dry, no drainage, healing Motor Function - intact, moving foot and toes well on exam.   Assessment/Plan: 2 Days Post-Op Procedure(s) (LRB): LEFT TOTAL HIP ARTHROPLASTY ANTERIOR APPROACH (Left) Procedure(s) (LRB): LEFT TOTAL HIP ARTHROPLASTY ANTERIOR APPROACH (Left) Past Medical History  Diagnosis Date  . Pneumonia   . Thrombocytosis   .  Complication of anesthesia   . PONV (postoperative nausea and vomiting)   . Unspecified diseases of blood and blood-forming organs     "essential hyperthrombocytosis"  . GERD (gastroesophageal reflux disease)   . Arthritis     osteoarthritis -hips,spine,hands and left wrist.   Principal Problem:   OA (osteoarthritis) of hip Active Problems:   Polycythemia   Hyponatremia  Estimated body mass index is 23.96 kg/(m^2) as calculated from the following:   Height as of this encounter: 5\' 10"  (1.778 m).   Weight as of this encounter: 75.751 kg (167 lb). Up with therapy Discharge home with home health Diet - Regular diet Follow up - in 2 weeks Activity - WBAT Disposition - Home Condition Upon Discharge - Good D/C Meds - See DC Summary DVT Prophylaxis - Xarelto  Arlee Muslim, PA-C Orthopaedic Surgery 02/13/2014, 7:59 AM

## 2014-02-21 DIAGNOSIS — R001 Bradycardia, unspecified: Secondary | ICD-10-CM | POA: Diagnosis not present

## 2014-02-28 LAB — CBC CANCER CENTER
Basophil #: 0.2 x10 3/mm — ABNORMAL HIGH (ref 0.0–0.1)
Basophil %: 2.7 %
EOS PCT: 5.6 %
Eosinophil #: 0.4 x10 3/mm (ref 0.0–0.7)
HCT: 40.1 % (ref 40.0–52.0)
HGB: 13.5 g/dL (ref 13.0–18.0)
Lymphocyte #: 1.8 x10 3/mm (ref 1.0–3.6)
Lymphocyte %: 23.5 %
MCH: 32.3 pg (ref 26.0–34.0)
MCHC: 33.6 g/dL (ref 32.0–36.0)
MCV: 96 fL (ref 80–100)
MONO ABS: 0.8 x10 3/mm (ref 0.2–1.0)
Monocyte %: 11.3 %
Neutrophil #: 4.3 x10 3/mm (ref 1.4–6.5)
Neutrophil %: 56.9 %
Platelet: 639 x10 3/mm — ABNORMAL HIGH (ref 150–440)
RBC: 4.18 10*6/uL — ABNORMAL LOW (ref 4.40–5.90)
RDW: 16.1 % — AB (ref 11.5–14.5)
WBC: 7.5 x10 3/mm (ref 3.8–10.6)

## 2014-02-28 LAB — HEPATIC FUNCTION PANEL A (ARMC)
Albumin: 3.1 g/dL — ABNORMAL LOW (ref 3.4–5.0)
Alkaline Phosphatase: 109 U/L
BILIRUBIN TOTAL: 0.5 mg/dL (ref 0.2–1.0)
Bilirubin, Direct: 0.1 mg/dL (ref 0.00–0.20)
SGOT(AST): 16 U/L (ref 15–37)
SGPT (ALT): 40 U/L (ref 12–78)
Total Protein: 7.4 g/dL (ref 6.4–8.2)

## 2014-02-28 LAB — CREATININE, SERUM
Creatinine: 0.86 mg/dL (ref 0.60–1.30)
EGFR (African American): >60

## 2014-03-16 ENCOUNTER — Ambulatory Visit: Payer: Self-pay | Admitting: Internal Medicine

## 2014-03-22 LAB — CBC CANCER CENTER
Basophil #: 0.1 x10 3/mm (ref 0.0–0.1)
Basophil %: 1.2 %
EOS PCT: 4.7 %
Eosinophil #: 0.3 x10 3/mm (ref 0.0–0.7)
HCT: 41.7 % (ref 40.0–52.0)
HGB: 13.9 g/dL (ref 13.0–18.0)
Lymphocyte #: 2 x10 3/mm (ref 1.0–3.6)
Lymphocyte %: 31.5 %
MCH: 32.7 pg (ref 26.0–34.0)
MCHC: 33.4 g/dL (ref 32.0–36.0)
MCV: 98 fL (ref 80–100)
MONO ABS: 0.7 x10 3/mm (ref 0.2–1.0)
MONOS PCT: 11.9 %
NEUTROS PCT: 50.7 %
Neutrophil #: 3.1 x10 3/mm (ref 1.4–6.5)
Platelet: 380 x10 3/mm (ref 150–440)
RBC: 4.26 10*6/uL — ABNORMAL LOW (ref 4.40–5.90)
RDW: 17.2 % — ABNORMAL HIGH (ref 11.5–14.5)
WBC: 6.2 x10 3/mm (ref 3.8–10.6)

## 2014-04-16 ENCOUNTER — Ambulatory Visit: Payer: Self-pay | Admitting: Internal Medicine

## 2014-09-13 ENCOUNTER — Ambulatory Visit: Payer: Self-pay | Admitting: Internal Medicine

## 2014-09-13 LAB — CBC CANCER CENTER
Basophil #: 0.1 x10 3/mm (ref 0.0–0.1)
Basophil %: 1.3 %
EOS PCT: 3.5 %
Eosinophil #: 0.3 x10 3/mm (ref 0.0–0.7)
HCT: 47.2 % (ref 40.0–52.0)
HGB: 15.1 g/dL (ref 13.0–18.0)
Lymphocyte #: 1.8 x10 3/mm (ref 1.0–3.6)
Lymphocyte %: 24.1 %
MCH: 28.5 pg (ref 26.0–34.0)
MCHC: 32 g/dL (ref 32.0–36.0)
MCV: 89 fL (ref 80–100)
MONOS PCT: 13.9 %
Monocyte #: 1 x10 3/mm (ref 0.2–1.0)
NEUTROS ABS: 4.3 x10 3/mm (ref 1.4–6.5)
Neutrophil %: 57.2 %
Platelet: 699 x10 3/mm — ABNORMAL HIGH (ref 150–440)
RBC: 5.3 10*6/uL (ref 4.40–5.90)
RDW: 16.4 % — ABNORMAL HIGH (ref 11.5–14.5)
WBC: 7.5 x10 3/mm (ref 3.8–10.6)

## 2014-09-16 ENCOUNTER — Ambulatory Visit: Payer: Self-pay | Admitting: Internal Medicine

## 2014-10-14 IMAGING — DX DG PORTABLE PELVIS
1 series · 1 of 1 positions shown · non-contrast
Comparison: None.

CLINICAL DATA: Postoperative from left hip joint replacement

EXAM:
PORTABLE PELVIS 1-2 VIEWS

[pelvis ap]
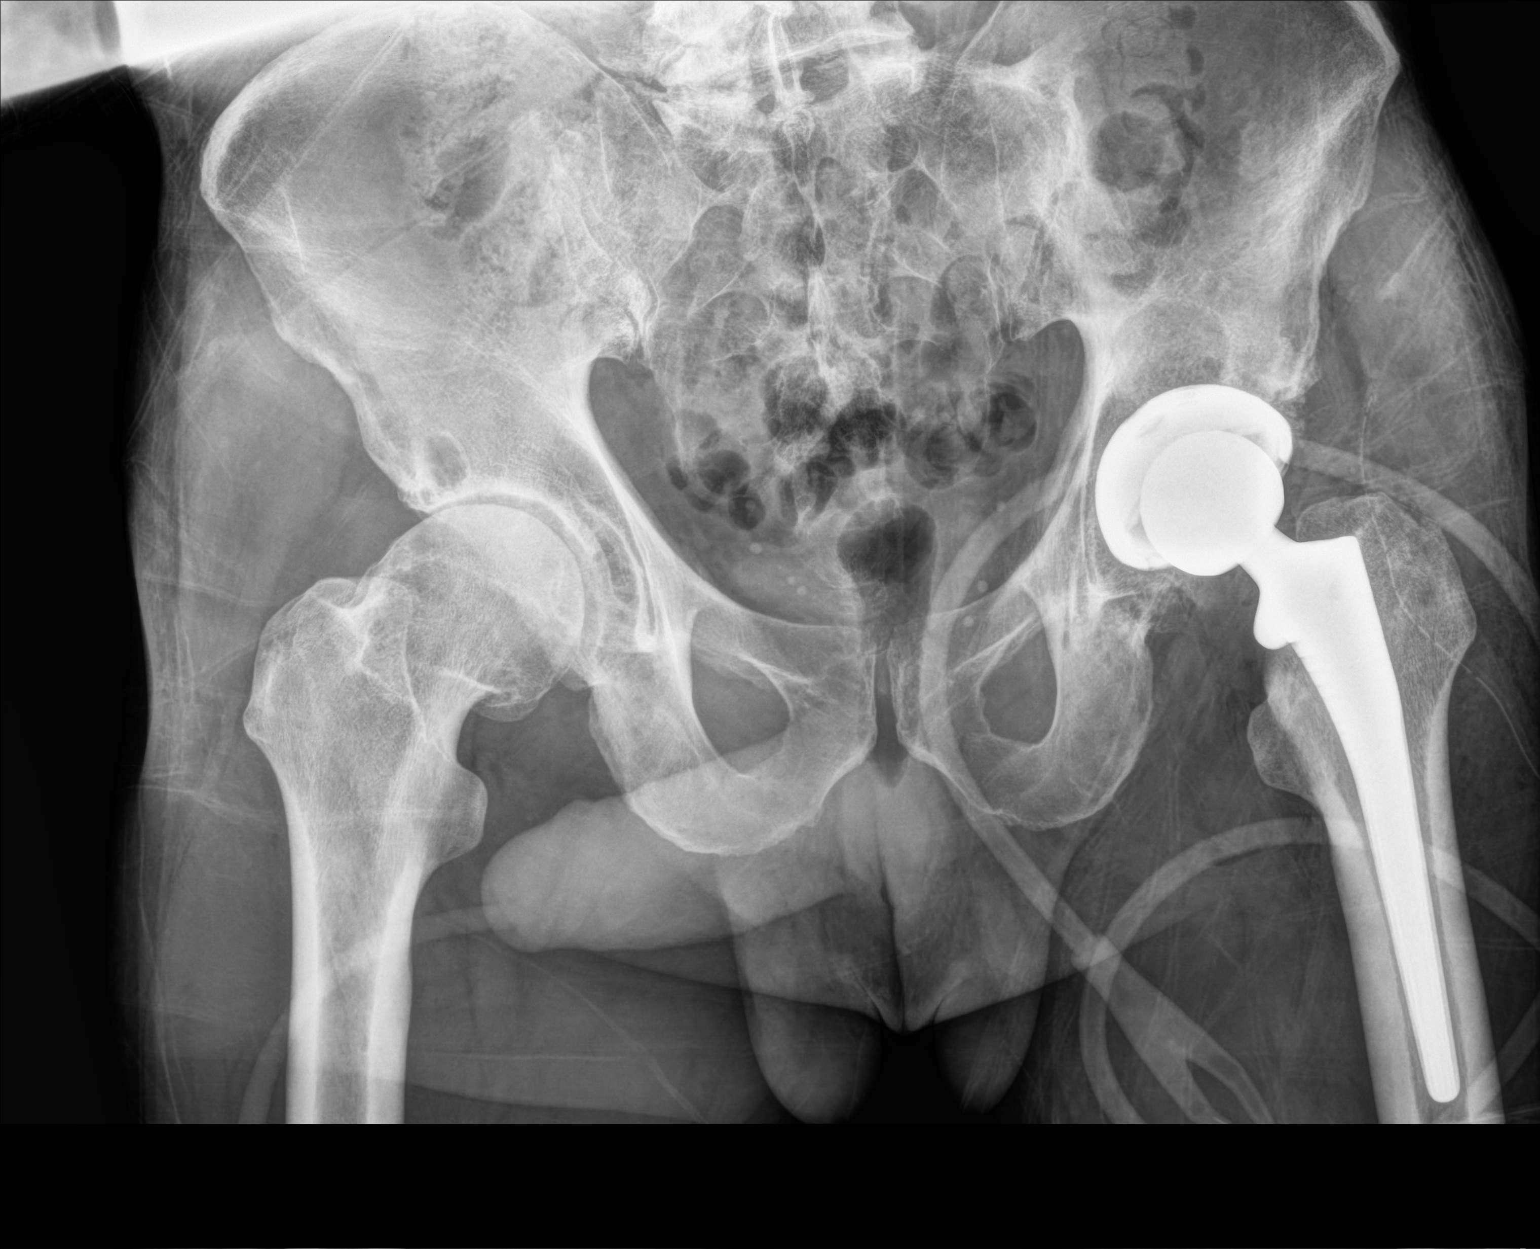

[1 of 1 positions shown; findings below may reference images not displayed]

FINDINGS: The patient has undergone total left hip joint replacement.
Radiographic positioning of the prosthetic components is good.
Surgical drain lines are present. The native bone is osteopenic.
There are degenerative changes of the right hip.
IMPRESSION: The patient has undergone left total hip arthroplasty without
evidence of immediate postprocedure complication.

## 2014-10-25 ENCOUNTER — Ambulatory Visit
Admit: 2014-10-25 | Disposition: A | Discharge status: Home / Self Care | Payer: Self-pay | Attending: Internal Medicine | Admitting: Internal Medicine

## 2014-10-25 LAB — CREATININE, SERUM: CREATINE, SERUM: 0.8

## 2014-11-15 ENCOUNTER — Ambulatory Visit
Admit: 2014-11-15 | Disposition: A | Discharge status: Home / Self Care | Payer: Self-pay | Attending: Internal Medicine | Admitting: Internal Medicine

## 2014-11-25 LAB — CBC CANCER CENTER
BASOS PCT: 2.8 %
Basophil #: 0.2 x10 3/mm — ABNORMAL HIGH (ref 0.0–0.1)
EOS PCT: 4.2 %
Eosinophil #: 0.3 x10 3/mm (ref 0.0–0.7)
HCT: 44.2 % (ref 40.0–52.0)
HGB: 14.6 g/dL (ref 13.0–18.0)
LYMPHS PCT: 23.5 %
Lymphocyte #: 1.7 x10 3/mm (ref 1.0–3.6)
MCH: 30.3 pg (ref 26.0–34.0)
MCHC: 33 g/dL (ref 32.0–36.0)
MCV: 92 fL (ref 80–100)
Monocyte #: 1 x10 3/mm (ref 0.2–1.0)
Monocyte %: 13.5 %
NEUTROS PCT: 56 %
Neutrophil #: 4 x10 3/mm (ref 1.4–6.5)
Platelet: 606 x10 3/mm — ABNORMAL HIGH (ref 150–440)
RBC: 4.82 10*6/uL (ref 4.40–5.90)
RDW: 15.9 % — ABNORMAL HIGH (ref 11.5–14.5)
WBC: 7.1 x10 3/mm (ref 3.8–10.6)

## 2014-12-19 ENCOUNTER — Inpatient Hospital Stay: Payer: BLUE CROSS/BLUE SHIELD | Attending: Internal Medicine

## 2014-12-19 ENCOUNTER — Other Ambulatory Visit: Payer: Self-pay

## 2014-12-19 DIAGNOSIS — D696 Thrombocytopenia, unspecified: Secondary | ICD-10-CM | POA: Diagnosis present

## 2014-12-19 DIAGNOSIS — D473 Essential (hemorrhagic) thrombocythemia: Secondary | ICD-10-CM

## 2014-12-19 LAB — CBC WITH DIFFERENTIAL/PLATELET
Basophils Absolute: 0.1 10*3/uL (ref 0–0.1)
Basophils Relative: 2 %
Eosinophils Absolute: 0.2 10*3/uL (ref 0–0.7)
Eosinophils Relative: 3 %
HCT: 44.4 % (ref 40.0–52.0)
Hemoglobin: 14.7 g/dL (ref 13.0–18.0)
LYMPHS ABS: 1.7 10*3/uL (ref 1.0–3.6)
Lymphocytes Relative: 23 %
MCH: 30.8 pg (ref 26.0–34.0)
MCHC: 33.2 g/dL (ref 32.0–36.0)
MCV: 92.8 fL (ref 80.0–100.0)
Monocytes Absolute: 0.8 10*3/uL (ref 0.2–1.0)
Monocytes Relative: 11 %
NEUTROS PCT: 61 %
Neutro Abs: 4.5 10*3/uL (ref 1.4–6.5)
Platelets: 554 10*3/uL — ABNORMAL HIGH (ref 150–440)
RBC: 4.78 MIL/uL (ref 4.40–5.90)
RDW: 16.1 % — ABNORMAL HIGH (ref 11.5–14.5)
WBC: 7.2 10*3/uL (ref 3.8–10.6)

## 2015-01-17 ENCOUNTER — Inpatient Hospital Stay: Payer: BLUE CROSS/BLUE SHIELD | Attending: Internal Medicine

## 2015-01-17 ENCOUNTER — Other Ambulatory Visit: Payer: Self-pay | Admitting: *Deleted

## 2015-01-17 DIAGNOSIS — D696 Thrombocytopenia, unspecified: Secondary | ICD-10-CM | POA: Diagnosis not present

## 2015-01-17 DIAGNOSIS — D473 Essential (hemorrhagic) thrombocythemia: Secondary | ICD-10-CM

## 2015-01-17 LAB — CBC WITH DIFFERENTIAL/PLATELET
Basophils Absolute: 0.2 10*3/uL — ABNORMAL HIGH (ref 0–0.1)
Basophils Relative: 3 %
EOS ABS: 0.4 10*3/uL (ref 0–0.7)
HCT: 46.4 % (ref 40.0–52.0)
HEMOGLOBIN: 15.2 g/dL (ref 13.0–18.0)
Lymphocytes Relative: 20 %
Lymphs Abs: 1.3 10*3/uL (ref 1.0–3.6)
MCH: 31 pg (ref 26.0–34.0)
MCHC: 32.7 g/dL (ref 32.0–36.0)
MCV: 95 fL (ref 80.0–100.0)
MONO ABS: 0.8 10*3/uL (ref 0.2–1.0)
Monocytes Relative: 12 %
Neutro Abs: 3.9 10*3/uL (ref 1.4–6.5)
Platelets: 623 10*3/uL — ABNORMAL HIGH (ref 150–440)
RBC: 4.89 MIL/uL (ref 4.40–5.90)
RDW: 15 % — AB (ref 11.5–14.5)
WBC: 6.6 10*3/uL (ref 3.8–10.6)

## 2015-02-14 ENCOUNTER — Inpatient Hospital Stay: Payer: BLUE CROSS/BLUE SHIELD | Attending: Internal Medicine

## 2015-02-14 DIAGNOSIS — D696 Thrombocytopenia, unspecified: Secondary | ICD-10-CM | POA: Diagnosis present

## 2015-02-14 DIAGNOSIS — D473 Essential (hemorrhagic) thrombocythemia: Secondary | ICD-10-CM

## 2015-02-14 LAB — CBC WITH DIFFERENTIAL/PLATELET
BASOS ABS: 0.1 10*3/uL (ref 0–0.1)
Basophils Relative: 1 %
EOS PCT: 4 %
Eosinophils Absolute: 0.3 10*3/uL (ref 0–0.7)
HCT: 46.1 % (ref 40.0–52.0)
Hemoglobin: 15.3 g/dL (ref 13.0–18.0)
LYMPHS ABS: 1.8 10*3/uL (ref 1.0–3.6)
LYMPHS PCT: 25 %
MCH: 31.6 pg (ref 26.0–34.0)
MCHC: 33.1 g/dL (ref 32.0–36.0)
MCV: 95.4 fL (ref 80.0–100.0)
MONO ABS: 1 10*3/uL (ref 0.2–1.0)
Monocytes Relative: 13 %
NEUTROS ABS: 4.2 10*3/uL (ref 1.4–6.5)
Neutrophils Relative %: 57 %
Platelets: 641 10*3/uL — ABNORMAL HIGH (ref 150–440)
RBC: 4.83 MIL/uL (ref 4.40–5.90)
RDW: 13.9 % (ref 11.5–14.5)
WBC: 7.4 10*3/uL (ref 3.8–10.6)

## 2015-03-14 ENCOUNTER — Inpatient Hospital Stay: Payer: BLUE CROSS/BLUE SHIELD

## 2015-03-14 DIAGNOSIS — D473 Essential (hemorrhagic) thrombocythemia: Secondary | ICD-10-CM

## 2015-03-14 DIAGNOSIS — D696 Thrombocytopenia, unspecified: Secondary | ICD-10-CM | POA: Diagnosis not present

## 2015-03-14 LAB — HEPATIC FUNCTION PANEL
ALT: 15 U/L — AB (ref 17–63)
AST: 22 U/L (ref 15–41)
Albumin: 4.2 g/dL (ref 3.5–5.0)
Alkaline Phosphatase: 63 U/L (ref 38–126)
BILIRUBIN DIRECT: 0.1 mg/dL (ref 0.1–0.5)
Indirect Bilirubin: 1.2 mg/dL — ABNORMAL HIGH (ref 0.3–0.9)
TOTAL PROTEIN: 7.1 g/dL (ref 6.5–8.1)
Total Bilirubin: 1.3 mg/dL — ABNORMAL HIGH (ref 0.3–1.2)

## 2015-03-14 LAB — CBC WITH DIFFERENTIAL/PLATELET
BASOS ABS: 0.2 10*3/uL — AB (ref 0–0.1)
BASOS PCT: 3 %
Eosinophils Absolute: 0.2 10*3/uL (ref 0–0.7)
Eosinophils Relative: 3 %
HEMATOCRIT: 46 % (ref 40.0–52.0)
Hemoglobin: 15.5 g/dL (ref 13.0–18.0)
LYMPHS PCT: 23 %
Lymphs Abs: 1.6 10*3/uL (ref 1.0–3.6)
MCH: 31.7 pg (ref 26.0–34.0)
MCHC: 33.6 g/dL (ref 32.0–36.0)
MCV: 94.4 fL (ref 80.0–100.0)
MONO ABS: 0.9 10*3/uL (ref 0.2–1.0)
MONOS PCT: 13 %
Neutro Abs: 4.2 10*3/uL (ref 1.4–6.5)
Neutrophils Relative %: 58 %
PLATELETS: 604 10*3/uL — AB (ref 150–440)
RBC: 4.87 MIL/uL (ref 4.40–5.90)
RDW: 13.5 % (ref 11.5–14.5)
WBC: 7.3 10*3/uL (ref 3.8–10.6)

## 2015-03-14 LAB — CREATININE, SERUM
Creatinine, Ser: 0.78 mg/dL (ref 0.61–1.24)
GFR calc non Af Amer: >60 mL/min (ref >60)

## 2015-04-11 ENCOUNTER — Inpatient Hospital Stay (HOSPITAL_BASED_OUTPATIENT_CLINIC_OR_DEPARTMENT_OTHER): Payer: BLUE CROSS/BLUE SHIELD | Admitting: Internal Medicine

## 2015-04-11 ENCOUNTER — Inpatient Hospital Stay: Payer: BLUE CROSS/BLUE SHIELD | Attending: Internal Medicine

## 2015-04-11 VITALS — BP 110/71 | HR 68 | Temp 96.4°F | Resp 18 | Ht 70.0 in | Wt 170.6 lb

## 2015-04-11 DIAGNOSIS — M129 Arthropathy, unspecified: Secondary | ICD-10-CM | POA: Insufficient documentation

## 2015-04-11 DIAGNOSIS — G8929 Other chronic pain: Secondary | ICD-10-CM

## 2015-04-11 DIAGNOSIS — M545 Low back pain: Secondary | ICD-10-CM | POA: Diagnosis not present

## 2015-04-11 DIAGNOSIS — Z79899 Other long term (current) drug therapy: Secondary | ICD-10-CM | POA: Diagnosis not present

## 2015-04-11 DIAGNOSIS — M419 Scoliosis, unspecified: Secondary | ICD-10-CM | POA: Diagnosis not present

## 2015-04-11 DIAGNOSIS — M4806 Spinal stenosis, lumbar region: Secondary | ICD-10-CM

## 2015-04-11 DIAGNOSIS — Z7982 Long term (current) use of aspirin: Secondary | ICD-10-CM

## 2015-04-11 DIAGNOSIS — Z800 Family history of malignant neoplasm of digestive organs: Secondary | ICD-10-CM

## 2015-04-11 DIAGNOSIS — D473 Essential (hemorrhagic) thrombocythemia: Secondary | ICD-10-CM | POA: Diagnosis present

## 2015-04-11 DIAGNOSIS — K219 Gastro-esophageal reflux disease without esophagitis: Secondary | ICD-10-CM | POA: Diagnosis not present

## 2015-04-11 LAB — CBC WITH DIFFERENTIAL/PLATELET
BASOS PCT: 1 %
Basophils Absolute: 0.1 10*3/uL (ref 0–0.1)
Eosinophils Absolute: 0.2 10*3/uL (ref 0–0.7)
Eosinophils Relative: 2 %
HEMATOCRIT: 46.1 % (ref 40.0–52.0)
Hemoglobin: 15.7 g/dL (ref 13.0–18.0)
Lymphocytes Relative: 21 %
Lymphs Abs: 1.6 10*3/uL (ref 1.0–3.6)
MCH: 31.8 pg (ref 26.0–34.0)
MCHC: 34.1 g/dL (ref 32.0–36.0)
MCV: 93.4 fL (ref 80.0–100.0)
MONO ABS: 0.8 10*3/uL (ref 0.2–1.0)
MONOS PCT: 11 %
Neutro Abs: 4.8 10*3/uL (ref 1.4–6.5)
Neutrophils Relative %: 65 %
Platelets: 655 10*3/uL — ABNORMAL HIGH (ref 150–440)
RBC: 4.94 MIL/uL (ref 4.40–5.90)
RDW: 13.6 % (ref 11.5–14.5)
WBC: 7.5 10*3/uL (ref 3.8–10.6)

## 2015-04-29 NOTE — Progress Notes (Signed)
[facility]  [phone] [phone]     ID: Twana First OB: [birth year]  MR#: [id number]  LAG#:[id number]  Patient Care Team: No Pcp Per Patient as PCP - General (General Practice)  CHIEF COMPLAINT/DIAGNOSIS:  Essential Thrombocytosis, Positive JAK 2 mutation analysis - on low dose Hydrea, and Aspirin. Patient diagnosed and followed at [facility] in the past, now follows here.    HISTORY OF PRESENT ILLNESS:  Patient returns for continued hematology follow-up, he was seen a few months ago. He is currently on low-dose hydroxyurea. Denies any new complaints clinically, eating well, physically active. Platelet count has remained steady mostly in the range of 600-700K. Denies h/o thromboembolic phenomena. No fevers or night sweats. No other new bone pains. No bleeding issues.   REVIEW OF SYSTEMS:   ROS As in HPI above. In addition, no fevers or sweats. No new headaches or focal weakness.  No new sore throat, cough, shortness of breath, sputum, hemoptysis or chest pain. No dizziness or palpitation. No abdominal pain, constipation, diarrhea, dysuria or hematuria. No new skin rash or bleeding symptoms. No new paresthesias in extremities.   PAST MEDICAL HISTORY: Reviewed.         Essential Thrombocytosis, Positive JAK 2 mutation analysis   Allergies   ? h/o  GERD  Chronic low back pain , arthritis.  MRI lumbar spine without contrast had reported moderate lumbar scoliosis, L5-S1 severe right foraminal stenosis, L4-L5 bilateral subarticular lateral recess stenosis.   Rotator cuff repair surgeries x 3  PAST SURGICAL HISTORY: Reviewed. As above.  FAMILY HISTORY: Reviewed. Remarkable for strokes and Alzheimer's dementia in his mother. Otherwise remarkable for colon cancer. Denies hematological disorders or thromboembolic phenomena.    SOCIAL HISTORY: Reviewed. Denies smoking. Takes a small amount of alcohol regularly. Denies recreational drug usage.  Allergies    Allergen Reactions  . Penicillins Rash    Current Outpatient Prescriptions  Medication Sig Dispense Refill  . aspirin 81 MG tablet Take 81 mg by mouth daily.    . hydroxyurea (DROXIA) 200 MG capsule Take 200 mg by mouth daily.    Marland Kitchen ibuprofen (ADVIL,MOTRIN) 800 MG tablet Take 800 mg by mouth every 8 (eight) hours as needed.    . sildenafil (VIAGRA) 50 MG tablet Take 25 mg by mouth daily as needed for erectile dysfunction.    . famotidine (PEPCID) 20 MG tablet Take 20 mg by mouth daily with supper.     No current facility-administered medications for this visit.    PHYSICAL EXAM: Filed Vitals:   04/11/15 1153  BP: 110/71  Pulse: 68  Temp: 96.4 F (35.8 C)  Resp: 18     Body mass index is 24.48 kg/(m^2).      GENERAL: Patient is alert and oriented and in no acute distress. There is no icterus. HEENT: EOMs intact. Oral exam negative for thrush or lesions. No cervical lymphadenopathy. CVS: S1S2, regular LUNGS: Bilaterally clear to auscultation, no rhonchi. ABDOMEN: Soft, nontender. No hepatosplenomegaly clinically.  EXTREMITIES: No pedal edema.   LAB RESULTS:    Component Value Date/Time   CREATININE 0.78 03/14/2015 1146   CREATININE 0.86 02/28/2014 1450   PROT 7.1 03/14/2015 1146   PROT 7.4 02/28/2014 1450   ALBUMIN 4.2 03/14/2015 1146   ALBUMIN 3.1* 02/28/2014 1450   AST 22 03/14/2015 1146   AST 16 02/28/2014 1450   ALT 15* 03/14/2015 1146   ALT 40 02/28/2014 1450   ALKPHOS 63 03/14/2015 1146   ALKPHOS 109 02/28/2014 1450  BILITOT 1.3* 03/14/2015 1146   BILITOT 0.5 02/28/2014 1450   GFRNONAA >60 03/14/2015 1146   GFRNONAA >60 02/28/2014 1450   GFRAA >60 03/14/2015 1146   GFRAA >60 02/28/2014 1450    Lab Results  Component Value Date   WBC 7.5 04/11/2015   NEUTROABS 4.8 04/11/2015   HGB 15.7 04/11/2015   HCT 46.1 04/11/2015   MCV 93.4 04/11/2015   PLT 655* 04/11/2015     ASSESSMENT / PLAN:   Essential Thrombocytosis, Positive JAK 2 mutation analysis   -  reviewed labs from today and recent, and discussed with patient. Explained that platelet count continues to remain consistently >600, and recommended increasing dose of Hydrea slightly to see if he can control lately count better. Patient is agreeable to try this and will notify us if any new side effects, we will increase Hydrea to 500 mg Monday Wednesday Friday and continue 200 mg on Sunday Tuesday Thursday Saturday. He will call us around week of September 5 based on how he tolerates this current dose and remind [place] for prescription refills. Will monitor CBC every 4 weeks, and see him back in 24 weeks with repeat labs including CBC, creatinine, and LFT. In between visits, patient advised to call or come to ER in case of any new symptoms or acute sickness. He is agreeable to this plan.     Leia Alf, MD   04/29/2015 5:33 PM

## 2015-05-09 ENCOUNTER — Telehealth: Payer: Self-pay | Admitting: *Deleted

## 2015-05-09 ENCOUNTER — Inpatient Hospital Stay: Payer: BLUE CROSS/BLUE SHIELD | Attending: Internal Medicine

## 2015-05-09 NOTE — Telephone Encounter (Signed)
Called and spoke to pharmacy and they can order the new dosage 300 mg each day but it will not be in til Monday. Called pt and he has enough supply and he just needs refill called in with new dose and it has been done with  refills

## 2015-06-06 ENCOUNTER — Inpatient Hospital Stay: Payer: BLUE CROSS/BLUE SHIELD | Attending: Internal Medicine

## 2015-06-13 ENCOUNTER — Inpatient Hospital Stay: Payer: BLUE CROSS/BLUE SHIELD | Attending: Internal Medicine

## 2015-06-13 DIAGNOSIS — D473 Essential (hemorrhagic) thrombocythemia: Secondary | ICD-10-CM | POA: Diagnosis present

## 2015-06-13 LAB — CBC WITH DIFFERENTIAL/PLATELET
BASOS ABS: 0 10*3/uL (ref 0–0.1)
Basophils Relative: 0 %
EOS PCT: 3 %
Eosinophils Absolute: 0.2 10*3/uL (ref 0–0.7)
HCT: 45.4 % (ref 40.0–52.0)
Hemoglobin: 15.3 g/dL (ref 13.0–18.0)
LYMPHS ABS: 1.7 10*3/uL (ref 1.0–3.6)
LYMPHS PCT: 23 %
MCH: 32.5 pg (ref 26.0–34.0)
MCHC: 33.7 g/dL (ref 32.0–36.0)
MCV: 96.4 fL (ref 80.0–100.0)
MONO ABS: 1 10*3/uL (ref 0.2–1.0)
Monocytes Relative: 13 %
NEUTROS ABS: 4.5 10*3/uL (ref 1.4–6.5)
NEUTROS PCT: 61 %
PLATELETS: 620 10*3/uL — AB (ref 150–440)
RBC: 4.71 MIL/uL (ref 4.40–5.90)
RDW: 14.6 % — AB (ref 11.5–14.5)
WBC: 7.4 10*3/uL (ref 3.8–10.6)

## 2015-07-04 ENCOUNTER — Encounter: Payer: Self-pay | Admitting: Internal Medicine

## 2015-07-04 ENCOUNTER — Other Ambulatory Visit: Payer: Self-pay | Admitting: *Deleted

## 2015-07-04 ENCOUNTER — Inpatient Hospital Stay: Payer: BLUE CROSS/BLUE SHIELD | Attending: Internal Medicine | Admitting: Internal Medicine

## 2015-07-04 ENCOUNTER — Inpatient Hospital Stay: Payer: BLUE CROSS/BLUE SHIELD

## 2015-07-04 VITALS — BP 107/69 | HR 74 | Temp 96.7°F | Resp 18 | Ht 70.0 in | Wt 164.5 lb

## 2015-07-04 DIAGNOSIS — D473 Essential (hemorrhagic) thrombocythemia: Secondary | ICD-10-CM | POA: Diagnosis not present

## 2015-07-04 DIAGNOSIS — Z7982 Long term (current) use of aspirin: Secondary | ICD-10-CM | POA: Diagnosis not present

## 2015-07-04 DIAGNOSIS — Z79899 Other long term (current) drug therapy: Secondary | ICD-10-CM | POA: Diagnosis not present

## 2015-07-04 DIAGNOSIS — M129 Arthropathy, unspecified: Secondary | ICD-10-CM

## 2015-07-04 LAB — CBC WITH DIFFERENTIAL/PLATELET
Basophils Absolute: 0.1 10*3/uL (ref 0–0.1)
Basophils Relative: 1 %
EOS ABS: 0.2 10*3/uL (ref 0–0.7)
EOS PCT: 3 %
HCT: 44.9 % (ref 40.0–52.0)
Hemoglobin: 15.2 g/dL (ref 13.0–18.0)
LYMPHS ABS: 1.6 10*3/uL (ref 1.0–3.6)
LYMPHS PCT: 25 %
MCH: 32.9 pg (ref 26.0–34.0)
MCHC: 33.8 g/dL (ref 32.0–36.0)
MCV: 97.2 fL (ref 80.0–100.0)
MONO ABS: 0.8 10*3/uL (ref 0.2–1.0)
MONOS PCT: 12 %
Neutro Abs: 3.8 10*3/uL (ref 1.4–6.5)
Neutrophils Relative %: 59 %
PLATELETS: 597 10*3/uL — AB (ref 150–440)
RBC: 4.62 MIL/uL (ref 4.40–5.90)
RDW: 14.4 % (ref 11.5–14.5)
WBC: 6.3 10*3/uL (ref 3.8–10.6)

## 2015-07-04 LAB — HEPATIC FUNCTION PANEL
ALK PHOS: 50 U/L (ref 38–126)
ALT: 19 U/L (ref 17–63)
AST: 19 U/L (ref 15–41)
Albumin: 4 g/dL (ref 3.5–5.0)
BILIRUBIN DIRECT: 0.2 mg/dL (ref 0.1–0.5)
BILIRUBIN INDIRECT: 1.2 mg/dL — AB (ref 0.3–0.9)
BILIRUBIN TOTAL: 1.4 mg/dL — AB (ref 0.3–1.2)
TOTAL PROTEIN: 6.7 g/dL (ref 6.5–8.1)

## 2015-07-04 LAB — CREATININE, SERUM
CREATININE: 0.78 mg/dL (ref 0.61–1.24)
GFR calc Af Amer: >60 mL/min (ref >60)
GFR calc non Af Amer: >60 mL/min (ref >60)

## 2015-07-04 MED ORDER — DROXIA 300 MG PO CAPS: 300.0000 mg | ORAL_CAPSULE | Freq: Two times a day (BID) | ORAL | Status: DC

## 2015-07-04 NOTE — Progress Notes (Signed)
[facility] OFFICE PROGRESS NOTE  Patient Care Team: No Pcp Per Patient as PCP - General (General Practice)   SUMMARY OF ONCOLOGIC HISTORY:  # 2011- ESSENTIAL THROMBOCYTOSIS [Jak-2 pos; incidental]  Summer 2015- Start hydrea;Asprin 81 mg/d; NOV 2016- START hydrea 300 BID [platlets ~600]  INTERVAL HISTORY:  This is my first interaction with the patient since I joined the practice September 2016. I reviewed the patient's prior charts/pertinent labs/imaging in detail; findings are summarized above.   A very pleasant 59 year old male patient with above history of essential thrombocytosis currently on low-dose Hydrea 300 mg once a day is here for follow-up.  Patient denies any unusual pain in fingertips or toes. Denies any history of strokes or blood clots.  Denies any nausea vomiting. Appetite is good. Denies any bleeding.  REVIEW OF SYSTEMS:  A complete 10 point review of system is done which is negative except mentioned above/history of present illness.   PAST MEDICAL HISTORY :  Past Medical History  Diagnosis Date  . Arthritis     PAST SURGICAL HISTORY :  No past surgical history on file.  FAMILY HISTORY :  No family history on file.  SOCIAL HISTORY:   Social History  Substance Use Topics  . Smoking status: Never Smoker   . Smokeless tobacco: Never Used  . Alcohol Use: Not on file    ALLERGIES:  is allergic to penicillins.  MEDICATIONS:  Current Outpatient Prescriptions  Medication Sig Dispense Refill  . AFLURIA PRESERVATIVE FREE 0.5 ML SUSY inject 0.5 milliliter intramuscularly  0  . aspirin 81 MG tablet Take 81 mg by mouth daily.    Marland Kitchen DROXIA 300 MG capsule Take 1 capsule by mouth daily.  0  . famotidine (PEPCID) 20 MG tablet Take 20 mg by mouth daily with supper.    . hydroxyurea (HYDREA) 500 MG capsule Take 300 mg by mouth daily. May take with food to minimize GI side effects.    Marland Kitchen ibuprofen (ADVIL,MOTRIN) 800 MG tablet Take 800 mg by mouth every 8  (eight) hours as needed.    . sildenafil (VIAGRA) 50 MG tablet Take 25 mg by mouth daily as needed for erectile dysfunction.     No current facility-administered medications for this visit.    PHYSICAL EXAMINATION: ECOG PERFORMANCE STATUS: 0 - Asymptomatic  BP 107/69 mmHg  Pulse 74  Temp(Src) 96.7 F (35.9 C) (Tympanic)  Resp 18  Ht 5\' 10"  (1.778 m)  Wt 164 lb 7.4 oz (74.6 kg)  BMI 23.60 kg/m2  Filed Weights   07/04/15 1128  Weight: 164 lb 7.4 oz (74.6 kg)    GENERAL: Well-nourished well-developed; Alert, no distress and comfortable.   Alone.  EYES: no pallor or icterus OROPHARYNX: no thrush or ulceration; good dentition  NECK: supple, no masses felt LYMPH:  no palpable lymphadenopathy in the cervical, axillary or inguinal regions LUNGS: clear to auscultation and  No wheeze or crackles HEART/CVS: regular rate & rhythm and no murmurs; No lower extremity edema ABDOMEN:abdomen soft, non-tender and normal bowel sounds Musculoskeletal:no cyanosis of digits and no clubbing  PSYCH: alert & oriented x 3 with fluent speech NEURO: no focal motor/sensory deficits SKIN:  no rashes or significant lesions  LABORATORY DATA:  I have reviewed the data as listed    Component Value Date/Time   CREATININE 0.78 07/04/2015 1109   CREATININE 0.86 02/28/2014 1450   PROT 6.7 07/04/2015 1109   PROT 7.4 02/28/2014 1450   ALBUMIN 4.0 07/04/2015 1109   ALBUMIN  3.1* 02/28/2014 1450   AST 19 07/04/2015 1109   AST 16 02/28/2014 1450   ALT 19 07/04/2015 1109   ALT 40 02/28/2014 1450   ALKPHOS 50 07/04/2015 1109   ALKPHOS 109 02/28/2014 1450   BILITOT 1.4* 07/04/2015 1109   BILITOT 0.5 02/28/2014 1450   GFRNONAA >60 07/04/2015 1109   GFRNONAA >60 02/28/2014 1450   GFRAA >60 07/04/2015 1109   GFRAA >60 02/28/2014 1450    No results found for: SPEP, UPEP  Lab Results  Component Value Date   WBC 6.3 07/04/2015   NEUTROABS 3.8 07/04/2015   HGB 15.2 07/04/2015   HCT 44.9 07/04/2015   MCV  97.2 07/04/2015   PLT 597* 07/04/2015      Chemistry      Component Value Date/Time   CREATININE 0.78 07/04/2015 1109   CREATININE 0.86 02/28/2014 1450      Component Value Date/Time   ALKPHOS 50 07/04/2015 1109   ALKPHOS 109 02/28/2014 1450   AST 19 07/04/2015 1109   AST 16 02/28/2014 1450   ALT 19 07/04/2015 1109   ALT 40 02/28/2014 1450   BILITOT 1.4* 07/04/2015 1109   BILITOT 0.5 02/28/2014 1450       RADIOGRAPHIC STUDIES: I have personally reviewed the radiological images as listed and agreed with the findings in the report. No results found.   ASSESSMENT & PLAN:   # Essential thrombocytosis- jack 2 positive. Patient is on Hydrea 300 mg today along with aspirin 80 mg a day. Patient's platelets have been in the range of 600-650 on the current dose. He is tolerating the current dose very well.   # I reviewed the natural history of essential thrombocytosis at length in detail. The goal of therapy is to avoid any thrombo-embolic events.  #I recommend increasing the dose of Hydrea to 300 mg twice a day; we'll continue baby aspirin. I reviewed the potential side effects of Hydrea including but not limited to Oral sores; sores on the feet; liver abnormalities etc.  # For now we will plan to repeat the labs in 2 months; follow-up with me in 4 months with labs again. He agrees.   No orders of the defined types were placed in this encounter.   All questions were answered. The patient knows to call the clinic with any problems, questions or concerns. No barriers to learning was detected.  I spent 25 minutes counseling the patient face to face. The total time spent in the appointment was 30 minutes and more than 50% was on counseling and review of test results     Cammie Sickle, MD 07/04/2015 11:37 AM

## 2015-08-01 ENCOUNTER — Inpatient Hospital Stay: Payer: BLUE CROSS/BLUE SHIELD

## 2015-08-01 ENCOUNTER — Telehealth: Payer: Self-pay | Admitting: *Deleted

## 2015-08-01 MED ORDER — HYDROXYUREA 500 MG PO CAPS: 500.0000 mg | ORAL_CAPSULE | Freq: Every day | ORAL | Status: DC

## 2015-08-01 NOTE — Telephone Encounter (Signed)
Pt had asked about changing his hydrea from 300 mg to 500 mg tablets.  He got nauseated on 500 mg and that is how we got to 300 mg tablets and he was taking 2 tablets a day and he is doing well on it. I spoke to [place] and he is ok with trying the 500 mg a day and see how it goes.  I spoke to pt and he only has 1 week left of 300 mg and then change to 500 mg.  I have called the medicine into his pharmacy.

## 2015-08-29 ENCOUNTER — Inpatient Hospital Stay: Payer: BLUE CROSS/BLUE SHIELD

## 2015-09-03 ENCOUNTER — Inpatient Hospital Stay: Payer: BLUE CROSS/BLUE SHIELD | Attending: Internal Medicine

## 2015-09-18 ENCOUNTER — Inpatient Hospital Stay: Payer: BLUE CROSS/BLUE SHIELD | Attending: Internal Medicine

## 2015-09-18 ENCOUNTER — Telehealth: Payer: Self-pay | Admitting: *Deleted

## 2015-09-18 DIAGNOSIS — D473 Essential (hemorrhagic) thrombocythemia: Secondary | ICD-10-CM | POA: Diagnosis not present

## 2015-09-18 LAB — CREATININE, SERUM
Creatinine, Ser: 0.83 mg/dL (ref 0.61–1.24)
GFR calc Af Amer: >60 mL/min (ref >60)
GFR calc non Af Amer: >60 mL/min (ref >60)

## 2015-09-18 LAB — HEPATIC FUNCTION PANEL
ALBUMIN: 4.2 g/dL (ref 3.5–5.0)
ALK PHOS: 55 U/L (ref 38–126)
ALT: 17 U/L (ref 17–63)
AST: 19 U/L (ref 15–41)
BILIRUBIN TOTAL: 1.6 mg/dL — AB (ref 0.3–1.2)
Bilirubin, Direct: 0.3 mg/dL (ref 0.1–0.5)
Indirect Bilirubin: 1.3 mg/dL — ABNORMAL HIGH (ref 0.3–0.9)
TOTAL PROTEIN: 6.8 g/dL (ref 6.5–8.1)

## 2015-09-18 LAB — CBC WITH DIFFERENTIAL/PLATELET
Basophils Absolute: 0.1 10*3/uL (ref 0–0.1)
Basophils Relative: 2 %
Eosinophils Absolute: 0.2 10*3/uL (ref 0–0.7)
Eosinophils Relative: 3 %
HEMATOCRIT: 44.5 % (ref 40.0–52.0)
HEMOGLOBIN: 15.2 g/dL (ref 13.0–18.0)
LYMPHS ABS: 1.4 10*3/uL (ref 1.0–3.6)
LYMPHS PCT: 22 %
MCH: 34.4 pg — AB (ref 26.0–34.0)
MCHC: 34.2 g/dL (ref 32.0–36.0)
MCV: 100.7 fL — ABNORMAL HIGH (ref 80.0–100.0)
MONOS PCT: 11 %
Monocytes Absolute: 0.7 10*3/uL (ref 0.2–1.0)
NEUTROS ABS: 3.8 10*3/uL (ref 1.4–6.5)
NEUTROS PCT: 62 %
Platelets: 515 10*3/uL — ABNORMAL HIGH (ref 150–440)
RBC: 4.42 MIL/uL (ref 4.40–5.90)
RDW: 15 % — ABNORMAL HIGH (ref 11.5–14.5)
WBC: 6.2 10*3/uL (ref 3.8–10.6)

## 2015-09-18 NOTE — Telephone Encounter (Signed)
Patient returned RN's call. Moishe Spice, RN discussed pt's care and results. Instructions were provided to continue same dose of Hydrea and to follow-up as scheduled.

## 2015-09-18 NOTE — Telephone Encounter (Signed)
Left msg for patient to contact to cancer center to discuss results.

## 2015-09-18 NOTE — Telephone Encounter (Signed)
-----   Message from Cammie Sickle, MD sent at 09/18/2015  3:13 PM EST ----- Please inform patient that his labs are stable; continue current dose of Hydrea. Follow-up as planned.

## 2015-09-26 ENCOUNTER — Ambulatory Visit: Payer: BLUE CROSS/BLUE SHIELD | Admitting: Internal Medicine

## 2015-09-26 ENCOUNTER — Other Ambulatory Visit: Payer: BLUE CROSS/BLUE SHIELD

## 2015-10-31 ENCOUNTER — Inpatient Hospital Stay: Payer: BLUE CROSS/BLUE SHIELD | Attending: Internal Medicine

## 2015-10-31 ENCOUNTER — Ambulatory Visit: Payer: BLUE CROSS/BLUE SHIELD | Admitting: Internal Medicine

## 2015-10-31 ENCOUNTER — Inpatient Hospital Stay (HOSPITAL_BASED_OUTPATIENT_CLINIC_OR_DEPARTMENT_OTHER): Payer: BLUE CROSS/BLUE SHIELD | Admitting: Internal Medicine

## 2015-10-31 ENCOUNTER — Other Ambulatory Visit: Payer: BLUE CROSS/BLUE SHIELD

## 2015-10-31 VITALS — BP 105/64 | HR 71 | Temp 96.9°F | Resp 16 | Wt 169.1 lb

## 2015-10-31 DIAGNOSIS — Z79899 Other long term (current) drug therapy: Secondary | ICD-10-CM | POA: Insufficient documentation

## 2015-10-31 DIAGNOSIS — Z7982 Long term (current) use of aspirin: Secondary | ICD-10-CM | POA: Diagnosis not present

## 2015-10-31 DIAGNOSIS — D473 Essential (hemorrhagic) thrombocythemia: Secondary | ICD-10-CM | POA: Insufficient documentation

## 2015-10-31 DIAGNOSIS — M1990 Unspecified osteoarthritis, unspecified site: Secondary | ICD-10-CM | POA: Diagnosis not present

## 2015-10-31 LAB — CBC WITH DIFFERENTIAL/PLATELET
Basophils Absolute: 0.1 10*3/uL (ref 0–0.1)
Basophils Relative: 1 %
EOS ABS: 0.2 10*3/uL (ref 0–0.7)
EOS PCT: 2 %
HCT: 42.8 % (ref 40.0–52.0)
HEMOGLOBIN: 14.8 g/dL (ref 13.0–18.0)
LYMPHS ABS: 1.4 10*3/uL (ref 1.0–3.6)
Lymphocytes Relative: 23 %
MCH: 35.1 pg — AB (ref 26.0–34.0)
MCHC: 34.5 g/dL (ref 32.0–36.0)
MCV: 101.5 fL — AB (ref 80.0–100.0)
MONOS PCT: 12 %
Monocytes Absolute: 0.7 10*3/uL (ref 0.2–1.0)
NEUTROS PCT: 62 %
Neutro Abs: 3.9 10*3/uL (ref 1.4–6.5)
Platelets: 498 10*3/uL — ABNORMAL HIGH (ref 150–440)
RBC: 4.21 MIL/uL — AB (ref 4.40–5.90)
RDW: 13.5 % (ref 11.5–14.5)
WBC: 6.3 10*3/uL (ref 3.8–10.6)

## 2015-10-31 LAB — HEPATIC FUNCTION PANEL
ALBUMIN: 4 g/dL (ref 3.5–5.0)
ALK PHOS: 58 U/L (ref 38–126)
ALT: 18 U/L (ref 17–63)
AST: 25 U/L (ref 15–41)
BILIRUBIN TOTAL: 1.2 mg/dL (ref 0.3–1.2)
Bilirubin, Direct: 0.2 mg/dL (ref 0.1–0.5)
Indirect Bilirubin: 1 mg/dL — ABNORMAL HIGH (ref 0.3–0.9)
TOTAL PROTEIN: 7 g/dL (ref 6.5–8.1)

## 2015-10-31 LAB — CREATININE, SERUM
Creatinine, Ser: 0.82 mg/dL (ref 0.61–1.24)
GFR calc Af Amer: >60 mL/min (ref >60)
GFR calc non Af Amer: >60 mL/min (ref >60)

## 2015-10-31 NOTE — Progress Notes (Signed)
Patient forgot to the [facility] yesterday.

## 2015-10-31 NOTE — Progress Notes (Signed)
[facility] OFFICE PROGRESS NOTE  Patient Care Team: No Pcp Per Patient as PCP - General (General Practice)   SUMMARY OF ONCOLOGIC HISTORY:  # 2011- ESSENTIAL THROMBOCYTOSIS [Jak-2 pos; incidental]  Summer 2015- Start hydrea;Asprin 81 mg/d; NOV 2016- START hydrea 500/day ]  INTERVAL HISTORY:  A very pleasant 60 year-old male patient with above history of essential thrombocytosis currently on low-dose Hydrea 500 mg once a day is here for follow-up.    Patient denies any unusual pain in fingertips or toes. Denies any history of strokes or blood clots. Denies any nausea vomiting. Appetite is good. Denies any bleeding.  REVIEW OF SYSTEMS:  A complete 10 point review of system is done which is negative except mentioned above/history of present illness.   PAST MEDICAL HISTORY :  Past Medical History  Diagnosis Date  . Arthritis     PAST SURGICAL HISTORY :   Past Surgical History  Procedure Laterality Date  . Back surgery  June 2015    FAMILY HISTORY :   Family History  Problem Relation Age of Onset  . Cancer Maternal Aunt   . Cancer Paternal Aunt   . Stroke Maternal Grandfather     SOCIAL HISTORY:   Social History  Substance Use Topics  . Smoking status: Never Smoker   . Smokeless tobacco: Never Used  . Alcohol Use: Not on file    ALLERGIES:  is allergic to penicillins.  MEDICATIONS:  Current Outpatient Prescriptions  Medication Sig Dispense Refill  . aspirin 81 MG tablet Take 81 mg by mouth daily.    Marland Kitchen DROXIA 300 MG capsule Take 1 capsule (300 mg total) by mouth 2 (two) times daily. 60 capsule 4  . famotidine (PEPCID) 20 MG tablet Take 20 mg by mouth daily with supper.    . hydroxyurea (HYDREA) 500 MG capsule Take 1 capsule (500 mg total) by mouth daily. May take with food to minimize GI side effects. 30 capsule 3  . ibuprofen (ADVIL,MOTRIN) 800 MG tablet Take 800 mg by mouth every 8 (eight) hours as needed.    Marland Kitchen VIAGRA 100 MG tablet   0   No  current facility-administered medications for this visit.    PHYSICAL EXAMINATION: ECOG PERFORMANCE STATUS: 0 - Asymptomatic  BP 105/64 mmHg  Pulse 71  Temp(Src) 96.9 F (36.1 C) (Tympanic)  Resp 16  Wt 169 lb 1.5 oz (76.7 kg)  Filed Weights   10/31/15 1340  Weight: 169 lb 1.5 oz (76.7 kg)    GENERAL: Well-nourished well-developed; Alert, no distress and comfortable.   Alone.  EYES: no pallor or icterus OROPHARYNX: no thrush or ulceration; good dentition  NECK: supple, no masses felt LYMPH:  no palpable lymphadenopathy in the cervical, axillary or inguinal regions LUNGS: clear to auscultation and  No wheeze or crackles HEART/CVS: regular rate & rhythm and no murmurs; No lower extremity edema ABDOMEN:abdomen soft, non-tender and normal bowel sounds Musculoskeletal:no cyanosis of digits and no clubbing  PSYCH: alert & oriented x 3 with fluent speech NEURO: no focal motor/sensory deficits SKIN:  no rashes or significant lesions  LABORATORY DATA:  I have reviewed the data as listed    Component Value Date/Time   CREATININE 0.82 10/31/2015 1325   CREATININE 0.86 02/28/2014 1450   PROT 7.0 10/31/2015 1325   PROT 7.4 02/28/2014 1450   ALBUMIN 4.0 10/31/2015 1325   ALBUMIN 3.1* 02/28/2014 1450   AST 25 10/31/2015 1325   AST 16 02/28/2014 1450   ALT 18  10/31/2015 1325   ALT 40 02/28/2014 1450   ALKPHOS 58 10/31/2015 1325   ALKPHOS 109 02/28/2014 1450   BILITOT 1.2 10/31/2015 1325   BILITOT 0.5 02/28/2014 1450   GFRNONAA >60 10/31/2015 1325   GFRNONAA >60 02/28/2014 1450   GFRAA >60 10/31/2015 1325   GFRAA >60 02/28/2014 1450    No results found for: SPEP, UPEP  Lab Results  Component Value Date   WBC 6.3 10/31/2015   NEUTROABS 3.9 10/31/2015   HGB 14.8 10/31/2015   HCT 42.8 10/31/2015   MCV 101.5* 10/31/2015   PLT 498* 10/31/2015      Chemistry      Component Value Date/Time   CREATININE 0.82 10/31/2015 1325   CREATININE 0.86 02/28/2014 1450       Component Value Date/Time   ALKPHOS 58 10/31/2015 1325   ALKPHOS 109 02/28/2014 1450   AST 25 10/31/2015 1325   AST 16 02/28/2014 1450   ALT 18 10/31/2015 1325   ALT 40 02/28/2014 1450   BILITOT 1.2 10/31/2015 1325   BILITOT 0.5 02/28/2014 1450       ASSESSMENT & PLAN:   # Essential thrombocytosis- jack 2 positive. Patient is on Hydrea 500 mg today along with aspirin 81 mg a day.   # I reviewed the natural history of essential thrombocytosis at length in detail. The goal of therapy is to avoid any thrombo-embolic events. Today platelet count is 490 I would continue Hydrea at the current dose of 500 mg once a day. The goal is around platelet count 450.   # Check labs CBC CMP in 3 months; follow-up in 6 months with labs.   # 15 minutes face-to-face with the patient discussing the above plan of care; more than 50% of time spent on natural history; counseling and coordination.     Cammie Sickle, MD 10/31/2015 2:06 PM

## 2015-11-04 ENCOUNTER — Encounter (HOSPITAL_COMMUNITY): Payer: Self-pay | Admitting: Orthopedic Surgery

## 2015-11-14 DIAGNOSIS — D473 Essential (hemorrhagic) thrombocythemia: Secondary | ICD-10-CM | POA: Insufficient documentation

## 2015-11-14 DIAGNOSIS — D75839 Thrombocytosis, unspecified: Secondary | ICD-10-CM | POA: Insufficient documentation

## 2015-11-14 DIAGNOSIS — N529 Male erectile dysfunction, unspecified: Secondary | ICD-10-CM | POA: Insufficient documentation

## 2015-12-14 ENCOUNTER — Other Ambulatory Visit: Payer: Self-pay | Admitting: Internal Medicine

## 2016-02-02 ENCOUNTER — Inpatient Hospital Stay: Payer: BLUE CROSS/BLUE SHIELD | Attending: Internal Medicine

## 2016-02-02 DIAGNOSIS — D473 Essential (hemorrhagic) thrombocythemia: Secondary | ICD-10-CM | POA: Insufficient documentation

## 2016-02-02 LAB — CBC WITH DIFFERENTIAL/PLATELET
Basophils Absolute: 0 10*3/uL (ref 0–0.1)
Basophils Relative: 1 %
EOS PCT: 2 %
Eosinophils Absolute: 0.1 10*3/uL (ref 0–0.7)
HEMATOCRIT: 44.2 % (ref 40.0–52.0)
Hemoglobin: 15.1 g/dL (ref 13.0–18.0)
Lymphocytes Relative: 16 %
Lymphs Abs: 1.1 10*3/uL (ref 1.0–3.6)
MCH: 34.7 pg — AB (ref 26.0–34.0)
MCHC: 34.2 g/dL (ref 32.0–36.0)
MCV: 101.5 fL — ABNORMAL HIGH (ref 80.0–100.0)
MONO ABS: 1.2 10*3/uL — AB (ref 0.2–1.0)
Monocytes Relative: 18 %
NEUTROS ABS: 4.3 10*3/uL (ref 1.4–6.5)
Neutrophils Relative %: 63 %
PLATELETS: 464 10*3/uL — AB (ref 150–440)
RBC: 4.35 MIL/uL — ABNORMAL LOW (ref 4.40–5.90)
RDW: 13.4 % (ref 11.5–14.5)
WBC: 6.8 10*3/uL (ref 3.8–10.6)

## 2016-02-02 LAB — COMPREHENSIVE METABOLIC PANEL
ALT: 21 U/L (ref 17–63)
AST: 25 U/L (ref 15–41)
Albumin: 4.2 g/dL (ref 3.5–5.0)
Alkaline Phosphatase: 65 U/L (ref 38–126)
Anion gap: 6 (ref 5–15)
BILIRUBIN TOTAL: 1.3 mg/dL — AB (ref 0.3–1.2)
BUN: 19 mg/dL (ref 6–20)
CALCIUM: 9.2 mg/dL (ref 8.9–10.3)
CHLORIDE: 100 mmol/L — AB (ref 101–111)
CO2: 29 mmol/L (ref 22–32)
CREATININE: 0.88 mg/dL (ref 0.61–1.24)
Glucose, Bld: 117 mg/dL — ABNORMAL HIGH (ref 65–99)
Potassium: 3.8 mmol/L (ref 3.5–5.1)
Sodium: 135 mmol/L (ref 135–145)
Total Protein: 7 g/dL (ref 6.5–8.1)

## 2016-02-03 ENCOUNTER — Telehealth: Payer: Self-pay | Admitting: *Deleted

## 2016-02-03 NOTE — Telephone Encounter (Signed)
Called patient and LVM that platelets are 464. Patient should continue hydrea at current dose and follow up with MD as planned.

## 2016-02-03 NOTE — Telephone Encounter (Signed)
-----   Message from Cammie Sickle, MD sent at 02/02/2016  4:07 PM EDT ----- Please inform pt re: platelet counts- continue hydrea at current dose; follow up as planned. DrB

## 2016-04-11 ENCOUNTER — Other Ambulatory Visit: Payer: Self-pay | Admitting: Internal Medicine

## 2016-05-03 ENCOUNTER — Inpatient Hospital Stay: Payer: BLUE CROSS/BLUE SHIELD

## 2016-05-03 ENCOUNTER — Inpatient Hospital Stay: Payer: BLUE CROSS/BLUE SHIELD | Attending: Internal Medicine | Admitting: Internal Medicine

## 2016-05-03 VITALS — BP 109/75 | HR 70 | Temp 97.8°F | Wt 174.4 lb

## 2016-05-03 DIAGNOSIS — Z7901 Long term (current) use of anticoagulants: Secondary | ICD-10-CM | POA: Diagnosis not present

## 2016-05-03 DIAGNOSIS — Z808 Family history of malignant neoplasm of other organs or systems: Secondary | ICD-10-CM

## 2016-05-03 DIAGNOSIS — R17 Unspecified jaundice: Secondary | ICD-10-CM | POA: Diagnosis not present

## 2016-05-03 DIAGNOSIS — Z79899 Other long term (current) drug therapy: Secondary | ICD-10-CM | POA: Insufficient documentation

## 2016-05-03 DIAGNOSIS — Z8701 Personal history of pneumonia (recurrent): Secondary | ICD-10-CM | POA: Insufficient documentation

## 2016-05-03 DIAGNOSIS — D473 Essential (hemorrhagic) thrombocythemia: Secondary | ICD-10-CM | POA: Insufficient documentation

## 2016-05-03 DIAGNOSIS — M1990 Unspecified osteoarthritis, unspecified site: Secondary | ICD-10-CM | POA: Insufficient documentation

## 2016-05-03 DIAGNOSIS — K219 Gastro-esophageal reflux disease without esophagitis: Secondary | ICD-10-CM | POA: Diagnosis not present

## 2016-05-03 DIAGNOSIS — Z7982 Long term (current) use of aspirin: Secondary | ICD-10-CM | POA: Diagnosis not present

## 2016-05-03 LAB — COMPREHENSIVE METABOLIC PANEL
ALT: 19 U/L (ref 17–63)
ANION GAP: 6 (ref 5–15)
AST: 24 U/L (ref 15–41)
Albumin: 4.1 g/dL (ref 3.5–5.0)
Alkaline Phosphatase: 49 U/L (ref 38–126)
BUN: 17 mg/dL (ref 6–20)
CO2: 27 mmol/L (ref 22–32)
Calcium: 8.9 mg/dL (ref 8.9–10.3)
Chloride: 102 mmol/L (ref 101–111)
Creatinine, Ser: 0.73 mg/dL (ref 0.61–1.24)
GFR calc Af Amer: >60 mL/min (ref >60)
GFR calc non Af Amer: >60 mL/min (ref >60)
GLUCOSE: 93 mg/dL (ref 65–99)
POTASSIUM: 4.1 mmol/L (ref 3.5–5.1)
Sodium: 135 mmol/L (ref 135–145)
TOTAL PROTEIN: 6.8 g/dL (ref 6.5–8.1)
Total Bilirubin: 1.6 mg/dL — ABNORMAL HIGH (ref 0.3–1.2)

## 2016-05-03 LAB — CBC WITH DIFFERENTIAL/PLATELET
BASOS PCT: 2 %
Basophils Absolute: 0.1 10*3/uL (ref 0–0.1)
EOS PCT: 2 %
Eosinophils Absolute: 0.1 10*3/uL (ref 0–0.7)
HEMATOCRIT: 42.1 % (ref 40.0–52.0)
Hemoglobin: 14.5 g/dL (ref 13.0–18.0)
Lymphocytes Relative: 26 %
Lymphs Abs: 1.6 10*3/uL (ref 1.0–3.6)
MCH: 35.4 pg — ABNORMAL HIGH (ref 26.0–34.0)
MCHC: 34.5 g/dL (ref 32.0–36.0)
MCV: 102.5 fL — ABNORMAL HIGH (ref 80.0–100.0)
MONO ABS: 0.7 10*3/uL (ref 0.2–1.0)
MONOS PCT: 11 %
NEUTROS ABS: 3.6 10*3/uL (ref 1.4–6.5)
Neutrophils Relative %: 59 %
Platelets: 482 10*3/uL — ABNORMAL HIGH (ref 150–440)
RBC: 4.11 MIL/uL — ABNORMAL LOW (ref 4.40–5.90)
RDW: 14.8 % — AB (ref 11.5–14.5)
WBC: 6.1 10*3/uL (ref 3.8–10.6)

## 2016-05-03 MED ORDER — HYDROXYUREA 500 MG PO CAPS: 500.0000 mg | ORAL_CAPSULE | Freq: Every day | ORAL | 3 refills | Status: DC

## 2016-05-03 NOTE — Assessment & Plan Note (Addendum)
#   Essential thrombocytosis- jack 2 positive. Patient is on Hydrea 500 mg/once a day with aspirin 81 mg a day. Platelets today 480 stable  # Slightly elevated bilirubin 1.6 normal 1.3 normal AST ALT. Intermittent.  # EGD- planned in 10 days.   #  follow-up in 6 months with labs.

## 2016-05-03 NOTE — Progress Notes (Signed)
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    Long-standing history of reflux disease is not improving. Awaiting upper endoscopy in approximately 10 days. Otherwise denies any unusual pain in fingertips or toes. Denies any history of strokes or blood clots. Denies any nausea vomiting. Appetite is good. Denies any bleeding.  REVIEW OF SYSTEMS:  A complete 10 point review of system is done which is negative except mentioned above/history of present illness.   PAST MEDICAL HISTORY :  Past Medical History:  Diagnosis Date  . Arthritis    osteoarthritis -hips,spine,hands and left wrist.  . Arthritis   . Complication of anesthesia   . GERD (gastroesophageal reflux disease)   . Pneumonia   . PONV (postoperative nausea and vomiting)   . Thrombocytosis ([place])   . Unspecified diseases of blood and blood-forming organs    "essential hyperthrombocytosis"    PAST SURGICAL HISTORY :   Past Surgical History:  Procedure Laterality Date  . BACK SURGERY  June 2015  . ROTATOR CUFF REPAIR     x3(lt x2), rt. x1  . TOTAL HIP ARTHROPLASTY Left 02/11/2014   Procedure: LEFT TOTAL HIP ARTHROPLASTY ANTERIOR APPROACH;  Surgeon: Gearlean Alf, MD;  Location: [facility];  Service: Orthopedics;  Laterality: Left;    FAMILY HISTORY :   Family History  Problem Relation Age of Onset  . Cancer Maternal Aunt   . Cancer Paternal Aunt   . Stroke Maternal Grandfather     SOCIAL HISTORY:   Social History  Substance Use Topics  . Smoking status: Never  Smoker  . Smokeless tobacco: Never Used  . Alcohol use 1.2 oz/week    2 Cans of beer per week     Comment: beer/ wine 2glasses  per night    ALLERGIES:  is allergic to vicodin [hydrocodone-acetaminophen] and penicillins.  MEDICATIONS:  Current Outpatient Prescriptions  Medication Sig Dispense Refill  . aspirin 81 MG tablet Take 81 mg by mouth daily.    . cyclobenzaprine (FLEXERIL) 10 MG tablet Take 1 tablet (10 mg total) by mouth 3 (three) times daily as needed for muscle spasms. 90 tablet 0  . hydroxyurea (HYDREA) 500 MG capsule Take 1 capsule (500 mg total) by mouth daily. May take with food to minimize GI side effects. 90 capsule 3  . ibuprofen (ADVIL,MOTRIN) 800 MG tablet Take 800 mg by mouth every 8 (eight) hours as needed.    . ondansetron (ZOFRAN) 4 MG tablet Take 1 tablet (4 mg total) by mouth every 6 (six) hours as needed for nausea. 40 tablet 0  . oxyCODONE (OXY IR/ROXICODONE) 5 MG immediate release tablet Take 1-2 tablets (5-10 mg total) by mouth every 3 (three) hours as needed for moderate pain, severe pain or breakthrough pain. 8 tablet 0  . traMADol (ULTRAM) 50 MG tablet Take 1-2 tablets (50-100 mg total) by mouth every 6 (six) hours as needed (mild to moderate pain). 60 tablet 1  . VIAGRA 100 MG tablet   0  . famotidine (PEPCID) 20 MG tablet Take 20 mg by mouth daily  with supper.    . Melatonin 10 MG TABS Take by mouth.    . rivaroxaban (XARELTO) 10 MG TABS tablet Take 1 tablet (10 mg total) by mouth daily with breakfast. (Patient not taking: Reported on 05/03/2016) 19 tablet 0   No current facility-administered medications for this visit.     PHYSICAL EXAMINATION: ECOG PERFORMANCE STATUS: 0 - Asymptomatic  BP 109/75 (BP Location: Right Arm, Patient Position: Sitting)   Pulse 70   Temp 97.8 F (36.6 C) (Tympanic)   Wt 174 lb 6.4 oz (79.1 kg)   BMI 25.02 kg/m   Filed Weights   05/03/16 1208  Weight: 174 lb 6.4 oz (79.1 kg)    GENERAL: Well-nourished  well-developed; Alert, no distress and comfortable.   Alone.  EYES: no pallor or icterus OROPHARYNX: no thrush or ulceration; good dentition  NECK: supple, no masses felt LYMPH:  no palpable lymphadenopathy in the cervical, axillary or inguinal regions LUNGS: clear to auscultation and  No wheeze or crackles HEART/CVS: regular rate & rhythm and no murmurs; No lower extremity edema ABDOMEN:abdomen soft, non-tender and normal bowel sounds Musculoskeletal:no cyanosis of digits and no clubbing  PSYCH: alert & oriented x 3 with fluent speech NEURO: no focal motor/sensory deficits SKIN:  no rashes or significant lesions  LABORATORY DATA:  I have reviewed the data as listed    Component Value Date/Time   NA 135 05/03/2016 1133   K 4.1 05/03/2016 1133   CL 102 05/03/2016 1133   CO2 27 05/03/2016 1133   GLUCOSE 93 05/03/2016 1133   BUN 17 05/03/2016 1133   CREATININE 0.73 05/03/2016 1133   CREATININE 0.86 02/28/2014 1450   CALCIUM 8.9 05/03/2016 1133   PROT 6.8 05/03/2016 1133   PROT 7.4 02/28/2014 1450   ALBUMIN 4.1 05/03/2016 1133   ALBUMIN 3.1 (L) 02/28/2014 1450   AST 24 05/03/2016 1133   AST 16 02/28/2014 1450   ALT 19 05/03/2016 1133   ALT 40 02/28/2014 1450   ALKPHOS 49 05/03/2016 1133   ALKPHOS 109 02/28/2014 1450   BILITOT 1.6 (H) 05/03/2016 1133   BILITOT 0.5 02/28/2014 1450   GFRNONAA >60 05/03/2016 1133   GFRNONAA >60 02/28/2014 1450   GFRAA >60 05/03/2016 1133   GFRAA >60 02/28/2014 1450    No results found for: SPEP, UPEP  Lab Results  Component Value Date   WBC 6.1 05/03/2016   NEUTROABS 3.6 05/03/2016   HGB 14.5 05/03/2016   HCT 42.1 05/03/2016   MCV 102.5 (H) 05/03/2016   PLT 482 (H) 05/03/2016      Chemistry      Component Value Date/Time   NA 135 05/03/2016 1133   K 4.1 05/03/2016 1133   CL 102 05/03/2016 1133   CO2 27 05/03/2016 1133   BUN 17 05/03/2016 1133   CREATININE 0.73 05/03/2016 1133   CREATININE 0.86 02/28/2014 1450      Component  Value Date/Time   CALCIUM 8.9 05/03/2016 1133   ALKPHOS 49 05/03/2016 1133   ALKPHOS 109 02/28/2014 1450   AST 24 05/03/2016 1133   AST 16 02/28/2014 1450   ALT 19 05/03/2016 1133   ALT 40 02/28/2014 1450   BILITOT 1.6 (H) 05/03/2016 1133   BILITOT 0.5 02/28/2014 1450       ASSESSMENT & PLAN:   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 Patient is on Hydrea 500 mg/once a day with aspirin 81 mg a day. Platelets today 480 stable  # Slightly elevated bilirubin  1.6 normal 1.3 normal AST ALT. Intermittent.  # EGD- planned in 10 days.   #  follow-up in 6 months with labs.        Cammie Sickle, MD 05/03/2016 1:10 PM

## 2016-05-03 NOTE — Progress Notes (Signed)
Patient brought to exam room 18, ambulates independently.  Patient denies pain or discomfort at this time

## 2016-11-01 ENCOUNTER — Inpatient Hospital Stay (HOSPITAL_BASED_OUTPATIENT_CLINIC_OR_DEPARTMENT_OTHER): Payer: BLUE CROSS/BLUE SHIELD | Admitting: Internal Medicine

## 2016-11-01 ENCOUNTER — Inpatient Hospital Stay: Payer: BLUE CROSS/BLUE SHIELD | Attending: Internal Medicine

## 2016-11-01 VITALS — BP 108/60 | HR 71 | Temp 97.6°F | Resp 20 | Ht 70.0 in | Wt 177.6 lb

## 2016-11-01 DIAGNOSIS — K219 Gastro-esophageal reflux disease without esophagitis: Secondary | ICD-10-CM | POA: Insufficient documentation

## 2016-11-01 DIAGNOSIS — Z809 Family history of malignant neoplasm, unspecified: Secondary | ICD-10-CM

## 2016-11-01 DIAGNOSIS — M129 Arthropathy, unspecified: Secondary | ICD-10-CM | POA: Insufficient documentation

## 2016-11-01 DIAGNOSIS — D473 Essential (hemorrhagic) thrombocythemia: Secondary | ICD-10-CM | POA: Diagnosis not present

## 2016-11-01 DIAGNOSIS — Z79899 Other long term (current) drug therapy: Secondary | ICD-10-CM | POA: Insufficient documentation

## 2016-11-01 DIAGNOSIS — Z7982 Long term (current) use of aspirin: Secondary | ICD-10-CM | POA: Diagnosis not present

## 2016-11-01 LAB — CBC WITH DIFFERENTIAL/PLATELET
Basophils Absolute: 0.1 10*3/uL (ref 0–0.1)
Basophils Relative: 1 %
Eosinophils Absolute: 0.2 10*3/uL (ref 0–0.7)
Eosinophils Relative: 3 %
HEMATOCRIT: 41.9 % (ref 40.0–52.0)
Hemoglobin: 14.6 g/dL (ref 13.0–18.0)
Lymphocytes Relative: 32 %
Lymphs Abs: 2 10*3/uL (ref 1.0–3.6)
MCH: 35.1 pg — ABNORMAL HIGH (ref 26.0–34.0)
MCHC: 34.8 g/dL (ref 32.0–36.0)
MCV: 100.8 fL — AB (ref 80.0–100.0)
MONO ABS: 0.8 10*3/uL (ref 0.2–1.0)
Monocytes Relative: 12 %
NEUTROS ABS: 3.4 10*3/uL (ref 1.4–6.5)
Neutrophils Relative %: 52 %
Platelets: 554 10*3/uL — ABNORMAL HIGH (ref 150–440)
RBC: 4.16 MIL/uL — ABNORMAL LOW (ref 4.40–5.90)
RDW: 13.4 % (ref 11.5–14.5)
WBC: 6.4 10*3/uL (ref 3.8–10.6)

## 2016-11-01 LAB — COMPREHENSIVE METABOLIC PANEL
ALT: 22 U/L (ref 17–63)
ANION GAP: 6 (ref 5–15)
AST: 39 U/L (ref 15–41)
Albumin: 4 g/dL (ref 3.5–5.0)
Alkaline Phosphatase: 50 U/L (ref 38–126)
BILIRUBIN TOTAL: 2 mg/dL — AB (ref 0.3–1.2)
BUN: 25 mg/dL — ABNORMAL HIGH (ref 6–20)
CO2: 27 mmol/L (ref 22–32)
CREATININE: 0.88 mg/dL (ref 0.61–1.24)
Calcium: 8.9 mg/dL (ref 8.9–10.3)
Chloride: 102 mmol/L (ref 101–111)
GFR calc Af Amer: >60 mL/min (ref >60)
Glucose, Bld: 134 mg/dL — ABNORMAL HIGH (ref 65–99)
Potassium: 4.2 mmol/L (ref 3.5–5.1)
Sodium: 135 mmol/L (ref 135–145)
TOTAL PROTEIN: 6.9 g/dL (ref 6.5–8.1)

## 2016-11-01 NOTE — Assessment & Plan Note (Addendum)
#   Essential thrombocytosis- JAK-2 positive. Patient is on Hydrea 500 mg/once a day with aspirin 81 mg a day. Platelets today 554/ slightly elevated;   # Slightly elevated bilirubin 2.0 normal 1.3 normal AST ALT. Intermittent. Monitor for now.   # EGD- [Dr.Ganum; [place]]. On zantac.    #  follow-up in 6 months with labs.

## 2016-11-01 NOTE — Progress Notes (Signed)
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    In the interim he underwent endoscopy in [place]- no evidence of any Barrett's disease. He continues to be on Zantac for his reflux.  Otherwise denies any unusual pain in fingertips or toes. Denies any history of strokes or blood clots. Denies any nausea vomiting. Appetite is good. Denies any bleeding. Denies any abdominal pain. Or weight loss.   REVIEW OF SYSTEMS:  A complete 10 point review of system is done which is negative except mentioned above/history of present illness.   PAST MEDICAL HISTORY :  Past Medical History:  Diagnosis Date  . Arthritis    osteoarthritis -hips,spine,hands and left wrist.  . Arthritis   . Complication of anesthesia   . GERD (gastroesophageal reflux disease)   . Pneumonia   . PONV (postoperative nausea and vomiting)   . Thrombocytosis ([place])   . Unspecified diseases of blood and blood-forming organs    "essential hyperthrombocytosis"    PAST SURGICAL HISTORY :   Past Surgical History:  Procedure Laterality Date  . BACK SURGERY  June 2015  . ROTATOR CUFF REPAIR     x3(lt x2), rt. x1  . TOTAL HIP ARTHROPLASTY Left 02/11/2014   Procedure: LEFT TOTAL HIP ARTHROPLASTY ANTERIOR APPROACH;  Surgeon: Gearlean Alf, MD;  Location: [facility];  Service: Orthopedics;  Laterality: Left;    FAMILY HISTORY :   Family History  Problem Relation Age of Onset  . Cancer Maternal Aunt   . Cancer Paternal Aunt   . Stroke Maternal Grandfather     SOCIAL  HISTORY:   Social History  Substance Use Topics  . Smoking status: Never Smoker  . Smokeless tobacco: Never Used  . Alcohol use 1.2 oz/week    2 Cans of beer per week     Comment: beer/ wine 2glasses  per night    ALLERGIES:  is allergic to vicodin [hydrocodone-acetaminophen] and penicillins.  MEDICATIONS:  Current Outpatient Prescriptions  Medication Sig Dispense Refill  . aspirin 81 MG tablet Take 81 mg by mouth daily.    . hydroxyurea (HYDREA) 500 MG capsule Take 1 capsule (500 mg total) by mouth daily. May take with food to minimize GI side effects. 90 capsule 3  . Melatonin 10 MG TABS Take 1 tablet by mouth at bedtime.     . ranitidine (ZANTAC) 150 MG tablet Take 1 tablet by mouth 2 (two) times daily. gerd  0  . VIAGRA 100 MG tablet Take 100 mg by mouth as needed.   0  . famotidine (PEPCID) 20 MG tablet Take 20 mg by mouth daily with supper.    Marland Kitchen ibuprofen (ADVIL,MOTRIN) 800 MG tablet Take 800 mg by mouth every 8 (eight) hours as needed.     No current facility-administered medications for this visit.     PHYSICAL EXAMINATION: ECOG PERFORMANCE STATUS: 0 - Asymptomatic  BP 108/60 (BP Location: Right Arm, Patient Position: Sitting)   Pulse 71   Temp 97.6 F (36.4 C) (Tympanic)   Resp 20   Ht 5\' 10"  (1.778 m)  Wt 177 lb 9.6 oz (80.6 kg)   BMI 25.48 kg/m   Filed Weights   11/01/16 1108  Weight: 177 lb 9.6 oz (80.6 kg)    GENERAL: Well-nourished well-developed; Alert, no distress and comfortable.   Alone.  EYES: no pallor or icterus OROPHARYNX: no thrush or ulceration; good dentition  NECK: supple, no masses felt LYMPH:  no palpable lymphadenopathy in the cervical, axillary or inguinal regions LUNGS: clear to auscultation and  No wheeze or crackles HEART/CVS: regular rate & rhythm and no murmurs; No lower extremity edema ABDOMEN:abdomen soft, non-tender and normal bowel sounds Musculoskeletal:no cyanosis of digits and no clubbing  PSYCH: alert & oriented x 3 with  fluent speech NEURO: no focal motor/sensory deficits SKIN:  no rashes or significant lesions  LABORATORY DATA:  I have reviewed the data as listed    Component Value Date/Time   NA 135 11/01/2016 1043   K 4.2 11/01/2016 1043   CL 102 11/01/2016 1043   CO2 27 11/01/2016 1043   GLUCOSE 134 (H) 11/01/2016 1043   BUN 25 (H) 11/01/2016 1043   CREATININE 0.88 11/01/2016 1043   CREATININE 0.86 02/28/2014 1450   CALCIUM 8.9 11/01/2016 1043   PROT 6.9 11/01/2016 1043   PROT 7.4 02/28/2014 1450   ALBUMIN 4.0 11/01/2016 1043   ALBUMIN 3.1 (L) 02/28/2014 1450   AST 39 11/01/2016 1043   AST 16 02/28/2014 1450   ALT 22 11/01/2016 1043   ALT 40 02/28/2014 1450   ALKPHOS 50 11/01/2016 1043   ALKPHOS 109 02/28/2014 1450   BILITOT 2.0 (H) 11/01/2016 1043   BILITOT 0.5 02/28/2014 1450   GFRNONAA >60 11/01/2016 1043   GFRNONAA >60 02/28/2014 1450   GFRAA >60 11/01/2016 1043   GFRAA >60 02/28/2014 1450    No results found for: SPEP, UPEP  Lab Results  Component Value Date   WBC 6.4 11/01/2016   NEUTROABS 3.4 11/01/2016   HGB 14.6 11/01/2016   HCT 41.9 11/01/2016   MCV 100.8 (H) 11/01/2016   PLT 554 (H) 11/01/2016      Chemistry      Component Value Date/Time   NA 135 11/01/2016 1043   K 4.2 11/01/2016 1043   CL 102 11/01/2016 1043   CO2 27 11/01/2016 1043   BUN 25 (H) 11/01/2016 1043   CREATININE 0.88 11/01/2016 1043   CREATININE 0.86 02/28/2014 1450      Component Value Date/Time   CALCIUM 8.9 11/01/2016 1043   ALKPHOS 50 11/01/2016 1043   ALKPHOS 109 02/28/2014 1450   AST 39 11/01/2016 1043   AST 16 02/28/2014 1450   ALT 22 11/01/2016 1043   ALT 40 02/28/2014 1450   BILITOT 2.0 (H) 11/01/2016 1043   BILITOT 0.5 02/28/2014 1450       ASSESSMENT & PLAN:   Essential thrombocythemia ([place]) # Essential thrombocytosis- JAK-2 positive. Patient is on Hydrea 500 mg/once a day with aspirin 81 mg a day. Platelets today 554/ slightly elevated;   # Slightly elevated  bilirubin 2.0 normal 1.3 normal AST ALT. Intermittent. Monitor for now.   # EGD- [Dr.Ganum; [place]]. On zantac.    #  follow-up in 6 months with labs.        Cammie Sickle, MD 11/01/2016 1:00 PM

## 2016-11-01 NOTE — Progress Notes (Signed)
Patient is here for follow-up with Dr. Rogue Bussing for mgmt of thrombocytosis.

## 2016-11-29 ENCOUNTER — Ambulatory Visit: Payer: Self-pay | Admitting: Medical

## 2016-11-29 ENCOUNTER — Encounter: Payer: Self-pay | Admitting: Medical

## 2016-11-29 VITALS — BP 117/70 | HR 76 | Temp 97.5°F | Resp 16 | Ht 70.0 in | Wt 176.0 lb

## 2016-11-29 DIAGNOSIS — W57XXXA Bitten or stung by nonvenomous insect and other nonvenomous arthropods, initial encounter: Secondary | ICD-10-CM

## 2016-11-29 MED ORDER — DOXYCYCLINE HYCLATE 100 MG PO TABS: 100.0000 mg | ORAL_TABLET | Freq: Two times a day (BID) | ORAL | 0 refills | Status: DC

## 2016-11-29 NOTE — Progress Notes (Signed)
   Subjective:    Patient ID: Joshua Cabrera, male    DOB: 02-Aug-1956, 61 y.o.   MRN: [id number]  HPI   61 yo male pulled tick off of left hand between 4th an 5th digit webbing yesterday, complains of itching , no pain.  Tick bite area red and swelling into fingers and swelling into hand ( no redness into hand).  Patient works in the  [employer] and [employer]. Took Benadryl  25mg  last night slept well. and  Half a Benadryl  (12.5mg ) this morning. "people at work told me I needed to be seen". " It was a lone star tick".   Review of Systems  Constitutional: Negative for chills and unexpected weight change.  Musculoskeletal: Negative for myalgias.  Skin: Positive for wound.  Neurological: Negative for dizziness, light-headedness and headaches.  Hematological: Negative for adenopathy.  wound is where tick bit him. Had patient remove wedding ring due to finger swelling.   Objective:   Physical Exam  Constitutional: He is oriented to person, place, and time. He appears well-developed and well-nourished.  HENT:  Head: Normocephalic and atraumatic.  Eyes: EOM are normal. Pupils are equal, round, and reactive to light.  Musculoskeletal: Normal range of motion. He exhibits edema. He exhibits no tenderness or deformity.       Arms: Neurological: He is alert and oriented to person, place, and time.  Skin: No rash noted. There is erythema.  Psychiatric: He has a normal mood and affect. His behavior is normal.  Nursing note and vitals reviewed.  Bite located between  4 and 5th digits, no tick particles noted. Redness at the sight of the bite and into base of fingers  No redness in palm or dorsum of hand. Patient itching hand the whole time he is in the room. No rash present , no adenopathy present. Bandage placed at the bite sight with neosporin to the area.         Assessment & Plan:  Tick Bite on left hand prescribed Doxy 100 mg one twice daily x 10 days #20 no refill Ice and elevate  the hand as much as possible.  Clean twice daily with dilute soap and water to the site, neosporin to site and bandage to the area. otc Zyrtec (Claritin makes him nauseous) or Benadryl at night take as directed. Warned patient of sedation of Benadryl. Return to the clinic in 2 days for recheck. Reviewed symptoms of headache, rash , flu-like symptoms , fatgue,adenopathy or  bodyaches to return to the clinic if any of these occur.

## 2016-12-01 ENCOUNTER — Ambulatory Visit: Payer: Self-pay | Admitting: Medical

## 2016-12-01 VITALS — BP 118/60 | HR 91 | Temp 97.1°F

## 2016-12-01 DIAGNOSIS — W57XXXD Bitten or stung by nonvenomous insect and other nonvenomous arthropods, subsequent encounter: Secondary | ICD-10-CM

## 2016-12-01 NOTE — Progress Notes (Signed)
   Subjective:    Patient ID: Joshua Cabrera, male    DOB: 01/12/1956, 62 y.o.   MRN: [id number]  HPI 61 yo male  returns to [place] for  2 day follow up for tick bite to left hand between fingers 4 and 5. Still swollen but much improved. Easier to move fingers and make a fist. Still itchy some , using loratadine and benadryl at night.  Feels like it is much improved.    Review of Systems  Constitutional: Negative for chills and fever.  Redness decreased.  Swelling improved,no numbness or tingling.  Itchy but improved, some itching up into forearm. No rashes.     Objective:   Physical Exam Left hand significantly improved swelling and redness. Bite site wound with no discharge. FROM 2+ radial pulse. No rash present.       Assessment & Plan:  Tick bite- skin infection improved.  Continue Doxycycline 100 mg po twice daily till finished. Continue loratadine and benadryl  Take as directed. Return to the clinc as needed at any time. No questions at discharge. Neosporin placed at bite site and bandage to the area. Extra supplies given to patient.

## 2016-12-10 ENCOUNTER — Ambulatory Visit: Payer: Self-pay | Admitting: Medical

## 2017-05-05 ENCOUNTER — Inpatient Hospital Stay: Payer: BLUE CROSS/BLUE SHIELD

## 2017-05-05 ENCOUNTER — Inpatient Hospital Stay: Payer: BLUE CROSS/BLUE SHIELD | Admitting: Internal Medicine

## 2017-05-06 ENCOUNTER — Inpatient Hospital Stay: Payer: BLUE CROSS/BLUE SHIELD | Attending: Internal Medicine | Admitting: Internal Medicine

## 2017-05-06 ENCOUNTER — Inpatient Hospital Stay: Payer: BLUE CROSS/BLUE SHIELD

## 2017-05-06 VITALS — BP 113/73 | HR 98 | Temp 98.4°F | Resp 16 | Wt 173.0 lb

## 2017-05-06 DIAGNOSIS — D473 Essential (hemorrhagic) thrombocythemia: Secondary | ICD-10-CM | POA: Diagnosis not present

## 2017-05-06 DIAGNOSIS — K219 Gastro-esophageal reflux disease without esophagitis: Secondary | ICD-10-CM

## 2017-05-06 DIAGNOSIS — M1990 Unspecified osteoarthritis, unspecified site: Secondary | ICD-10-CM | POA: Diagnosis not present

## 2017-05-06 DIAGNOSIS — Z8701 Personal history of pneumonia (recurrent): Secondary | ICD-10-CM

## 2017-05-06 DIAGNOSIS — Z809 Family history of malignant neoplasm, unspecified: Secondary | ICD-10-CM

## 2017-05-06 DIAGNOSIS — R17 Unspecified jaundice: Secondary | ICD-10-CM | POA: Insufficient documentation

## 2017-05-06 DIAGNOSIS — Z79899 Other long term (current) drug therapy: Secondary | ICD-10-CM | POA: Diagnosis not present

## 2017-05-06 LAB — CBC WITH DIFFERENTIAL/PLATELET
BASOS ABS: 0.1 10*3/uL (ref 0–0.1)
Basophils Relative: 2 %
EOS ABS: 0.1 10*3/uL (ref 0–0.7)
EOS PCT: 2 %
HCT: 40.9 % (ref 40.0–52.0)
HEMOGLOBIN: 14.4 g/dL (ref 13.0–18.0)
LYMPHS ABS: 1.7 10*3/uL (ref 1.0–3.6)
Lymphocytes Relative: 31 %
MCH: 35.9 pg — ABNORMAL HIGH (ref 26.0–34.0)
MCHC: 35.2 g/dL (ref 32.0–36.0)
MCV: 101.9 fL — ABNORMAL HIGH (ref 80.0–100.0)
Monocytes Absolute: 0.7 10*3/uL (ref 0.2–1.0)
Monocytes Relative: 12 %
Neutro Abs: 2.9 10*3/uL (ref 1.4–6.5)
Neutrophils Relative %: 53 %
PLATELETS: 544 10*3/uL — AB (ref 150–440)
RBC: 4.01 MIL/uL — AB (ref 4.40–5.90)
RDW: 13.4 % (ref 11.5–14.5)
WBC: 5.5 10*3/uL (ref 3.8–10.6)

## 2017-05-06 LAB — COMPREHENSIVE METABOLIC PANEL
ALK PHOS: 45 U/L (ref 38–126)
ALT: 18 U/L (ref 17–63)
AST: 24 U/L (ref 15–41)
Albumin: 3.9 g/dL (ref 3.5–5.0)
Anion gap: 5 (ref 5–15)
BUN: 16 mg/dL (ref 6–20)
CALCIUM: 9.1 mg/dL (ref 8.9–10.3)
CHLORIDE: 102 mmol/L (ref 101–111)
CO2: 27 mmol/L (ref 22–32)
CREATININE: 0.8 mg/dL (ref 0.61–1.24)
GFR calc Af Amer: >60 mL/min (ref >60)
GFR calc non Af Amer: >60 mL/min (ref >60)
GLUCOSE: 104 mg/dL — AB (ref 65–99)
Potassium: 4.4 mmol/L (ref 3.5–5.1)
SODIUM: 134 mmol/L — AB (ref 135–145)
Total Bilirubin: 1.5 mg/dL — ABNORMAL HIGH (ref 0.3–1.2)
Total Protein: 6.6 g/dL (ref 6.5–8.1)

## 2017-05-06 MED ORDER — HYDROXYUREA 500 MG PO CAPS: 500.0000 mg | ORAL_CAPSULE | Freq: Every day | ORAL | 3 refills | Status: DC

## 2017-05-06 NOTE — Assessment & Plan Note (Addendum)
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 Patient is on Hydrea 500 mg/once a day with aspirin 81 mg a day. Platelets today 544/ slightly elevated. New scipt given.   # Slightly elevated bilirubin 1.5 normal 1.3 normal AST ALT. Intermittent. Monitor for now.   # EGD- [Dr.Ganum; [place]]. On zantac.    #  follow-up in 12 months with labs' 6 months/labs. [pt pref]

## 2017-05-06 NOTE — Progress Notes (Signed)
Patient is here today for a follow up. Patient states no new concerns today.  

## 2017-05-06 NOTE — Progress Notes (Signed)
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    Patient denies any unusual pain in fingertips or toes. Denies any history of strokes or blood clots. Denies any nausea vomiting. Appetite is good. Denies any bleeding. Denies any abdominal pain. Or weight loss.   REVIEW OF SYSTEMS:  A complete 10 point review of system is done which is negative except mentioned above/history of present illness.   PAST MEDICAL HISTORY :  Past Medical History:  Diagnosis Date  . Arthritis    osteoarthritis -hips,spine,hands and left wrist.  . Arthritis   . Complication of anesthesia   . GERD (gastroesophageal reflux disease)   . Pneumonia   . PONV (postoperative nausea and vomiting)   . Thrombocytosis ([place])   . Unspecified diseases of blood and blood-forming organs    "essential hyperthrombocytosis"    PAST SURGICAL HISTORY :   Past Surgical History:  Procedure Laterality Date  . BACK SURGERY  June 2015  . ROTATOR CUFF REPAIR     x3(lt x2), rt. x1  . TOTAL HIP ARTHROPLASTY Left 02/11/2014   Procedure: LEFT TOTAL HIP ARTHROPLASTY ANTERIOR APPROACH;  Surgeon: Gearlean Alf, MD;  Location: [facility];  Service: Orthopedics;  Laterality: Left;    FAMILY HISTORY :   Family History  Problem Relation Age of Onset  . Cancer Maternal Aunt   . Cancer Paternal Aunt   . Stroke Maternal Grandfather     SOCIAL HISTORY:   Social History  Substance Use Topics  . Smoking status: Never  Smoker  . Smokeless tobacco: Never Used  . Alcohol use 1.2 oz/week    2 Cans of beer per week     Comment: beer/ wine 2glasses  per night    ALLERGIES:  is allergic to vicodin [hydrocodone-acetaminophen] and penicillins.  MEDICATIONS:  Current Outpatient Prescriptions  Medication Sig Dispense Refill  . aspirin 81 MG tablet Take 81 mg by mouth daily.    . hydroxyurea (HYDREA) 500 MG capsule Take 1 capsule (500 mg total) by mouth daily. May take with food to minimize GI side effects. 90 capsule 3  . ibuprofen (ADVIL,MOTRIN) 800 MG tablet Take 800 mg by mouth every 8 (eight) hours as needed.    . ranitidine (ZANTAC) 150 MG tablet Take 1 tablet by mouth 2 (two) times daily. gerd  0  . doxycycline (VIBRA-TABS) 100 MG tablet Take 1 tablet (100 mg total) by mouth 2 (two) times daily. (Patient not taking: Reported on 05/06/2017) 20 tablet 0  . Melatonin 10 MG TABS Take 1 tablet by mouth at bedtime.     Marland Kitchen VIAGRA 100 MG tablet Take 100 mg by mouth as needed.   0   No current facility-administered medications for this visit.     PHYSICAL EXAMINATION: ECOG PERFORMANCE STATUS: 0 - Asymptomatic  BP 113/73 (BP Location: Left Arm, Patient Position: Sitting)   Pulse 98   Temp 98.4 F (36.9 C) (Tympanic)   Resp 16   Wt 173 lb (  78.5 kg)   BMI 24.82 kg/m   Filed Weights   05/06/17 1017  Weight: 173 lb (78.5 kg)    GENERAL: Well-nourished well-developed; Alert, no distress and comfortable.   Alone.  EYES: no pallor or icterus OROPHARYNX: no thrush or ulceration; good dentition  NECK: supple, no masses felt LYMPH:  no palpable lymphadenopathy in the cervical, axillary or inguinal regions LUNGS: clear to auscultation and  No wheeze or crackles HEART/CVS: regular rate & rhythm and no murmurs; No lower extremity edema ABDOMEN:abdomen soft, non-tender and normal bowel sounds Musculoskeletal:no cyanosis of digits and no clubbing  PSYCH: alert & oriented x 3 with fluent speech NEURO: no focal  motor/sensory deficits SKIN:  no rashes or significant lesions  LABORATORY DATA:  I have reviewed the data as listed    Component Value Date/Time   NA 134 (L) 05/06/2017 1001   K 4.4 05/06/2017 1001   CL 102 05/06/2017 1001   CO2 27 05/06/2017 1001   GLUCOSE 104 (H) 05/06/2017 1001   BUN 16 05/06/2017 1001   CREATININE 0.80 05/06/2017 1001   CREATININE 0.86 02/28/2014 1450   CALCIUM 9.1 05/06/2017 1001   PROT 6.6 05/06/2017 1001   PROT 7.4 02/28/2014 1450   ALBUMIN 3.9 05/06/2017 1001   ALBUMIN 3.1 (L) 02/28/2014 1450   AST 24 05/06/2017 1001   AST 16 02/28/2014 1450   ALT 18 05/06/2017 1001   ALT 40 02/28/2014 1450   ALKPHOS 45 05/06/2017 1001   ALKPHOS 109 02/28/2014 1450   BILITOT 1.5 (H) 05/06/2017 1001   BILITOT 0.5 02/28/2014 1450   GFRNONAA >60 05/06/2017 1001   GFRNONAA >60 02/28/2014 1450   GFRAA >60 05/06/2017 1001   GFRAA >60 02/28/2014 1450    No results found for: SPEP, UPEP  Lab Results  Component Value Date   WBC 5.5 05/06/2017   NEUTROABS 2.9 05/06/2017   HGB 14.4 05/06/2017   HCT 40.9 05/06/2017   MCV 101.9 (H) 05/06/2017   PLT 544 (H) 05/06/2017      Chemistry      Component Value Date/Time   NA 134 (L) 05/06/2017 1001   K 4.4 05/06/2017 1001   CL 102 05/06/2017 1001   CO2 27 05/06/2017 1001   BUN 16 05/06/2017 1001   CREATININE 0.80 05/06/2017 1001   CREATININE 0.86 02/28/2014 1450      Component Value Date/Time   CALCIUM 9.1 05/06/2017 1001   ALKPHOS 45 05/06/2017 1001   ALKPHOS 109 02/28/2014 1450   AST 24 05/06/2017 1001   AST 16 02/28/2014 1450   ALT 18 05/06/2017 1001   ALT 40 02/28/2014 1450   BILITOT 1.5 (H) 05/06/2017 1001   BILITOT 0.5 02/28/2014 1450       ASSESSMENT & PLAN:   Essential thrombocythemia ([place]) # 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 Patient is on Hydrea 500 mg/once a day with aspirin 81 mg a day. Platelets today 544/ slightly elevated. New scipt given.   # Slightly elevated  bilirubin 1.5 normal 1.3 normal AST ALT. Intermittent. Monitor for now.   # EGD- [Dr.Ganum; [place]]. On zantac.    #  follow-up in 12 months with labs' 6 months/labs. [pt pref]       Cammie Sickle, MD 05/12/2017 8:47 PM

## 2017-05-15 ENCOUNTER — Other Ambulatory Visit: Payer: Self-pay | Admitting: Internal Medicine

## 2017-05-15 DIAGNOSIS — D473 Essential (hemorrhagic) thrombocythemia: Secondary | ICD-10-CM

## 2017-09-20 ENCOUNTER — Ambulatory Visit: Payer: Self-pay | Admitting: Medical

## 2017-09-20 VITALS — BP 113/75 | HR 81 | Temp 98.6°F | Resp 18 | Wt 171.4 lb

## 2017-09-20 DIAGNOSIS — R05 Cough: Secondary | ICD-10-CM

## 2017-09-20 DIAGNOSIS — J069 Acute upper respiratory infection, unspecified: Secondary | ICD-10-CM

## 2017-09-20 DIAGNOSIS — R059 Cough, unspecified: Secondary | ICD-10-CM

## 2017-09-20 MED ORDER — BENZONATATE 100 MG PO CAPS: 100.0000 mg | ORAL_CAPSULE | Freq: Three times a day (TID) | ORAL | 0 refills | Status: DC | PRN

## 2017-09-20 MED ORDER — AZITHROMYCIN 250 MG PO TABS: ORAL_TABLET | ORAL | 0 refills | Status: DC

## 2017-09-20 NOTE — Patient Instructions (Signed)
Cough, Adult A cough helps to clear your throat and lungs. A cough may last only 2-3 weeks (acute), or it may last longer than 8 weeks (chronic). Many different things can cause a cough. A cough may be a sign of an illness or another medical condition. Follow these instructions at home:  Pay attention to any changes in your cough.  Take medicines only as told by your doctor. ? If you were prescribed an antibiotic medicine, take it as told by your doctor. Do not stop taking it even if you start to feel better. ? Talk with your doctor before you try using a cough medicine.  Drink enough fluid to keep your pee (urine) clear or pale yellow.  If the air is dry, use a cold steam vaporizer or humidifier in your home.  Stay away from things that make you cough at work or at home.  If your cough is worse at night, try using extra pillows to raise your head up higher while you sleep.  Do not smoke, and try not to be around smoke. If you need help quitting, ask your doctor.  Do not have caffeine.  Do not drink alcohol.  Rest as needed. Contact a doctor if:  You have new problems (symptoms).  You cough up yellow fluid (pus).  Your cough does not get better after 2-3 weeks, or your cough gets worse.  Medicine does not help your cough and you are not sleeping well.  You have pain that gets worse or pain that is not helped with medicine.  You have a fever.  You are losing weight and you do not know why.  You have night sweats. Get help right away if:  You cough up blood.  You have trouble breathing.  Your heartbeat is very fast. This information is not intended to replace advice given to you by your health care provider. Make sure you discuss any questions you have with your health care provider. Document Released: 04/15/2011 Document Revised: 01/08/2016 Document Reviewed: 10/09/2014 Elsevier Interactive Patient Education  2018 Elsevier [employer]. Upper Respiratory Infection,  Adult Most upper respiratory infections (URIs) are caused by a virus. A URI affects the nose, throat, and upper air passages. The most common type of URI is often called "the common cold." Follow these instructions at home:  Take medicines only as told by your doctor.  Gargle warm saltwater or take cough drops to comfort your throat as told by your doctor.  Use a warm mist humidifier or inhale steam from a shower to increase air moisture. This may make it easier to breathe.  Drink enough fluid to keep your pee (urine) clear or pale yellow.  Eat soups and other clear broths.  Have a healthy diet.  Rest as needed.  Go back to work when your fever is gone or your doctor says it is okay. ? You may need to stay home longer to avoid giving your URI to others. ? You can also wear a face mask and wash your hands often to prevent spread of the virus.  Use your inhaler more if you have asthma.  Do not use any tobacco products, including cigarettes, chewing tobacco, or electronic cigarettes. If you need help quitting, ask your doctor. Contact a doctor if:  You are getting worse, not better.  Your symptoms are not helped by medicine.  You have chills.  You are getting more short of breath.  You have brown or red mucus.  You have yellow or   brown discharge from your nose.  You have pain in your face, especially when you bend forward.  You have a fever.  You have puffy (swollen) neck glands.  You have pain while swallowing.  You have white areas in the back of your throat. Get help right away if:  You have very bad or constant: ? Headache. ? Ear pain. ? Pain in your forehead, behind your eyes, and over your cheekbones (sinus pain). ? Chest pain.  You have long-lasting (chronic) lung disease and any of the following: ? Wheezing. ? Long-lasting cough. ? Coughing up blood. ? A change in your usual mucus.  You have a stiff neck.  You have changes in  your: ? Vision. ? Hearing. ? Thinking. ? Mood. This information is not intended to replace advice given to you by your health care provider. Make sure you discuss any questions you have with your health care provider. Document Released: 01/19/2008 Document Revised: 04/04/2016 Document Reviewed: 11/07/2013 Elsevier Interactive Patient Education  2018 Elsevier Inc.  

## 2017-09-20 NOTE — Progress Notes (Signed)
   Subjective:    Patient ID: Joshua Cabrera, male    DOB: 24-Dec-1955, 62 y.o.   MRN: [id number]  HPI 62 yo male in non acute distress, comes in today with cough non productive  starting on Friday. Mucinex and Dayquil and Nyquil. History of Pneumonia  3  Times, and Bronchitis a dosen times or so.  Denies fever or chills, some achiness. Runny today that was clear. No body aches.  Review of Systems  Constitutional: Positive for fatigue. Negative for chills and fever.  HENT: Positive for congestion, postnasal drip and sinus pressure ( behind bridge of nose). Negative for sore throat.   Respiratory: Positive for cough. Negative for shortness of breath.   Cardiovascular: Negative for chest pain.  Gastrointestinal: Negative for abdominal pain.  Musculoskeletal: Negative for myalgias.  Neurological: Positive for headaches (this morning top of head pressure). Negative for dizziness and light-headedness.  Hematological: Negative for adenopathy.  Psychiatric/Behavioral: Negative for behavioral problems, self-injury and suicidal ideas. The patient is not nervous/anxious.    2 times soft bowel movement yesterday.    Objective:   Physical Exam  Constitutional: He is oriented to person, place, and time. He appears well-developed and well-nourished.  HENT:  Head: Normocephalic and atraumatic.  Right Ear: Hearing, tympanic membrane, external ear and ear canal normal.  Left Ear: Hearing, external ear and ear canal normal. A middle ear effusion is present.  Nose: Rhinorrhea present.  Mouth/Throat: Uvula is midline, oropharynx is clear and moist and mucous membranes are normal.  Eyes: Conjunctivae and EOM are normal. Pupils are equal, round, and reactive to light.  Neck: Normal range of motion. Neck supple.  Cardiovascular: Normal rate, regular rhythm and normal heart sounds.  Pulmonary/Chest: Effort normal and breath sounds normal.  Lymphadenopathy:    He has no cervical adenopathy.  Neurological: He  is alert and oriented to person, place, and time.  Skin: Skin is warm and dry.  Psychiatric: He has a normal mood and affect. His behavior is normal. Judgment and thought content normal.  Nursing note and vitals reviewed.  Cough noted in room      Assessment & Plan:  Upper Respiratory Infection and cough  OTC Zyrtec or Claritin, Mucinex plain , take as directed. Meds ordered this encounter  Medications  . azithromycin (ZITHROMAX) 250 MG tablet    Sig: Take  2 tablets by mouth day 1 then one tablet by mouth day 2-5.    Dispense:  6 tablet    Refill:  0  . benzonatate (TESSALON PERLES) 100 MG capsule    Sig: Take 1 capsule (100 mg total) by mouth 3 (three) times daily as needed.    Dispense:  30 capsule    Refill:  0  Rest and increase fluids. He says he'll have little time for rest due to spring semester. Return in 3- 5 days if not improving. Patient verbalizes understanding and has no questions at this time.

## 2017-11-03 ENCOUNTER — Inpatient Hospital Stay: Payer: BLUE CROSS/BLUE SHIELD | Attending: Internal Medicine

## 2017-11-03 DIAGNOSIS — D473 Essential (hemorrhagic) thrombocythemia: Secondary | ICD-10-CM | POA: Diagnosis not present

## 2017-11-03 LAB — CBC WITH DIFFERENTIAL/PLATELET
Basophils Absolute: 0.1 10*3/uL (ref 0–0.1)
Basophils Relative: 2 %
EOS ABS: 0.1 10*3/uL (ref 0–0.7)
Eosinophils Relative: 2 %
HCT: 42.2 % (ref 40.0–52.0)
Hemoglobin: 14.4 g/dL (ref 13.0–18.0)
Lymphocytes Relative: 26 %
Lymphs Abs: 1.4 10*3/uL (ref 1.0–3.6)
MCH: 35.3 pg — ABNORMAL HIGH (ref 26.0–34.0)
MCHC: 34.1 g/dL (ref 32.0–36.0)
MCV: 103.4 fL — ABNORMAL HIGH (ref 80.0–100.0)
MONO ABS: 0.6 10*3/uL (ref 0.2–1.0)
Monocytes Relative: 11 %
Neutro Abs: 3.3 10*3/uL (ref 1.4–6.5)
Neutrophils Relative %: 59 %
PLATELETS: 614 10*3/uL — AB (ref 150–440)
RBC: 4.08 MIL/uL — AB (ref 4.40–5.90)
RDW: 13.9 % (ref 11.5–14.5)
WBC: 5.6 10*3/uL (ref 3.8–10.6)

## 2017-11-03 LAB — COMPREHENSIVE METABOLIC PANEL
ALBUMIN: 4 g/dL (ref 3.5–5.0)
ALK PHOS: 70 U/L (ref 38–126)
ALT: 18 U/L (ref 17–63)
AST: 24 U/L (ref 15–41)
Anion gap: 7 (ref 5–15)
BUN: 13 mg/dL (ref 6–20)
CALCIUM: 8.9 mg/dL (ref 8.9–10.3)
CO2: 27 mmol/L (ref 22–32)
Chloride: 101 mmol/L (ref 101–111)
Creatinine, Ser: 0.7 mg/dL (ref 0.61–1.24)
GFR calc Af Amer: >60 mL/min (ref >60)
GLUCOSE: 106 mg/dL — AB (ref 65–99)
POTASSIUM: 3.8 mmol/L (ref 3.5–5.1)
Sodium: 135 mmol/L (ref 135–145)
Total Bilirubin: 0.9 mg/dL (ref 0.3–1.2)
Total Protein: 7.1 g/dL (ref 6.5–8.1)

## 2017-11-07 ENCOUNTER — Telehealth: Payer: Self-pay | Admitting: *Deleted

## 2017-11-07 DIAGNOSIS — D473 Essential (hemorrhagic) thrombocythemia: Secondary | ICD-10-CM

## 2017-11-07 NOTE — Telephone Encounter (Signed)
Results discuss with patient. New dosing of hydrea discussed with patient.  Patient currently taking hydrea 500 mg daily. Pt educated to take hydrea 500 mg daily and take an additional 500 mg hydrea dosing on MWF.  Teach back proces performed with patient. New lab only apt given to patient for 3/21 at 245 pm.

## 2017-11-07 NOTE — Telephone Encounter (Signed)
-----   Message from Cammie Sickle, MD sent at 11/07/2017  8:30 AM EDT ----- Please inform patient that platelets are elevated at 614; confirm if patient is compliant with his Hydrea.  If he is compliant-I would recommend taking Extra Hydrea 500 mg [one pill] M/W/F; in addition to his once a day hydrea.  Recommend repeating CBC in 3 months/please order.   If the patient still has questions-I can talk to him. Thx

## 2018-02-03 ENCOUNTER — Inpatient Hospital Stay: Payer: BLUE CROSS/BLUE SHIELD

## 2018-02-10 ENCOUNTER — Inpatient Hospital Stay: Payer: BLUE CROSS/BLUE SHIELD | Attending: Internal Medicine

## 2018-02-10 DIAGNOSIS — D473 Essential (hemorrhagic) thrombocythemia: Secondary | ICD-10-CM | POA: Insufficient documentation

## 2018-02-10 LAB — CBC WITH DIFFERENTIAL/PLATELET
BASOS PCT: 1 %
Basophils Absolute: 0.1 10*3/uL (ref 0–0.1)
EOS ABS: 0.2 10*3/uL (ref 0–0.7)
Eosinophils Relative: 3 %
HCT: 43.1 % (ref 40.0–52.0)
Hemoglobin: 14.8 g/dL (ref 13.0–18.0)
Lymphocytes Relative: 22 %
Lymphs Abs: 1.4 10*3/uL (ref 1.0–3.6)
MCH: 35.7 pg — ABNORMAL HIGH (ref 26.0–34.0)
MCHC: 34.3 g/dL (ref 32.0–36.0)
MCV: 104.3 fL — ABNORMAL HIGH (ref 80.0–100.0)
Monocytes Absolute: 0.7 10*3/uL (ref 0.2–1.0)
Monocytes Relative: 11 %
Neutro Abs: 4.1 10*3/uL (ref 1.4–6.5)
Neutrophils Relative %: 63 %
PLATELETS: 471 10*3/uL — AB (ref 150–440)
RBC: 4.13 MIL/uL — AB (ref 4.40–5.90)
RDW: 13.4 % (ref 11.5–14.5)
WBC: 6.6 10*3/uL (ref 3.8–10.6)

## 2018-02-10 LAB — COMPREHENSIVE METABOLIC PANEL WITH GFR
ALT: 19 U/L (ref 0–44)
AST: 23 U/L (ref 15–41)
Albumin: 3.9 g/dL (ref 3.5–5.0)
Alkaline Phosphatase: 51 U/L (ref 38–126)
Anion gap: 7 (ref 5–15)
BUN: 16 mg/dL (ref 8–23)
CO2: 25 mmol/L (ref 22–32)
Calcium: 9 mg/dL (ref 8.9–10.3)
Chloride: 104 mmol/L (ref 98–111)
Creatinine, Ser: 0.7 mg/dL (ref 0.61–1.24)
GFR calc Af Amer: >60 mL/min
GFR calc non Af Amer: >60 mL/min
Glucose, Bld: 92 mg/dL (ref 70–99)
Potassium: 4.3 mmol/L (ref 3.5–5.1)
Sodium: 136 mmol/L (ref 135–145)
Total Bilirubin: 0.7 mg/dL (ref 0.3–1.2)
Total Protein: 7.1 g/dL (ref 6.5–8.1)

## 2018-04-02 ENCOUNTER — Emergency Department
Admission: EM | Admit: 2018-04-02 | Discharge: 2018-04-02 | Disposition: A | Discharge status: Home / Self Care | Payer: BLUE CROSS/BLUE SHIELD | Attending: Emergency Medicine | Admitting: Emergency Medicine

## 2018-04-02 ENCOUNTER — Other Ambulatory Visit: Payer: Self-pay

## 2018-04-02 ENCOUNTER — Encounter: Payer: Self-pay | Admitting: Emergency Medicine

## 2018-04-02 DIAGNOSIS — Z7982 Long term (current) use of aspirin: Secondary | ICD-10-CM | POA: Diagnosis not present

## 2018-04-02 DIAGNOSIS — Z79899 Other long term (current) drug therapy: Secondary | ICD-10-CM | POA: Insufficient documentation

## 2018-04-02 DIAGNOSIS — T63441A Toxic effect of venom of bees, accidental (unintentional), initial encounter: Secondary | ICD-10-CM | POA: Diagnosis not present

## 2018-04-02 DIAGNOSIS — T7840XA Allergy, unspecified, initial encounter: Secondary | ICD-10-CM

## 2018-04-02 MED ORDER — FAMOTIDINE IN NACL 20-0.9 MG/50ML-% IV SOLN
20.0000 mg | Freq: Once | INTRAVENOUS | Status: AC
  Administered 2018-04-02: 20 mg via INTRAVENOUS
  Filled 2018-04-02: qty 50

## 2018-04-02 MED ORDER — PREDNISONE 50 MG PO TABS: ORAL_TABLET | ORAL | 0 refills | Status: DC

## 2018-04-02 MED ORDER — EPINEPHRINE 0.3 MG/0.3ML IJ SOAJ: 0.3000 mg | Freq: Once | INTRAMUSCULAR | 0 refills | Status: AC

## 2018-04-02 MED ORDER — METHYLPREDNISOLONE SODIUM SUCC 125 MG IJ SOLR
125.0000 mg | Freq: Once | INTRAMUSCULAR | Status: AC
  Administered 2018-04-02: 125 mg via INTRAVENOUS
  Filled 2018-04-02: qty 2

## 2018-04-02 MED ORDER — DIPHENHYDRAMINE HCL 50 MG/ML IJ SOLN: 50.0000 mg | Freq: Once | INTRAMUSCULAR | Status: DC

## 2018-04-02 NOTE — ED Provider Notes (Addendum)
[facility] Emergency Department Provider Note ____________________________________________   First MD Initiated Contact with Patient 04/02/18 1811     (approximate)  I have reviewed the triage vital signs and the nursing notes.   HISTORY  Chief Complaint Allergic Reaction   HPI Joshua Cabrera is a 62 y.o. male who had a bee sting approximate 2 hours ago.  Started having a rash, cough as well as lip and facial swelling around the eyes.  Took 2 Benadryl at home with only minimal relief and then came to the hospital.  Has not had a reaction like this before.  Says that his voice is normal and he does not feel his throat closing.   Past Medical History:  Diagnosis Date  . Arthritis    osteoarthritis -hips,spine,hands and left wrist.  . Arthritis   . Complication of anesthesia   . GERD (gastroesophageal reflux disease)   . Pneumonia   . PONV (postoperative nausea and vomiting)   . Thrombocytosis ([place])   . Unspecified diseases of blood and blood-forming organs    "essential hyperthrombocytosis"    Patient Active Problem List   Diagnosis Date Noted  . 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 Symptomatic bradycardia 02/21/2014  . Hyponatremia 02/12/2014  . OA (osteoarthritis) of hip 02/11/2014  . Arthralgia 03/02/2012    Past Surgical History:  Procedure Laterality Date  . BACK SURGERY  June 2015  . ROTATOR CUFF REPAIR     x3(lt x2), rt. x1  . 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 Authorizing Provider  aspirin 81 MG tablet Take 81 mg by mouth daily.    [provider]  azithromycin (ZITHROMAX) 250 MG tablet Take  2 tablets by mouth day 1 then one tablet by mouth day 2-5. 09/20/17   Ratcliffe, Heather R, PA-C   benzonatate (TESSALON PERLES) 100 MG capsule Take 1 capsule (100 mg total) by mouth 3 (three) times daily as needed. 09/20/17   Ratcliffe, Heather R, PA-C  hydroxyurea (HYDREA) 500 MG capsule Take 1 capsule (500 mg total) by mouth daily. May take with food to minimize GI side effects. 05/06/17   Cammie Sickle, MD  ibuprofen (ADVIL,MOTRIN) 800 MG tablet Take 800 mg by mouth every 8 (eight) hours as needed.    [provider]  Melatonin 10 MG TABS Take 1 tablet by mouth at bedtime.     [provider]  VIAGRA 100 MG tablet Take 100 mg by mouth as needed.  08/29/15   [provider]    Allergies Vicodin [hydrocodone-acetaminophen] and Penicillins  Family History  Problem Relation Age of Onset  . Cancer Maternal Aunt   . Cancer Paternal Aunt   . Stroke Maternal Grandfather     Social History Social History   Tobacco Use  . Smoking status: Never Smoker  . Smokeless tobacco: Never Used  Substance Use Topics  . Alcohol use: Yes    Alcohol/week: 2.0 standard drinks    Types: 2 Cans of beer per week    Comment: beer/ wine 2glasses  per night  . Drug use: No    Review of Systems  Constitutional: No fever/chills Eyes: No visual changes. ENT: No sore throat. Cardiovascular: Denies chest pain. Respiratory: Cough as above.  Nonproductive. Gastrointestinal:  No abdominal pain.  No nausea, no vomiting.  No diarrhea.  No constipation. Genitourinary: Negative for dysuria. Musculoskeletal: Negative for back pain. Skin: Rash to the trunk, bilateral upper extremities as well as the face. Neurological: Negative for headaches, focal weakness or numbness.   ____________________________________________   PHYSICAL EXAM:  VITAL SIGNS: ED Triage Vitals [04/02/18 1809]  Enc Vitals Group     BP 104/73     Pulse Rate 83     Resp (!) 22     Temp 98.7 F (37.1 C)     Temp Source Oral     SpO2 94 %     Weight 175 lb (79.4 kg)     Height 5\' 10"  (1.778 m)      Head Circumference      Peak Flow      Pain Score 1     Pain Loc      Pain Edu?      Excl. in [place]?     Constitutional: Alert and oriented.  No respiratory distress.  Normal voice.  Controlling secretions. Eyes: Conjunctivae are normal.  Head: Atraumatic. Nose: No congestion/rhinnorhea. Mouth/Throat: Mucous membranes are moist.  No swelling to the tongue nor the uvula or tonsils. Neck: No stridor.   Cardiovascular: Normal rate, regular rhythm. Grossly normal heart sounds.   Respiratory: Normal respiratory effort.  No retractions. Lungs CTAB. Gastrointestinal: Soft and nontender. No distention. No CVA tenderness. Musculoskeletal: No lower extremity tenderness nor edema.  No joint effusions. Neurologic:  Normal speech and language. No gross focal neurologic deficits are appreciated. Skin: Urticaria to the chest and back as well as bilateral upper extremities.  Mild swelling to the periorbital regions as well as the bilateral lips without tongue swelling or any swelling to the structures of the posterior pharynx.  I examined the patient's back over the area where he said that he felt the sting initially and there is no stinger that I can identify still in the skin. Psychiatric: Mood and affect are normal. Speech and behavior are normal.  ____________________________________________   LABS (all labs ordered are listed, but only abnormal results are displayed)  Labs Reviewed - No data to display ____________________________________________  EKG   ____________________________________________  RADIOLOGY   ____________________________________________   PROCEDURES  Procedure(s) performed:   Procedures  Critical Care performed:   ____________________________________________   INITIAL IMPRESSION / ASSESSMENT AND PLAN / ED COURSE  Pertinent labs & imaging results that were available during my care of the patient were reviewed by me and considered in my medical decision making  (see chart for details).  DDX: Anaphylactic reaction, anaphylactoid reaction, urticaria, allergic reaction As part of my medical decision making, I reviewed the following data within the [place] notes from previous outpatient visit.  Plan on 4-hour observation for this patient.  ----------------------------------------- 10:14 PM on 04/02/2018 -----------------------------------------  Rash is almost completely faded.  No longer any swelling around the periorbital regions nor is or any lip swelling.  Patient did not cough at all when I was in the room.  He will be discharged with prednisone as well as an EpiPen.  I will give follow-up with ENT for further allergy testing.  He is understanding the treatment plan and willing to comply. ____________________________________________   FINAL CLINICAL IMPRESSION(S) / ED DIAGNOSES  Allergic reaction.  NEW MEDICATIONS STARTED DURING THIS VISIT:  New Prescriptions   No medications on file     Note:  This document was prepared using Dragon voice recognition  software and may include unintentional dictation errors.     Orbie Pyo, MD 04/02/18 2214    Orbie Pyo, MD 04/02/18 2217

## 2018-04-02 NOTE — ED Triage Notes (Signed)
Pt comes into the ED via POV c/o allergic reaction to a wasp sting to the back.  Patient has swollen lips, increased coughing and clearing of the throat.  Patient has hives present as well.  Patient denies any past known allergy to them.  At this time he still has even and unlabored respirations.

## 2018-04-17 ENCOUNTER — Ambulatory Visit: Payer: Self-pay | Admitting: Adult Health

## 2018-04-17 ENCOUNTER — Encounter: Payer: Self-pay | Admitting: Adult Health

## 2018-04-17 VITALS — BP 119/79 | HR 78 | Temp 98.4°F | Resp 16 | Wt 176.4 lb

## 2018-04-17 DIAGNOSIS — S50861A Insect bite (nonvenomous) of right forearm, initial encounter: Secondary | ICD-10-CM

## 2018-04-17 DIAGNOSIS — W57XXXA Bitten or stung by nonvenomous insect and other nonvenomous arthropods, initial encounter: Principal | ICD-10-CM

## 2018-04-17 DIAGNOSIS — L089 Local infection of the skin and subcutaneous tissue, unspecified: Secondary | ICD-10-CM

## 2018-04-17 MED ORDER — SULFAMETHOXAZOLE-TRIMETHOPRIM 800-160 MG PO TABS: 1.0000 | ORAL_TABLET | Freq: Two times a day (BID) | ORAL | 0 refills | Status: DC

## 2018-04-17 MED ORDER — PREDNISONE 10 MG (21) PO TBPK: ORAL_TABLET | ORAL | 0 refills | Status: DC

## 2018-04-17 NOTE — Progress Notes (Signed)
Patient ID: Joshua Cabrera, male   DOB: 1955-09-11, 62 y.o.   MRN: [id number]   Provider was called by Marliss Coots Day on way home from clinic patients pharmacy was closed due to holiday, and after 5 pm before provider home and could access Internet. Provider found [name] until 6 pm and spoke with patient and he is in agreement for medications to be sent there. Spoke with Jenelle Mages who verbalized scripts were received and would be ready for patient today before 6 pm. Patient notified.  [facility] on [address] was closed for labor day. Will have nursing call to cancel scripts at this store on  04/17/18 and discontinue order for Prednisone dose pack and Bactrim as below since new script was sent to [facility].    Meds ordered this encounter  Medications  . DISCONTD: predniSONE (STERAPRED UNI-PAK 21 TAB) 10 MG (21) TBPK tablet    Sig: PO: Take 6 tablets on day 1:Take 5 tablets day 2:Take 4 tablets day 3: Take 3 tablets day 4:Take 2 tablets day five: 5 Take 1 tablet day 6    Dispense:  21 tablet    Refill:  0  . DISCONTD: sulfamethoxazole-trimethoprim (BACTRIM DS,SEPTRA DS) 800-160 MG tablet    Sig: Take 1 tablet by mouth 2 (two) times daily.    Dispense:  10 tablet    Refill:  0  . predniSONE (STERAPRED UNI-PAK 21 TAB) 10 MG (21) TBPK tablet    Sig: PO: Take 6 tablets on day 1:Take 5 tablets day 2:Take 4 tablets day 3: Take 3 tablets day 4:Take 2 tablets day five: 5 Take 1 tablet day 6    Dispense:  21 tablet    Refill:  0  . sulfamethoxazole-trimethoprim (BACTRIM DS,SEPTRA DS) 800-160 MG tablet    Sig: Take 1 tablet by mouth 2 (two) times daily.    Dispense:  20 tablet    Refill:  0

## 2018-04-17 NOTE — Progress Notes (Signed)
Subjective:     Patient ID: Joshua Cabrera, male   DOB: 07-27-1956, 62 y.o.   MRN: [id number]  HPI   Blood pressure 119/79, pulse 78, temperature 98.4 F (36.9 C), temperature source Tympanic, resp. rate 16, weight 176 lb 6.4 oz (80 kg), SpO2 98 %. Patent is a 63 year old male in no acute distress who comes to clinic inno acute distress who reports he was stung by " something" while on the roof last night cutting a tree limb  - he reports mild swelling on right face , right hand and right lower arm.  Denies any pain.  He took Benadryl 50 mg last night and also he and his wife went to the emergency room and sat outside for an hour and no symptoms worsened so they eft.  He reports this episode was less severe than the episode on 04/02/18. He reports he is talking normal and denies any throat pain or throat swelling. Denies any difficulty breathing.  He is unsure what stung him - he does not feel like it was waasp like his last sting on 8/18.- he was trying to cut a tree branch and feells this time was honey bees because they were much smaller than what had previously stung him/ Though he reports he was not able to get a good look at the insect.   He is unsure what stung him - he does not feel like it was waasp like his last sting on 8/18.- he was trying to cut a tree branch and feells this time was honey bees because they were much smaller than what had previously stung him/ Though he reports he was not able to get a good look at the insect.  04/02/18 he was seen for allergic reaction to bee sting on 04/02/18. He was given a prescription for epi pen and has not gotten it filled due to cost.   He was given Prednisone injection, oral prednisone and Famotidine IV during Emergency room visit on 04/02/18 and instructed to follow up with Schedule an appointment with Derinda Late, MD (Family Medicine) in 1 week (04/09/2018) Schedule an appointment with Margaretha Sheffield, MD (Otolaryngology) in 1 week (04/09/2018). He  reports he has not followed up with these providers for office visit yet but he did call his PCP today and told them he was going to be seen at [facility] and they advised him to also fill the EPI Pen script.   Patient  denies any fever, body aches,chills, rash, chest pain, shortness of breath, nausea, vomiting, or diarrhea.    Review of Systems  Constitutional: Negative.   HENT: Negative.   Eyes: Negative.   Respiratory: Negative.   Cardiovascular: Negative.   Gastrointestinal: Negative.   Endocrine: Negative.   Genitourinary: Negative.   Musculoskeletal: Negative.   Skin: Positive for color change and rash. Negative for pallor and wound.  Allergic/Immunologic:        -- Vicodin (Hydrocodone-Acetaminophen) -- Nausea And Vomiting  -- Penicillins -- Rash   Neurological: Negative.   Hematological: Negative.   Psychiatric/Behavioral: Negative.        Objective:   Physical Exam  Constitutional: He is oriented to person, place, and time. He appears well-developed and well-nourished. No distress.  Patient is alert and oriented and responsive to questions Engages in eye contact with provider. Speaks in full sentences without any pauses without any shortness of breath or distress.   Patient moves on and off of exam table and in room without  difficulty. Gait is normal in hall and in room. Patient is oriented to person place time and situation. Patient answers questions appropriately and engages in conversation.   HENT:  Head: Normocephalic and atraumatic.  Right Ear: External ear normal.  Left Ear: External ear normal.  Nose: Nose normal.  Mouth/Throat: Oropharynx is clear and moist. No oropharyngeal exudate.  Eyes: Pupils are equal, round, and reactive to light. Conjunctivae and EOM are normal. Right eye exhibits no discharge. Left eye exhibits no discharge. No scleral icterus.  Neck: Normal range of motion. Neck supple.  Cardiovascular: Normal rate, regular rhythm, normal heart sounds and  intact distal pulses. Exam reveals no gallop and no friction rub.  No murmur heard. Pulmonary/Chest: Effort normal and breath sounds normal. No stridor. No respiratory distress. He has no wheezes. He has no rales. He exhibits no tenderness.  Abdominal: Soft.  Musculoskeletal: Normal range of motion.  Lymphadenopathy:       Head (right side): No submental, no submandibular, no tonsillar, no preauricular, no posterior auricular and no occipital adenopathy present.       Head (left side): No submental, no submandibular, no tonsillar, no preauricular, no posterior auricular and no occipital adenopathy present.    He has no cervical adenopathy.  Neurological: He is alert and oriented to person, place, and time.  Skin: Skin is warm and dry. Capillary refill takes less than 2 seconds. No rash noted. He is not diaphoretic. There is erythema. No pallor. Nails show no clubbing.     Areas of stings marked on body diagram.  Mild erythema left posterior shoulder at area patient feels was stung. No temperature change.   Right maxillary swelling of face - mild. No surrounding erythema. No temperature change.   Right posterior arm with mild warmth, between mild to moderate swelling, and mild erythema approximately 4 cm x 3 cm. No drainage.   Psychiatric: He has a normal mood and affect. His behavior is normal. Judgment and thought content normal.       Assessment:   Insect bite of right forearm, initial encounter  Skin infection      Plan:        Zantac 75 mg tablets ( 150 mg total - 2 tablets given) [place] [id number] EXP 07/20 [id number] . Patient at that time says he takes ranitidine 75 mg daily at night but he forgot to take his last night. He will not take his daily dosage of Zantac tonight then will continue his normal prescribed dosage.   He is advised Benadryl per package instructions at bedtime and PRN per package instructions for any allergic reaction.  Cool compressed to right arm.  Hydrocortisone cream. Be seen by a medical provider fr any change in skin color, temperature, pr spreading erythema. He reports he has taken Sulfur antibiotics in past without any difficulty.   Discontinue Claritin daily and start Zyrtec daily per package instructions.   Meds ordered this encounter  Medications  . predniSONE (STERAPRED UNI-PAK 21 TAB) 10 MG (21) TBPK tablet    Sig: PO: Take 6 tablets on day 1:Take 5 tablets day 2:Take 4 tablets day 3: Take 3 tablets day 4:Take 2 tablets day five: 5 Take 1 tablet day 6    Dispense:  21 tablet    Refill:  0  . sulfamethoxazole-trimethoprim (BACTRIM DS,SEPTRA DS) 800-160 MG tablet    Sig: Take 1 tablet by mouth 2 (two) times daily.    Dispense:  10 tablet    Refill:  0  Recheck of right arm in three days by medical provider recommended and sooner if worsening or changing.  Schedule an appointment with Derinda Late, MD (Family Medicine) in 1 week (04/09/2018) Schedule an appointment with Margaretha Sheffield, MD (Otolaryngology) in 1 week (04/09/2018) As is overdue per emergency room instructions.  Advised of symptoms of anaphylaxis and education regarding need for Epi-Pen and Benadryl on hand. Advised provider recommends filling Epi Pen today and discussed available discount drug cards to reduce cost.   Advised patient call the office or your primary care doctor for an appointment if no improvement within 72 hours or if any symptoms change or worsen at any time  Advised ER or urgent Care if after hours or on weekend. Call 911 for emergency symptoms at any time.Patinet verbalized understanding of all instructions given/reviewed and treatment plan and has no further questions or concerns at this time.    Patient verbalized understanding of all instructions given and denies any further questions at this time.

## 2018-04-17 NOTE — Patient Instructions (Addendum)
Sulfamethoxazole; Trimethoprim, SMX-TMP tablets What is this medicine? SULFAMETHOXAZOLE; TRIMETHOPRIM or SMX-TMP (suhl fuh meth OK suh zohl; trye METH oh prim) is a combination of a sulfonamide antibiotic and a second antibiotic, trimethoprim. It is used to treat or prevent certain kinds of bacterial infections. It will not work for colds, flu, or other viral infections. This medicine may be used for other purposes; ask your health care provider or pharmacist if you have questions. COMMON BRAND NAME(S): Bacter-Aid DS, Bactrim, Bactrim DS, Septra, Septra DS What should I tell my health care provider before I take this medicine? They need to know if you have any of these conditions: -anemia -asthma -being treated with anticonvulsants -if you frequently drink alcohol containing drinks -kidney disease -liver disease -low level of folic acid or [id number] dehydrogenase -poor nutrition or malabsorption -porphyria -severe allergies -thyroid disorder -an unusual or allergic reaction to sulfamethoxazole, trimethoprim, sulfa drugs, other medicines, foods, dyes, or preservatives -pregnant or trying to get pregnant -breast-feeding How should I use this medicine? Take this medicine by mouth with a full glass of water. Follow the directions on the prescription label. Take your medicine at regular intervals. Do not take it more often than directed. Do not skip doses or stop your medicine early. Talk to your pediatrician regarding the use of this medicine in children. Special care may be needed. This medicine has been used in children as young as 34 months of age. Overdosage: If you think you have taken too much of this medicine contact a poison control center or emergency room at once. NOTE: This medicine is only for you. Do not share this medicine with others. What if I miss a dose? If you miss a dose, take it as soon as you can. If it is almost time for your next dose, take only that dose. Do  not take double or extra doses. What may interact with this medicine? Do not take this medicine with any of the following medications: -aminobenzoate potassium -dofetilide -metronidazole This medicine may also interact with the following medications: -ACE inhibitors like benazepril, enalapril, lisinopril, and ramipril -birth control pills -cyclosporine -digoxin -diuretics -indomethacin -medicines for diabetes -methenamine -methotrexate -phenytoin -potassium supplements -pyrimethamine -sulfinpyrazone -tricyclic antidepressants -warfarin This list may not describe all possible interactions. Give your health care provider a list of all the medicines, herbs, non-prescription drugs, or dietary supplements you use. Also tell them if you smoke, drink alcohol, or use illegal drugs. Some items may interact with your medicine. What should I watch for while using this medicine? Tell your doctor or health care professional if your symptoms do not improve. Drink several glasses of water a day to reduce the risk of kidney problems. Do not treat diarrhea with over the counter products. Contact your doctor if you have diarrhea that lasts more than 2 days or if it is severe and watery. This medicine can make you more sensitive to the sun. Keep out of the sun. If you cannot avoid being in the sun, wear protective clothing and use a sunscreen. Do not use sun lamps or tanning beds/booths. What side effects may I notice from receiving this medicine? Side effects that you should report to your doctor or health care professional as soon as possible: -allergic reactions like skin rash or hives, swelling of the face, lips, or tongue -breathing problems -fever or chills, sore throat -irregular heartbeat, chest pain -joint or muscle pain -pain or difficulty passing urine -red pinpoint spots on skin -redness, blistering, peeling or loosening of  the skin, including inside the mouth -unusual bleeding or  bruising -unusually weak or tired -yellowing of the eyes or skin Side effects that usually do not require medical attention (report to your doctor or health care professional if they continue or are bothersome): -diarrhea -dizziness -headache -loss of appetite -nausea, vomiting -nervousness This list may not describe all possible side effects. Call your doctor for medical advice about side effects. You may report side effects to FDA at 1-800-FDA-1088. Where should I keep my medicine? Keep out of the reach of children. Store at room temperature between 20 to 25 degrees C (68 to 77 degrees F). Protect from light. Throw away any unused medicine after the expiration date. NOTE: This sheet is a summary. It may not cover all possible information. If you have questions about this medicine, talk to your doctor, pharmacist, or health care provider.  2018 Elsevier/Gold Standard (2013-03-09 14:38:26) Epinephrine Injection What is epinephrine? Epinephrine is a medicine that is given as a shot (injection) to temporarily treat a life-threatening allergic reaction. It may also be used to treat severe asthma attacks, other lung problems, and other emergency conditions. Epinephrine works by relaxing the muscles in the airways and tightening the blood vessels. Epinephrine comes in many forms, including what is commonly called an auto-injector "pen" (pre-filled automatic epinephrine injection device). You may hear other names that mean the same thing, including epinephrine injection, epinephrine auto-injector pen, epinephrine pen, and automatic injection device. Why do I need epinephrine? You need epinephrine if you experience a severe asthma attack or a life-threatening allergic reaction (anaphylaxis). This injection can be lifesaving. You should always carry an auto-injector pen with you if you are at risk for anaphylaxis. When should I use my auto-injector pen? Use your auto-injector pen as soon as you think  you are experiencing anaphylaxis or a severe asthmatic attack, as told by your health care provider. Anaphylaxis is very dangerous if it is not treated right away. Signs and symptoms of anaphylaxis may include:  Nasal congestion.  Tingling in the mouth.  An itchy, red rash.  Swelling of the eyes, lips, face, or tongue.  Swelling of the back of the mouth and the throat.  Wheezing.  A hoarse voice.  Itchy, red, swollen areas of skin (hives).  Dizziness or light-headedness.  Fainting.  Anxiety or confusion.  Abdominal pain.  Difficulty breathing, speaking, or swallowing.  Chest tightness.  Fast or irregular heartbeats (palpitations).  Vomiting.  Diarrhea.  These symptoms may represent a serious problem that is an emergency. Do not wait to see if the symptoms will go away. Use your auto-injector pen as you have been instructed, and get medical help right away. Call your local emergency services (911 in the [place] Do not drive yourself to the hospital. How do I use an auto-injector pen?  Use epinephrine exactly as told by your health care provider. Do not inject it more often or in greater or smaller doses than your health care provider prescribed. Most auto-injector pens contain one dose of epinephrine. Some contain two doses.  You may use your auto-injector pen to give an injection under your skin or into your muscle on the outer side of your thigh. Do not give yourself an injection into your buttocks or any other part of your body. ? In an emergency, you can use your auto-injector pen through your clothing. ? After you inject a dose of epinephrine, some liquid may remain in your auto-injector pen. This is normal.  If you need  to give yourself a second dose of epinephrine, give the second injection in another location on your outer thigh. Do not give two injections in exactly the same place on your body. This can lead to tissue damage.  Talk with your pharmacist or health  care provider if you have questions about how to inject epinephrine correctly. When should I seek immediate medical care? Seek emergency medical treatment immediately after you inject epinephrine. You may need additional medical care, and you may be monitored for side effects of epinephrine, such as:  Difficulty breathing.  Fast or irregular heartbeat.  Nausea or vomiting.  Sweating.  Dizziness.  Nervousness or anxiety.  Weakness.  Pale skin.  Headache.  Uncontrollable shaking.  This information is not intended to replace advice given to you by your health care provider. Make sure you discuss any questions you have with your health care provider. Document Released: 07/30/2000 Document Revised: 07/16/2016 Document Reviewed: 02/19/2015 Elsevier Interactive Patient Education  2017 Winter. Diphenhydramine capsules or tablets What is this medicine? DIPHENHYDRAMINE (dye fen HYE dra meen) is an antihistamine. It is used to treat the symptoms of an allergic reaction. It is also used to treat Parkinson's disease. This medicine is also used to prevent and to treat motion sickness and as a nighttime sleep aid. This medicine may be used for other purposes; ask your health care provider or pharmacist if you have questions. COMMON BRAND NAME(S): Alka-Seltzer Plus Allergy, Aller-G-Time, Banophen, Benadryl Allergy, Benadryl Allergy Dye Free, Benadryl Allergy Kapgel, Benadryl Allergy Ultratab, Diphedryl, Diphenhist, Genahist, PHARBEDRYL, Q-Dryl, Gretta Began, Valu-Dryl, Vicks ZzzQuil Nightime Sleep-Aid What should I tell my health care provider before I take this medicine? They need to know if you have any of these conditions: -asthma or lung disease -glaucoma -high blood pressure or heart disease -liver disease -pain or difficulty passing urine -prostate trouble -ulcers or other stomach problems -an unusual or allergic reaction to diphenhydramine, other medicines foods, dyes, or  preservatives such as sulfites -pregnant or trying to get pregnant -breast-feeding How should I use this medicine? Take this medicine by mouth with a full glass of water. Follow the directions on the prescription label. Take your doses at regular intervals. Do not take your medicine more often than directed. To prevent motion sickness start taking this medicine 30 to 60 minutes before you leave. Talk to your pediatrician regarding the use of this medicine in children. Special care may be needed. Patients over 106 years old may have a stronger reaction and need a smaller dose. Overdosage: If you think you have taken too much of this medicine contact a poison control center or emergency room at once. NOTE: This medicine is only for you. Do not share this medicine with others. What if I miss a dose? If you miss a dose, take it as soon as you can. If it is almost time for your next dose, take only that dose. Do not take double or extra doses. What may interact with this medicine? Do not take this medicine with any of the following medications: -MAOIs like Carbex, Eldepryl, Marplan, Nardil, and Parnate This medicine may also interact with the following medications: -alcohol -barbiturates, like phenobarbital -medicines for bladder spasm like oxybutynin, tolterodine -medicines for blood pressure -medicines for depression, anxiety, or psychotic disturbances -medicines for movement abnormalities or Parkinson's disease -medicines for sleep -other medicines for cold, cough or allergy -some medicines for the stomach like chlordiazepoxide, dicyclomine This list may not describe all possible interactions. Give your health care provider  a list of all the medicines, herbs, non-prescription drugs, or dietary supplements you use. Also tell them if you smoke, drink alcohol, or use illegal drugs. Some items may interact with your medicine. What should I watch for while using this medicine? Visit your doctor or  health care professional for regular check ups. Tell your doctor if your symptoms do not improve or if they get worse. Your mouth may get dry. Chewing sugarless gum or sucking hard candy, and drinking plenty of water may help. Contact your doctor if the problem does not go away or is severe. This medicine may cause dry eyes and blurred vision. If you wear contact lenses you may feel some discomfort. Lubricating drops may help. See your eye doctor if the problem does not go away or is severe. You may get drowsy or dizzy. Do not drive, use machinery, or do anything that needs mental alertness until you know how this medicine affects you. Do not stand or sit up quickly, especially if you are an older patient. This reduces the risk of dizzy or fainting spells. Alcohol may interfere with the effect of this medicine. Avoid alcoholic drinks. What side effects may I notice from receiving this medicine? Side effects that you should report to your doctor or health care professional as soon as possible: -allergic reactions like skin rash, itching or hives, swelling of the face, lips, or tongue -changes in vision -confused, agitated, nervous -irregular or fast heartbeat -tremor -trouble passing urine -unusual bleeding or bruising -unusually weak or tired Side effects that usually do not require medical attention (report to your doctor or health care professional if they continue or are bothersome): -constipation, diarrhea -drowsy -headache -loss of appetite -stomach upset, vomiting -thick mucous This list may not describe all possible side effects. Call your doctor for medical advice about side effects. You may report side effects to FDA at 1-800-FDA-1088. Where should I keep my medicine? Keep out of the reach of children. Store at room temperature between 15 and 30 degrees C (59 and 86 degrees F). Keep container closed tightly. Throw away any unused medicine after the expiration date. NOTE: This sheet  is a summary. It may not cover all possible information. If you have questions about this medicine, talk to your doctor, pharmacist, or health care provider.  2018 Elsevier/Gold Standard (2007-11-20 17:06:22) Ranitidine tablets or capsules What is this medicine? RANITIDINE (ra NYE te deen) is a type of antihistamine that blocks the release of stomach acid. It is used to treat stomach or intestinal ulcers. It can relieve ulcer pain and discomfort, and the heartburn from acid reflux. This medicine may be used for other purposes; ask your health care provider or pharmacist if you have questions. COMMON BRAND NAME(S): Acid Reducer, Ranitidine, Taladine, Wal-Zan, Zantac, Zantac 150, Zantac 75 What should I tell my health care provider before I take this medicine? They need to know if you have any of these conditions: -kidney disease -liver disease -porphyria -an unusual or allergic reaction to ranitidine, other medicines, foods, dyes, or preservatives -pregnant or trying to get pregnant -breast-feeding How should I use this medicine? Take this medicine by mouth with a glass of water. Follow the directions on the prescription label. If you only take this medicine once a day, take it at bedtime. Take your medicine at regular intervals. Do not take your medicine more often than directed. Do not stop taking except on your doctor's advice. Talk to your pediatrician regarding the use of this medicine in  children. Special care may be needed. Overdosage: If you think you have taken too much of this medicine contact a poison control center or emergency room at once. NOTE: This medicine is only for you. Do not share this medicine with others. What if I miss a dose? If you miss a dose, take it as soon as you can. If it is almost time for your next dose, take only that dose. Do not take double or extra doses. What may interact with this  medicine? -atazanavir -delavirdine -gefitinib -glipizide -ketoconazole -midazolam -procainamide -propantheline -triazolam -warfarin This list may not describe all possible interactions. Give your health care provider a list of all the medicines, herbs, non-prescription drugs, or dietary supplements you use. Also tell them if you smoke, drink alcohol, or use illegal drugs. Some items may interact with your medicine. What should I watch for while using this medicine? Tell your doctor or health care professional if your condition does not start to get better or gets worse. You may need to take this medicine for several days as prescribed before your symptoms get better. Finish the full course of tablets prescribed, even if you feel better. Do not smoke cigarettes or drink alcohol. These increase irritation in your stomach and can lengthen the time it will take for ulcers to heal. Cigarettes and alcohol can also make acid reflux or heartburn worse. If you get black, tarry stools or vomit up what looks like coffee grounds, call your doctor or health care professional at once. You may have a bleeding ulcer. What side effects may I notice from receiving this medicine? Side effects that you should report to your doctor or health care professional as soon as possible: -agitation, nervousness, depression, hallucinations -allergic reactions like skin rash, itching or hives, swelling of the face, lips, or tongue -breast enlargement in both males and females -breathing problems -redness, blistering, peeling or loosening of the skin, including inside the mouth -unusual bleeding or bruising -unusually weak or tired -vomiting -yellowing of the skin or eyes Side effects that usually do not require medical attention (report to your doctor or health care professional if they continue or are bothersome): -constipation or diarrhea -dizziness -headache -nausea This list may not describe all possible side  effects. Call your doctor for medical advice about side effects. You may report side effects to FDA at 1-800-FDA-1088. Where should I keep my medicine? Keep out of the reach of children. Store at room temperature between 15 and 30 degrees C (59 and 86 degrees F). Protect from light and moisture. Keep container tightly closed. Throw away any unused medicine after the expiration date. NOTE: This sheet is a summary. It may not cover all possible information. If you have questions about this medicine, talk to your doctor, pharmacist, or health care provider.  2018 Elsevier/Gold Standard (2012-11-22 14:50:34) Prednisone delayed-release tablets What is this medicine? PREDNISONE (PRED ni sone) is a corticosteroid. It is commonly used to treat inflammation of the skin, joints, lungs, and other organs. Common conditions treated include asthma, allergies, and arthritis. It is also used for other conditions, such as blood disorders and diseases of the adrenal glands. This medicine may be used for other purposes; ask your health care provider or pharmacist if you have questions. COMMON BRAND NAME(S): RAYOS What should I tell my health care provider before I take this medicine? They need to know if you have any of these conditions: -Cushing's syndrome -diabetes -glaucoma -heart disease -high blood pressure -infection (especially a virus infection  such as chickenpox, cold sores, or herpes) -kidney disease -liver disease -mental illness -myasthenia gravis -osteoporosis -seizures -stomach or intestine problems -thyroid disease -an unusual or allergic reaction to lactose, prednisone, other medicines, foods, dyes, or preservatives -pregnant or trying to get pregnant -breast-feeding How should I use this medicine? Take this medicine by mouth with a glass of water. Follow the directions on the prescription label. Take this medicine with food. Do not cut, crush or chew this medicine. Do not suddenly stop  taking your medicine because you may develop a severe reaction. If your doctor wants you to stop the medicine, the dose may be slowly lowered over time to avoid any side effects. Talk to your pediatrician regarding the use of this medicine in children. Special care may be needed. Overdosage: If you think you have taken too much of this medicine contact a poison control center or emergency room at once. NOTE: This medicine is only for you. Do not share this medicine with others. What if I miss a dose? If you miss a dose, take it as soon as you can. If it is almost time for your next dose, talk to your doctor or health care professional. You may need to miss a dose or take an extra dose. Do not take double or extra doses without advice. What may interact with this medicine? Do not take this medicine with any of the following medications: -metyrapone -mifepristone This medicine may also interact with the following medications: -aminoglutethimide -amphotericin B -aspirin and aspirin-like medicines -barbiturates -certain medicines for diabetes, like glipizide or glyburide -cholestyramine -cholinesterase inhibitors -cyclosporine -digoxin -diuretics -ephedrine -male hormones, like estrogens and birth control pills -isoniazid -ketoconazole -NSAIDS, medicines for pain and inflammation, like ibuprofen or naproxen -phenytoin -rifampin -toxoids -vaccines -warfarin This list may not describe all possible interactions. Give your health care provider a list of all the medicines, herbs, non-prescription drugs, or dietary supplements you use. Also tell them if you smoke, drink alcohol, or use illegal drugs. Some items may interact with your medicine. What should I watch for while using this medicine? Visit your doctor or health care professional for regular checks on your progress. If you are taking this medicine over a prolonged period, carry an identification card with your name and address, the  type and dose of your medicine, and your doctor's name and address. This medicine may increase your risk of getting an infection. Tell your doctor or health care professional if you are around anyone with measles or chickenpox, or if you develop sores or blisters that do not heal properly. If you are going to have surgery, tell your doctor or health care professional that you have taken this medicine within the last twelve months. Ask your doctor or health care professional about your diet. You may need to lower the amount of salt you eat. This medicine may affect blood sugar levels. If you have diabetes, check with your doctor or health care professional before you change your diet or the dose of your diabetic medicine. What side effects may I notice from receiving this medicine? Side effects that you should report to your doctor or health care professional as soon as possible: -allergic reactions like skin rash, itching or hives, swelling of the face, lips, or tongue -changes in emotions or moods -changes in vision -depressed mood -eye pain -fever or chills, cough, sore throat, pain or difficulty passing urine -increased thirst -swelling of ankles, feet Side effects that usually do not require medical attention (report to  your doctor or health care professional if they continue or are bothersome): -confusion, excitement, restlessness -headache -nausea, vomiting -skin problems, acne, thin and shiny skin -trouble sleeping -weight gain This list may not describe all possible side effects. Call your doctor for medical advice about side effects. You may report side effects to FDA at 1-800-FDA-1088. Where should I keep my medicine? Keep out of the reach of children. Store at room temperature between 15 and 30 degrees C (59 and 86 degrees F). Protect from light and moisture. Keep container tightly closed. Throw away any unused medicine after the expiration date. NOTE: This sheet is a summary. It  may not cover all possible information. If you have questions about this medicine, talk to your doctor, pharmacist, or health care provider.  2018 Elsevier/Gold Standard (2015-09-04 13:41:35) Bee, Wasp, or [employer], Adult Bees, wasps, and hornets are part of a family of insects that can sting people. These stings can cause pain and inflammation, but they are usually not serious. However, some people may have an allergic reaction to a sting. This can cause the symptoms to be more severe. What increases the risk? You may be at a greater risk of getting stung if you:  Provoke a stinging insect by swatting or disturbing it.  Wear strong-smelling soaps, deodorants, or body sprays.  Spend time outdoors near gardens with flowers or fruit trees or in clothes that expose skin.  Eat or drink outside.  What are the signs or symptoms? Common symptoms of this condition include:  A red lump in the skin that sometimes has a tiny hole in the center. In some cases, a stinger may be in the center of the wound.  Pain and itching at the sting site.  Redness and swelling around the sting site. If you have an allergic reaction (localized allergic reaction), the swelling and redness may spread out from the sting site. In some cases, this reaction can continue to develop over the next 24-48 hours.  In rare cases, a person may have a severe allergic reaction (anaphylactic reaction) to a sting. Symptoms of an anaphylactic reaction may include:  Wheezing or difficulty breathing.  Raised, itchy, red patches on the skin (hives).  Nausea or vomiting.  Abdominal cramping.  Diarrhea.  Tightness in the chest or chest pain.  Dizziness or fainting.  Redness of the face (flushing).  Hoarse voice.  Swollen tongue, lips, or face.  How is this diagnosed? This condition is usually diagnosed based on your symptoms and medical history as well as a physical exam. You may have an allergy test to determine if  you are allergic to the substance that the insect injected during the sting (venom). How is this treated? If you were stung by a bee, the stinger and a small sac of venom may be in the wound. It is important to remove the stinger as soon as possible. You can do this by brushing across the wound with gauze, a fingernail, or a flat card such as a credit card. Removing the stinger can help reduce the severity of your body's reaction to the sting. Most stings can be treated with:  Icing to reduce swelling in the area.  Medicines (antihistamines) to treat itching or an allergic reaction.  Medicines to help reduce pain. These may be medicines that you take by mouth, or medicated creams or lotions that you apply to your skin.  Pay close attention to your symptoms after you have been stung. If possible, have someone stay  with you to make sure you do not have an allergic reaction. If you have any signs of an allergic reaction, call your health care provider. If you have ever had a severe allergic reaction, your health care provider may give you an inhaler or injectable medicine (epinephrine auto-injector) to use if necessary. Follow these instructions at home:  Wash the sting site 2-3 times each day with soap and water as told by your health care provider.  Apply or take over-the-counter and prescription medicines only as told by your health care provider.  If directed, apply ice to the sting area. ? Put ice in a plastic bag. ? Place a towel between your skin and the bag. ? Leave the ice on for 20 minutes, 2-3 times a day.  Do not scratch the sting area.  If you had a severe allergic reaction to a sting, you may need: ? To wear a medical bracelet or necklace that lists the allergy. ? To learn when and how to use an anaphylaxis kit or epinephrine injection. Your family members and coworkers may also need to learn this. ? To carry an anaphylaxis kit or epinephrine injection with you at all  times. How is this prevented?  Avoid swatting at stinging insects and disturbing insect nests.  Do not use fragrant soaps or lotions.  Wear shoes, pants, and long sleeves when spending time outdoors, especially in grassy areas where stinging insects are common.  Keep outdoor areas free from nests or hives.  Keep food and drink containers covered when eating outdoors.  Avoid working or sitting near [employer], if possible.  Wear gloves if you are gardening or working outdoors.  If an attack by a stinging insect or a swarm seems likely in the moment, move away from the area or find a barrier between you and the insect(s), such as a door. Contact a health care provider if:  Your symptoms do not get better in 2-3 days.  You have redness, swelling, or pain that spreads beyond the area of the sting.  You have a fever. Get help right away if: You have symptoms of a severe allergic reaction. These include:  Wheezing or difficulty breathing.  Tightness in the chest or chest pain.  Light-headedness or fainting.  Itchy, raised, red patches on the skin.  Nausea or vomiting.  Abdominal cramping.  Diarrhea.  A swollen tongue or lips, or trouble swallowing.  Dizziness or fainting.  Summary  Stings from bees, wasps, and hornets can cause pain and inflammation, but they are usually not serious. However, some people may have an allergic reaction to a sting. This can cause the symptoms to be more severe.  Pay close attention to your symptoms after you have been stung. If possible, have someone stay with you to make sure you do not have an allergic reaction.  Call your health care provider if you have any signs of an allergic reaction. This information is not intended to replace advice given to you by your health care provider. Make sure you discuss any questions you have with your health care provider. Document Released: 08/02/2005 Document Revised: 10/07/2016 Document  Reviewed: 10/07/2016 Elsevier Interactive Patient Education  2018 [employer].   Cellulitis, Adult Cellulitis is a skin infection. The infected area is usually red and sore. This condition occurs most often in the arms and lower legs. It is very important to get treated for this condition. Follow these instructions at home:  Take over-the-counter and prescription medicines only as  told by your doctor.  If you were prescribed an antibiotic medicine, take it as told by your doctor. Do not stop taking the antibiotic even if you start to feel better.  Drink enough fluid to keep your pee (urine) clear or pale yellow.  Do not touch or rub the infected area.  Raise (elevate) the infected area above the level of your heart while you are sitting or lying down.  Place warm or cold wet cloths (warm or cold compresses) on the infected area. Do this as told by your doctor.  Keep all follow-up visits as told by your doctor. This is important. These visits let your doctor make sure your infection is not getting worse. Contact a doctor if:  You have a fever.  Your symptoms do not get better after 1-2 days of treatment.  Your bone or joint under the infected area starts to hurt after the skin has healed.  Your infection comes back. This can happen in the same area or another area.  You have a swollen bump in the infected area.  You have new symptoms.  You feel ill and also have muscle aches and pains. Get help right away if:  Your symptoms get worse.  You feel very sleepy.  You throw up (vomit) or have watery poop (diarrhea) for a long time.  There are red streaks coming from the infected area.  Your red area gets larger.  Your red area turns darker. This information is not intended to replace advice given to you by your health care provider. Make sure you discuss any questions you have with your health care provider. Document Released: 01/19/2008 Document Revised: 01/08/2016  Document Reviewed: 06/11/2015 Elsevier Interactive Patient Education  2018 [employer].

## 2018-04-17 NOTE — Addendum Note (Signed)
Addended by: Doreen Beam on: 04/17/2018 05:20 PM   Modules accepted: Orders

## 2018-04-18 NOTE — Progress Notes (Signed)
Called the [facility] on corner of [address] and left detailed message advising them to cancel Bactrim and Prednisone Rx for pt that was sent in yest 04/17/18, as pt has already picked them up at [facility]. Phone number to clinic as given and advised to call back with any questions.

## 2018-04-24 ENCOUNTER — Other Ambulatory Visit: Payer: Self-pay | Admitting: *Deleted

## 2018-04-24 DIAGNOSIS — D473 Essential (hemorrhagic) thrombocythemia: Secondary | ICD-10-CM

## 2018-04-24 MED ORDER — HYDROXYUREA 500 MG PO CAPS: 500.0000 mg | ORAL_CAPSULE | Freq: Every day | ORAL | 3 refills | Status: DC

## 2018-04-24 NOTE — Telephone Encounter (Addendum)
Patient has an appointment 05/05/18

## 2018-04-26 ENCOUNTER — Other Ambulatory Visit: Payer: Self-pay | Admitting: *Deleted

## 2018-04-26 DIAGNOSIS — D473 Essential (hemorrhagic) thrombocythemia: Secondary | ICD-10-CM

## 2018-04-26 MED ORDER — HYDROXYUREA 500 MG PO CAPS: 500.0000 mg | ORAL_CAPSULE | Freq: Every day | ORAL | 3 refills | Status: DC

## 2018-04-26 NOTE — Telephone Encounter (Signed)
Patient called stating that Pharmacy has not received his prescription. I confirmed that it was received to [place] [facility] and he stated that is not the correct pharmacy, He uses [facility]. Prescription resubmitted to [facility] and I called [facility] to cancel prescription there.

## 2018-05-05 ENCOUNTER — Inpatient Hospital Stay: Payer: BLUE CROSS/BLUE SHIELD

## 2018-05-05 ENCOUNTER — Inpatient Hospital Stay: Payer: BLUE CROSS/BLUE SHIELD | Attending: Internal Medicine | Admitting: Internal Medicine

## 2018-05-05 ENCOUNTER — Other Ambulatory Visit: Payer: Self-pay

## 2018-05-05 VITALS — BP 108/69 | HR 74 | Temp 97.0°F | Resp 18 | Wt 175.4 lb

## 2018-05-05 DIAGNOSIS — D473 Essential (hemorrhagic) thrombocythemia: Secondary | ICD-10-CM

## 2018-05-05 DIAGNOSIS — Z79899 Other long term (current) drug therapy: Secondary | ICD-10-CM | POA: Insufficient documentation

## 2018-05-05 DIAGNOSIS — Z7982 Long term (current) use of aspirin: Secondary | ICD-10-CM | POA: Insufficient documentation

## 2018-05-05 LAB — CBC WITH DIFFERENTIAL/PLATELET
BASOS ABS: 0.1 10*3/uL (ref 0–0.1)
Basophils Relative: 1 %
EOS ABS: 0.2 10*3/uL (ref 0–0.7)
Eosinophils Relative: 3 %
HCT: 42.6 % (ref 40.0–52.0)
HEMOGLOBIN: 14.6 g/dL (ref 13.0–18.0)
Lymphocytes Relative: 24 %
Lymphs Abs: 1.6 10*3/uL (ref 1.0–3.6)
MCH: 36.4 pg — ABNORMAL HIGH (ref 26.0–34.0)
MCHC: 34.2 g/dL (ref 32.0–36.0)
MCV: 106.2 fL — ABNORMAL HIGH (ref 80.0–100.0)
MONOS PCT: 12 %
Monocytes Absolute: 0.8 10*3/uL (ref 0.2–1.0)
NEUTROS PCT: 60 %
Neutro Abs: 3.9 10*3/uL (ref 1.4–6.5)
Platelets: 538 10*3/uL — ABNORMAL HIGH (ref 150–440)
RBC: 4.01 MIL/uL — ABNORMAL LOW (ref 4.40–5.90)
RDW: 13.7 % (ref 11.5–14.5)
WBC: 6.6 10*3/uL (ref 3.8–10.6)

## 2018-05-05 NOTE — Assessment & Plan Note (Addendum)
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  # Continue Hydrea 500 mg/once a day; add M/W/F-  with aspirin 81 mg a day. Platelets today 538 slightly elevated.  #  follow-up in 12 months with labs; 6 months/labs. [pt pref]

## 2018-05-05 NOTE — Progress Notes (Signed)
[facility] OFFICE PROGRESS NOTE  Patient Care Team: Derinda Late, MD as PCP - General (Family Medicine) Hulan Fess, MD (Family Medicine)   SUMMARY OF ONCOLOGIC HISTORY:  Oncology History   # 2011- ESSENTIAL THROMBOCYTOSIS [Jak-2 pos; incidental]  Summer 2015- Start hydrea;Asprin 81 mg/d; NOV 2016- START hydrea 500/day ]  # # EGD- [Dr.Ganum; [place]]. On zantac.    # Left hip replacement ------------------------------------------------   DIAGNOSIS: [ ]   STAGE:         ;GOALS:  CURRENT/MOST RECENT THERAPY [ ]        Essential thrombocythemia ([place])     INTERVAL HISTORY:  A very pleasant 62 year-old male patient with above history of essential thrombocytosis currently on low-dose Hydrea is here for follow-up.  Approximately 6 months ago-recommended to increase the dose of Hydrea to extra 1 pill Monday Wednesday Friday.  Patient recently missed the new schedule.  Denies any strokes.  Denies any blood clots.  No nausea no vomiting pain no diarrhea.  No weight loss.   Review of Systems  Constitutional: Negative for chills, diaphoresis, fever, malaise/fatigue and weight loss.  HENT: Negative for nosebleeds and sore throat.   Eyes: Negative for double vision.  Respiratory: Negative for cough, hemoptysis, sputum production, shortness of breath and wheezing.   Cardiovascular: Negative for chest pain, palpitations, orthopnea and leg swelling.  Gastrointestinal: Negative for abdominal pain, blood in stool, constipation, diarrhea, heartburn, melena, nausea and vomiting.  Genitourinary: Negative for dysuria, frequency and urgency.  Musculoskeletal: Negative for back pain and joint pain.  Skin: Negative.  Negative for itching and rash.  Neurological: Negative for dizziness, tingling, focal weakness, weakness and headaches.  Endo/Heme/Allergies: Does not bruise/bleed easily.  Psychiatric/Behavioral: Negative for depression. The patient is not nervous/anxious and  does not have insomnia.       PAST MEDICAL HISTORY :  Past Medical History:  Diagnosis Date  . Arthritis    osteoarthritis -hips,spine,hands and left wrist.  . Arthritis   . Complication of anesthesia   . GERD (gastroesophageal reflux disease)   . Pneumonia   . PONV (postoperative nausea and vomiting)   . Thrombocytosis ([place])   . Unspecified diseases of blood and blood-forming organs    "essential hyperthrombocytosis"    PAST SURGICAL HISTORY :   Past Surgical History:  Procedure Laterality Date  . BACK SURGERY  June 2015  . ROTATOR CUFF REPAIR     x3(lt x2), rt. x1  . TOTAL HIP ARTHROPLASTY Left 02/11/2014   Procedure: LEFT TOTAL HIP ARTHROPLASTY ANTERIOR APPROACH;  Surgeon: Gearlean Alf, MD;  Location: [facility];  Service: Orthopedics;  Laterality: Left;    FAMILY HISTORY :   Family History  Problem Relation Age of Onset  . Cancer Maternal Aunt   . Cancer Paternal Aunt   . Stroke Maternal Grandfather     SOCIAL HISTORY:   Social History   Tobacco Use  . Smoking status: Never Smoker  . Smokeless tobacco: Never Used  Substance Use Topics  . Alcohol use: Yes    Alcohol/week: 2.0 standard drinks    Types: 2 Cans of beer per week    Comment: beer/ wine 2glasses  per night  . Drug use: No    ALLERGIES:  is allergic to vicodin [hydrocodone-acetaminophen] and penicillins.  MEDICATIONS:  Current Outpatient Medications  Medication Sig Dispense Refill  . aspirin 81 MG tablet Take 81 mg by mouth daily.    . hydroxyurea (HYDREA) 500 MG capsule Take 1 capsule (500  mg total) by mouth daily. May take with food to minimize GI side effects. 90 capsule 3  . Melatonin 10 MG TABS Take 1 tablet by mouth at bedtime.     Marland Kitchen VIAGRA 100 MG tablet Take 100 mg by mouth as needed.   0  . EPINEPHrine 0.3 mg/0.3 mL IJ SOAJ injection INJECT INTRAMUSCULARLY AS DIRECTED  0  . ibuprofen (ADVIL,MOTRIN) 800 MG tablet Take 800 mg by mouth every 8 (eight) hours as needed.    . loratadine  (CLARITIN) 10 MG tablet Take 10 mg by mouth daily.     No current facility-administered medications for this visit.     PHYSICAL EXAMINATION: ECOG PERFORMANCE STATUS: 0 - Asymptomatic  BP 108/69 (BP Location: Left Arm, Patient Position: Sitting)   Pulse 74   Temp (!) 97 F (36.1 C) (Tympanic)   Resp 18   Wt 175 lb 6.4 oz (79.6 kg)   BMI 25.17 kg/m   Filed Weights   05/05/18 1344  Weight: 175 lb 6.4 oz (79.6 kg)    Physical Exam  Constitutional: He is oriented to person, place, and time and well-developed, well-nourished, and in no distress.  HENT:  Head: Normocephalic and atraumatic.  Mouth/Throat: Oropharynx is clear and moist. No oropharyngeal exudate.  Eyes: Pupils are equal, round, and reactive to light.  Neck: Normal range of motion. Neck supple.  Cardiovascular: Normal rate and regular rhythm.  Pulmonary/Chest: No respiratory distress. He has no wheezes.  Abdominal: Soft. Bowel sounds are normal. He exhibits no distension and no mass. There is no tenderness. There is no rebound and no guarding.  Musculoskeletal: Normal range of motion. He exhibits no edema or tenderness.  Neurological: He is alert and oriented to person, place, and time.  Skin: Skin is warm.  Psychiatric: Affect normal.     LABORATORY DATA:  I have reviewed the data as listed    Component Value Date/Time   NA 136 02/10/2018 1513   K 4.3 02/10/2018 1513   CL 104 02/10/2018 1513   CO2 25 02/10/2018 1513   GLUCOSE 92 02/10/2018 1513   BUN 16 02/10/2018 1513   CREATININE 0.70 02/10/2018 1513   CREATININE 0.86 02/28/2014 1450   CALCIUM 9.0 02/10/2018 1513   PROT 7.1 02/10/2018 1513   PROT 7.4 02/28/2014 1450   ALBUMIN 3.9 02/10/2018 1513   ALBUMIN 3.1 (L) 02/28/2014 1450   AST 23 02/10/2018 1513   AST 16 02/28/2014 1450   ALT 19 02/10/2018 1513   ALT 40 02/28/2014 1450   ALKPHOS 51 02/10/2018 1513   ALKPHOS 109 02/28/2014 1450   BILITOT 0.7 02/10/2018 1513   BILITOT 0.5 02/28/2014 1450    GFRNONAA >60 02/10/2018 1513   GFRNONAA >60 02/28/2014 1450   GFRAA >60 02/10/2018 1513   GFRAA >60 02/28/2014 1450    No results found for: SPEP, UPEP  Lab Results  Component Value Date   WBC 6.6 05/05/2018   NEUTROABS 3.9 05/05/2018   HGB 14.6 05/05/2018   HCT 42.6 05/05/2018   MCV 106.2 (H) 05/05/2018   PLT 538 (H) 05/05/2018      Chemistry      Component Value Date/Time   NA 136 02/10/2018 1513   K 4.3 02/10/2018 1513   CL 104 02/10/2018 1513   CO2 25 02/10/2018 1513   BUN 16 02/10/2018 1513   CREATININE 0.70 02/10/2018 1513   CREATININE 0.86 02/28/2014 1450      Component Value Date/Time   CALCIUM 9.0 02/10/2018 1513  ALKPHOS 51 02/10/2018 1513   ALKPHOS 109 02/28/2014 1450   AST 23 02/10/2018 1513   AST 16 02/28/2014 1450   ALT 19 02/10/2018 1513   ALT 40 02/28/2014 1450   BILITOT 0.7 02/10/2018 1513   BILITOT 0.5 02/28/2014 1450       ASSESSMENT & PLAN:   Essential thrombocythemia ([place]) # 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  # Continue Hydrea 500 mg/once a day; add M/W/F-  with aspirin 81 mg a day. Platelets today 538 slightly elevated.  #  follow-up in 12 months with labs; 6 months/labs. [pt pref]     Cammie Sickle, MD 05/09/2018 12:03 AM

## 2018-05-05 NOTE — Progress Notes (Signed)
Here for follow up. Stated overall feeling well- except for diff staying asleep -stated he sleeps approx 4 h per night. Going for sleep study he stated.

## 2018-06-21 ENCOUNTER — Telehealth: Payer: Self-pay | Admitting: *Deleted

## 2018-06-21 NOTE — Telephone Encounter (Signed)
Brenda-I would recommend holding of MMR vaccine while on Hydrea.Thx

## 2018-06-21 NOTE — Telephone Encounter (Signed)
Call returned to patient and advised not to take MMR vaccine

## 2018-06-21 NOTE — Telephone Encounter (Signed)
Patient called to report that [facility] has had a Mumps outbreak and are offering MMR He is asking if he should avoid this MMR being on Hydrea. He states he took the Shingrix vaccine and it made him sick for 3 days. Please advise

## 2018-06-23 ENCOUNTER — Telehealth: Payer: Self-pay | Admitting: *Deleted

## 2018-06-23 NOTE — Telephone Encounter (Signed)
Pharmacy will not refill his Hydrea because he was told to take 3 extra capsules a week and the prescription does not reflect this. 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  # Continue Hydrea 500 mg/once a day; add M/W/F-  with aspirin 81 mg a day. Platelets today 538 slightly elevated.  #  follow-up in 12 months with labs; 6 months/labs. [pt pref]     Cammie Sickle, MD 05/09/2018 12:03 AM

## 2018-06-26 ENCOUNTER — Other Ambulatory Visit: Payer: Self-pay

## 2018-06-26 DIAGNOSIS — D473 Essential (hemorrhagic) thrombocythemia: Secondary | ICD-10-CM

## 2018-06-26 MED ORDER — HYDROXYUREA 500 MG PO CAPS: 500.0000 mg | ORAL_CAPSULE | Freq: Every day | ORAL | 6 refills | Status: DC

## 2018-06-26 NOTE — Telephone Encounter (Signed)
New Rx with instructions has been sent.

## 2018-09-18 ENCOUNTER — Telehealth: Payer: Self-pay | Admitting: *Deleted

## 2018-09-18 DIAGNOSIS — D473 Essential (hemorrhagic) thrombocythemia: Secondary | ICD-10-CM

## 2018-09-18 MED ORDER — HYDROXYUREA 500 MG PO CAPS: 500.0000 mg | ORAL_CAPSULE | Freq: Every day | ORAL | 6 refills | Status: DC

## 2018-09-18 NOTE — Telephone Encounter (Signed)
New rx sent to [facility] per md orders.

## 2018-09-18 NOTE — Telephone Encounter (Signed)
-----   Message from Shawnee Knapp, RN sent at 09/18/2018  1:22 PM EST ----- Regarding: Hydroxyurea Script Patient called to state [employer] has changed coverage for his hydroxyurea script.  [facility] is now filling and shipping to him.  They told patient a fax was sent to Dr. Aletha Halim office on 09/16/18 for his refill.  He has two pills left.  [place] # is [phone]

## 2018-10-30 ENCOUNTER — Other Ambulatory Visit: Payer: Self-pay

## 2018-10-30 DIAGNOSIS — D473 Essential (hemorrhagic) thrombocythemia: Secondary | ICD-10-CM

## 2018-11-02 ENCOUNTER — Telehealth: Payer: Self-pay | Admitting: *Deleted

## 2018-11-02 NOTE — Telephone Encounter (Signed)
[facility]- its ok to push labs to 2 months; please re-schedule

## 2018-11-02 NOTE — Telephone Encounter (Signed)
Patient called asking if his lab appointment needs to be pushed out due to social distancing or does he need to come in for it. Last labs drawn 05/05/18 and he is on Hydrea   Ref Range & Units 56mo ago (05/05/18) 36mo ago (02/10/18) 16mo ago (11/03/17) 65yr ago (05/06/17) 57yr ago (11/01/16) 12yr ago (05/03/16) 4yr ago (02/02/16)  WBC 3.8 - 10.6 K/uL 6.6  6.6  5.6  5.5  6.4  6.1  6.8   RBC 4.40 - 5.90 MIL/uL 4.01Low   4.13Low   4.08Low   4.01Low   4.16Low   4.11Low   4.35Low    Hemoglobin 13.0 - 18.0 g/dL 14.6  14.8  14.4  14.4  14.6  14.5  15.1   HCT 40.0 - 52.0 % 42.6  43.1  42.2  40.9  41.9  42.1  44.2   MCV 80.0 - 100.0 fL 106.2High   104.3High   103.4High   101.9High   100.8High   102.5High   101.5High    MCH 26.0 - 34.0 pg 36.4High   35.7High   35.3High   35.9High   35.1High   35.4High   34.7High    MCHC 32.0 - 36.0 g/dL 34.2  34.3  34.1  35.2  34.8  34.5  34.2   RDW 11.5 - 14.5 % 13.7  13.4  13.9  13.4  13.4  14.8High   13.4   Platelets 150 - 440 K/uL 538High   471High   614High   544High   554High   482High   464High    Neutrophils Relative % % 60  63  59  53  52  59  63   Neutro Abs 1.4 - 6.5 K/uL 3.9  4.1  3.3  2.9  3.4  3.6  4.3   Lymphocytes Relative % 24  22  26  31   32  26  16   Lymphs Abs 1.0 - 3.6 K/uL 1.6  1.4  1.4  1.7  2.0  1.6  1.1   Monocytes Relative % 12  11  11  12  12  11  18    Monocytes Absolute 0.2 - 1.0 K/uL 0.8  0.7  0.6  0.7  0.8  0.7  1.2High    Eosinophils Relative % 3  3  2  2  3  2  2    Eosinophils Absolute 0 - 0.7 K/uL 0.2  0.2  0.1  0.1  0.2  0.1  0.1   Basophils Relative % 1  1  2  2  1  2  1    Basophils Absolute 0 - 0.1 K/uL 0.1  0.1 CM 0.1 CM 0.1  0.1  0.1  0.0   Comment: Performed at [facility], [address] [place], [place] [address]  Resulting Agency  [facility] CLIN LAB [place] [facility] LAB [place] CL[facility] LAB [place] CL[facility] LAB [place] [facility] LAB [place] [facility] [place] [facility] LAB      Specimen Collected: 05/05/18 13:22  Last Resulted: 05/05/18 13:32

## 2018-11-03 ENCOUNTER — Inpatient Hospital Stay: Payer: BLUE CROSS/BLUE SHIELD

## 2019-01-05 ENCOUNTER — Inpatient Hospital Stay: Payer: BLUE CROSS/BLUE SHIELD

## 2019-02-01 ENCOUNTER — Encounter: Payer: Self-pay | Admitting: *Deleted

## 2019-02-02 ENCOUNTER — Other Ambulatory Visit: Payer: Self-pay

## 2019-02-05 ENCOUNTER — Other Ambulatory Visit: Payer: Self-pay

## 2019-02-05 ENCOUNTER — Inpatient Hospital Stay: Payer: BC Managed Care – PPO | Attending: Internal Medicine

## 2019-02-05 ENCOUNTER — Telehealth: Payer: Self-pay | Admitting: *Deleted

## 2019-02-05 DIAGNOSIS — Z79899 Other long term (current) drug therapy: Secondary | ICD-10-CM | POA: Diagnosis not present

## 2019-02-05 DIAGNOSIS — Z7982 Long term (current) use of aspirin: Secondary | ICD-10-CM | POA: Insufficient documentation

## 2019-02-05 DIAGNOSIS — D473 Essential (hemorrhagic) thrombocythemia: Secondary | ICD-10-CM

## 2019-02-05 LAB — CBC WITH DIFFERENTIAL/PLATELET
Abs Immature Granulocytes: 0.05 10*3/uL (ref 0.00–0.07)
Basophils Absolute: 0.1 10*3/uL (ref 0.0–0.1)
Basophils Relative: 1 %
Eosinophils Absolute: 0.1 10*3/uL (ref 0.0–0.5)
Eosinophils Relative: 2 %
HCT: 43.6 % (ref 39.0–52.0)
Hemoglobin: 14.9 g/dL (ref 13.0–17.0)
Immature Granulocytes: 1 %
Lymphocytes Relative: 23 %
Lymphs Abs: 1.5 10*3/uL (ref 0.7–4.0)
MCH: 36 pg — ABNORMAL HIGH (ref 26.0–34.0)
MCHC: 34.2 g/dL (ref 30.0–36.0)
MCV: 105.3 fL — ABNORMAL HIGH (ref 80.0–100.0)
Monocytes Absolute: 0.8 10*3/uL (ref 0.1–1.0)
Monocytes Relative: 11 %
Neutro Abs: 4.2 10*3/uL (ref 1.7–7.7)
Neutrophils Relative %: 62 %
Platelets: 446 10*3/uL — ABNORMAL HIGH (ref 150–400)
RBC: 4.14 MIL/uL — ABNORMAL LOW (ref 4.22–5.81)
RDW: 13.3 % (ref 11.5–15.5)
WBC: 6.7 10*3/uL (ref 4.0–10.5)
nRBC: 0 % (ref 0.0–0.2)

## 2019-02-05 LAB — COMPREHENSIVE METABOLIC PANEL
ALT: 18 U/L (ref 0–44)
AST: 23 U/L (ref 15–41)
Albumin: 4.4 g/dL (ref 3.5–5.0)
Alkaline Phosphatase: 58 U/L (ref 38–126)
Anion gap: 9 (ref 5–15)
BUN: 20 mg/dL (ref 8–23)
CO2: 25 mmol/L (ref 22–32)
Calcium: 9.3 mg/dL (ref 8.9–10.3)
Chloride: 101 mmol/L (ref 98–111)
Creatinine, Ser: 0.8 mg/dL (ref 0.61–1.24)
GFR calc Af Amer: >60 mL/min (ref >60)
GFR calc non Af Amer: >60 mL/min (ref >60)
Glucose, Bld: 96 mg/dL (ref 70–99)
Potassium: 4.4 mmol/L (ref 3.5–5.1)
Sodium: 135 mmol/L (ref 135–145)
Total Bilirubin: 1.8 mg/dL — ABNORMAL HIGH (ref 0.3–1.2)
Total Protein: 7.4 g/dL (ref 6.5–8.1)

## 2019-05-02 ENCOUNTER — Other Ambulatory Visit: Payer: Self-pay | Admitting: Internal Medicine

## 2019-05-02 DIAGNOSIS — D473 Essential (hemorrhagic) thrombocythemia: Secondary | ICD-10-CM

## 2019-05-02 NOTE — Telephone Encounter (Signed)
CBC with Differential/Platelet Order: [id number] Status:  Final result Visible to patient:  Yes (MyChart) Next appt:  05/11/2019 at 01:15 PM in Oncology ([facility]) Dx:  Essential thrombocythemia ([place])  Ref Range & Units 12mo ago 4mo ago 65yr ago  WBC 4.0 - 10.5 K/uL 6.7  6.6 R  6.6 R   RBC 4.22 - 5.81 MIL/uL 4.14Low   4.01Low  R  4.13Low  R   Hemoglobin 13.0 - 17.0 g/dL 14.9  14.6 R  14.8 R   HCT 39.0 - 52.0 % 43.6  42.6 R  43.1 R   MCV 80.0 - 100.0 fL 105.3High   106.2High   104.3High    MCH 26.0 - 34.0 pg 36.0High   36.4High   35.7High    MCHC 30.0 - 36.0 g/dL 34.2  34.2 R  34.3 R   RDW 11.5 - 15.5 % 13.3  13.7 R  13.4 R   Platelets 150 - 400 K/uL 446High   538High  R  471High  R   nRBC 0.0 - 0.2 % 0.0     Neutrophils Relative % % 62  60  63   Neutro Abs 1.7 - 7.7 K/uL 4.2  3.9 R  4.1 R   Lymphocytes Relative % 23  24  22    Lymphs Abs 0.7 - 4.0 K/uL 1.5  1.6 R  1.4 R   Monocytes Relative % 11  12  11    Monocytes Absolute 0.1 - 1.0 K/uL 0.8  0.8 R  0.7 R   Eosinophils Relative % 2  3  3    Eosinophils Absolute 0.0 - 0.5 K/uL 0.1  0.2 R  0.2 R   Basophils Relative % 1  1  1    Basophils Absolute 0.0 - 0.1 K/uL 0.1  0.1 R, CM  0.1 R, CM   Immature Granulocytes % 1     Abs Immature Granulocytes 0.00 - 0.07 K/uL 0.05     Comment: Performed at [facility], [address] [place], [place] [address]  Resulting Agency  [facility] CLIN LAB [facility] CLIN LAB [facility] CLIN LAB      Specimen Collected: 02/05/19 16:04 Last Resulted: 02/05/19 16:22     Lab Flowsheet   Order Details   View Encounter   Lab and Collection Details   Routing   Result History     CM=Additional commentsR=Reference range differs from displayed range      Other Results from 02/05/2019  Result Notes for Comprehensive metabolic panel  Notes recorded by Cammie Sickle, MD on 02/08/2019 at 8:27 AM EDT  Good morning-your blood counts look fairly stable to me platelets 446. Continue current dose of Hydrea.  Also bilirubin [measures liver function]-slightly up at 1.8. However looking back it has been up and down in the past; so I am not concerned. Will monitor for now. Call us if questions.  Thanks  Dr.B   Contains abnormal data Comprehensive metabolic panel Order: [id number]  Status:  Final result Visible to patient:  Yes (MyChart) Next appt:  05/11/2019 at 01:15 PM in Oncology ([facility]) Dx:  Essential thrombocythemia ([place])  Ref Range & Units 75mo ago (02/05/19) 48yr ago (02/10/18) 50yr ago (11/03/17)  Sodium 135 - 145 mmol/L 135  136  135   Potassium 3.5 - 5.1 mmol/L 4.4  4.3  3.8   Chloride 98 - 111 mmol/L 101  104 CM  101 R   CO2 22 - 32 mmol/L 25  25  27    Glucose,  Bld 70 - 99 mg/dL 96  92 CM  106High  R   BUN 8 - 23 mg/dL 20  16 CM  13 R   Creatinine, Ser 0.61 - 1.24 mg/dL 0.80  0.70  0.70   Calcium 8.9 - 10.3 mg/dL 9.3  9.0  8.9   Total Protein 6.5 - 8.1 g/dL 7.4  7.1  7.1   Albumin 3.5 - 5.0 g/dL 4.4  3.9  4.0   AST 15 - 41 U/L 23  23  24    ALT 0 - 44 U/L 18  19 CM  18 R   Alkaline Phosphatase 38 - 126 U/L 58  51  70   Total Bilirubin 0.3 - 1.2 mg/dL 1.8High   0.7  0.9   GFR calc non Af Amer >60 mL/min >60  >60  >60   GFR calc Af Amer >60 mL/min >60  >60 CM  >60 CM   Anion gap 5 - 15 9  7  CM  7 CM   Comment: Performed at [facility], [address] [place], [place] [address]  Resulting Agency  [facility] CLIN LAB [facility] CLIN LAB [facility] CLIN LAB      Specimen Collected: 02/05/19 16:04 Last Resulted: 02/05/19 16:31

## 2019-05-10 ENCOUNTER — Encounter: Payer: Self-pay | Admitting: Internal Medicine

## 2019-05-11 ENCOUNTER — Other Ambulatory Visit: Payer: Self-pay

## 2019-05-11 ENCOUNTER — Inpatient Hospital Stay: Payer: BC Managed Care – PPO | Attending: Internal Medicine

## 2019-05-11 ENCOUNTER — Inpatient Hospital Stay (HOSPITAL_BASED_OUTPATIENT_CLINIC_OR_DEPARTMENT_OTHER): Payer: BC Managed Care – PPO | Admitting: Internal Medicine

## 2019-05-11 DIAGNOSIS — Z79899 Other long term (current) drug therapy: Secondary | ICD-10-CM | POA: Diagnosis not present

## 2019-05-11 DIAGNOSIS — Z809 Family history of malignant neoplasm, unspecified: Secondary | ICD-10-CM | POA: Diagnosis not present

## 2019-05-11 DIAGNOSIS — M19049 Primary osteoarthritis, unspecified hand: Secondary | ICD-10-CM | POA: Diagnosis not present

## 2019-05-11 DIAGNOSIS — D473 Essential (hemorrhagic) thrombocythemia: Secondary | ICD-10-CM | POA: Diagnosis not present

## 2019-05-11 DIAGNOSIS — Z9181 History of falling: Secondary | ICD-10-CM | POA: Insufficient documentation

## 2019-05-11 DIAGNOSIS — M160 Bilateral primary osteoarthritis of hip: Secondary | ICD-10-CM | POA: Insufficient documentation

## 2019-05-11 DIAGNOSIS — Z880 Allergy status to penicillin: Secondary | ICD-10-CM | POA: Diagnosis not present

## 2019-05-11 DIAGNOSIS — Z823 Family history of stroke: Secondary | ICD-10-CM | POA: Insufficient documentation

## 2019-05-11 DIAGNOSIS — Z885 Allergy status to narcotic agent status: Secondary | ICD-10-CM | POA: Insufficient documentation

## 2019-05-11 LAB — CBC WITH DIFFERENTIAL/PLATELET
Abs Immature Granulocytes: 0.01 10*3/uL (ref 0.00–0.07)
Basophils Absolute: 0.1 10*3/uL (ref 0.0–0.1)
Basophils Relative: 1 %
Eosinophils Absolute: 0.1 10*3/uL (ref 0.0–0.5)
Eosinophils Relative: 2 %
HCT: 41 % (ref 39.0–52.0)
Hemoglobin: 14 g/dL (ref 13.0–17.0)
Immature Granulocytes: 0 %
Lymphocytes Relative: 25 %
Lymphs Abs: 1.7 10*3/uL (ref 0.7–4.0)
MCH: 36.6 pg — ABNORMAL HIGH (ref 26.0–34.0)
MCHC: 34.1 g/dL (ref 30.0–36.0)
MCV: 107 fL — ABNORMAL HIGH (ref 80.0–100.0)
Monocytes Absolute: 0.8 10*3/uL (ref 0.1–1.0)
Monocytes Relative: 12 %
Neutro Abs: 4.1 10*3/uL (ref 1.7–7.7)
Neutrophils Relative %: 60 %
Platelets: 464 10*3/uL — ABNORMAL HIGH (ref 150–400)
RBC: 3.83 MIL/uL — ABNORMAL LOW (ref 4.22–5.81)
RDW: 12.5 % (ref 11.5–15.5)
WBC: 6.8 10*3/uL (ref 4.0–10.5)
nRBC: 0 % (ref 0.0–0.2)

## 2019-05-11 LAB — COMPREHENSIVE METABOLIC PANEL
ALT: 16 U/L (ref 0–44)
AST: 19 U/L (ref 15–41)
Albumin: 3.9 g/dL (ref 3.5–5.0)
Alkaline Phosphatase: 53 U/L (ref 38–126)
Anion gap: 7 (ref 5–15)
BUN: 17 mg/dL (ref 8–23)
CO2: 28 mmol/L (ref 22–32)
Calcium: 9.1 mg/dL (ref 8.9–10.3)
Chloride: 102 mmol/L (ref 98–111)
Creatinine, Ser: 0.78 mg/dL (ref 0.61–1.24)
GFR calc Af Amer: >60 mL/min (ref >60)
GFR calc non Af Amer: >60 mL/min (ref >60)
Glucose, Bld: 101 mg/dL — ABNORMAL HIGH (ref 70–99)
Potassium: 4.2 mmol/L (ref 3.5–5.1)
Sodium: 137 mmol/L (ref 135–145)
Total Bilirubin: 1.5 mg/dL — ABNORMAL HIGH (ref 0.3–1.2)
Total Protein: 6.7 g/dL (ref 6.5–8.1)

## 2019-05-11 MED ORDER — HYDROXYUREA 500 MG PO CAPS: ORAL_CAPSULE | ORAL | 12 refills | Status: DC

## 2019-05-11 NOTE — Assessment & Plan Note (Addendum)
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  Also discussed the rare possibility of acute transformation to leukemia/myelofibrosis.  No concerns at this time.  # Continue Hydrea 500 mg/once a day; with extra pill on M/W/F-  with aspirin 81 mg a day. Platelets today 464 slightly elevated.  # DISPOSITION: #  6 months/labs. [pt pref] # Follow-up in 12 months- MD/labs-cbc/cmp/ldh-Dr.B

## 2019-05-11 NOTE — Progress Notes (Signed)
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 On zantac.    # Left hip replacement ------------------------------------------------   DIAGNOSIS: [ ]   STAGE:         ;GOALS:  CURRENT/MOST RECENT THERAPY [ ]       Essential thrombocythemia ([place])     INTERVAL HISTORY:  A very pleasant 63 year-old male patient with above history of essential thrombocytosis currently on low-dose Hydrea is here for follow-up.  Patient denies any strokes but denies blood clots.  No nausea no vomiting.  No weight loss.  No night sweats.   Review of Systems  Constitutional: Negative for chills, diaphoresis, fever, malaise/fatigue and weight loss.  HENT: Negative for nosebleeds and sore throat.   Eyes: Negative for double vision.  Respiratory: Negative for cough, hemoptysis, sputum production, shortness of breath and wheezing.   Cardiovascular: Negative for chest pain, palpitations, orthopnea and leg swelling.  Gastrointestinal: Negative for abdominal pain, blood in stool, constipation, diarrhea, heartburn, melena, nausea and vomiting.  Genitourinary: Negative for dysuria, frequency and urgency.  Musculoskeletal: Negative for back pain and joint pain.  Skin: Negative.  Negative for itching and rash.  Neurological: Negative for dizziness, tingling, focal weakness, weakness and headaches.  Endo/Heme/Allergies: Does not bruise/bleed easily.  Psychiatric/Behavioral: Negative for depression. The patient is not nervous/anxious and does not have insomnia.       PAST MEDICAL HISTORY :  Past  Medical History:  Diagnosis Date  . Arthritis    osteoarthritis -hips,spine,hands and left wrist.  . Arthritis   . Complication of anesthesia   . GERD (gastroesophageal reflux disease)   . Pneumonia   . PONV (postoperative nausea and vomiting)   . Thrombocytosis ([place])   . Unspecified diseases of blood and blood-forming organs    "essential hyperthrombocytosis"    PAST SURGICAL HISTORY :   Past Surgical History:  Procedure Laterality Date  . BACK SURGERY  June 2015  . ROTATOR CUFF REPAIR     x3(lt x2), rt. x1  . TOTAL HIP ARTHROPLASTY Left 02/11/2014   Procedure: LEFT TOTAL HIP ARTHROPLASTY ANTERIOR APPROACH;  Surgeon: Gearlean Alf, MD;  Location: [facility];  Service: Orthopedics;  Laterality: Left;    FAMILY HISTORY :   Family History  Problem Relation Age of Onset  . Cancer Maternal Aunt   . Cancer Paternal Aunt   . Stroke Maternal Grandfather     SOCIAL HISTORY:   Social History   Tobacco Use  . Smoking status: Never Smoker  . Smokeless tobacco: Never Used  Substance Use Topics  . Alcohol use: Yes    Alcohol/week: 2.0 standard drinks    Types: 2 Cans of beer per week    Comment: beer/ wine 2glasses  per night  . Drug use: No    ALLERGIES:  is allergic to vicodin [hydrocodone-acetaminophen] and penicillins.  MEDICATIONS:  Current Outpatient Medications  Medication Sig Dispense Refill  . aspirin 81 MG tablet Take 81 mg by mouth daily.    Marland Kitchen EPINEPHrine 0.3 mg/0.3 mL IJ SOAJ injection INJECT INTRAMUSCULARLY AS DIRECTED  0  . hydroxyurea (HYDREA) 500 MG capsule TAKE TWO CAPSULES BY  MOUTH ONCE DAILY ON MONDAYS, [place], AND FRIDAYS. TAKE ONLY ONE CAPSULE ON OTHER DAYS OF THE WEEK 45 capsule 12  . ibuprofen (ADVIL,MOTRIN) 800 MG tablet Take 800 mg by mouth every 8 (eight) hours as needed.    . loratadine (CLARITIN) 10 MG tablet Take 10 mg by mouth daily.    . Melatonin 10 MG TABS Take 1 tablet by mouth at bedtime.     Marland Kitchen VIAGRA 100 MG tablet Take 100 mg by mouth  as needed.   0   No current facility-administered medications for this visit.     PHYSICAL EXAMINATION: ECOG PERFORMANCE STATUS: 0 - Asymptomatic  BP 120/79 (BP Location: Left Arm, Patient Position: Sitting, Cuff Size: Normal)   Pulse 67   Temp (!) 97.2 F (36.2 C) (Tympanic)   Wt 171 lb (77.6 kg)   BMI 24.54 kg/m   Filed Weights   05/10/19 1618  Weight: 171 lb (77.6 kg)    Physical Exam  Constitutional: He is oriented to person, place, and time and well-developed, well-nourished, and in no distress.  HENT:  Head: Normocephalic and atraumatic.  Mouth/Throat: Oropharynx is clear and moist. No oropharyngeal exudate.  Eyes: Pupils are equal, round, and reactive to light.  Neck: Normal range of motion. Neck supple.  Cardiovascular: Normal rate and regular rhythm.  Pulmonary/Chest: No respiratory distress. He has no wheezes.  Abdominal: Soft. Bowel sounds are normal. He exhibits no distension and no mass. There is no abdominal tenderness. There is no rebound and no guarding.  Musculoskeletal: Normal range of motion.        General: No tenderness or edema.  Neurological: He is alert and oriented to person, place, and time.  Skin: Skin is warm.  Psychiatric: Affect normal.   LABORATORY DATA:  I have reviewed the data as listed    Component Value Date/Time   NA 137 05/11/2019 1338   K 4.2 05/11/2019 1338   CL 102 05/11/2019 1338   CO2 28 05/11/2019 1338   GLUCOSE 101 (H) 05/11/2019 1338   BUN 17 05/11/2019 1338   CREATININE 0.78 05/11/2019 1338   CREATININE 0.86 02/28/2014 1450   CALCIUM 9.1 05/11/2019 1338   PROT 6.7 05/11/2019 1338   PROT 7.4 02/28/2014 1450   ALBUMIN 3.9 05/11/2019 1338   ALBUMIN 3.1 (L) 02/28/2014 1450   AST 19 05/11/2019 1338   AST 16 02/28/2014 1450   ALT 16 05/11/2019 1338   ALT 40 02/28/2014 1450   ALKPHOS 53 05/11/2019 1338   ALKPHOS 109 02/28/2014 1450   BILITOT 1.5 (H) 05/11/2019 1338   BILITOT 0.5 02/28/2014 1450   GFRNONAA >60  05/11/2019 1338   GFRNONAA >60 02/28/2014 1450   GFRAA >60 05/11/2019 1338   GFRAA >60 02/28/2014 1450    No results found for: SPEP, UPEP  Lab Results  Component Value Date   WBC 6.8 05/11/2019   NEUTROABS 4.1 05/11/2019   HGB 14.0 05/11/2019   HCT 41.0 05/11/2019   MCV 107.0 (H) 05/11/2019   PLT 464 (H) 05/11/2019      Chemistry      Component Value Date/Time   NA 137 05/11/2019 1338   K 4.2 05/11/2019 1338   CL 102 05/11/2019 1338   CO2 28 05/11/2019 1338   BUN 17 05/11/2019 1338   CREATININE 0.78 05/11/2019 1338   CREATININE 0.86 02/28/2014 1450      Component Value Date/Time   CALCIUM 9.1 05/11/2019 1338   ALKPHOS 53 05/11/2019 1338   ALKPHOS 109  02/28/2014 1450   AST 19 05/11/2019 1338   AST 16 02/28/2014 1450   ALT 16 05/11/2019 1338   ALT 40 02/28/2014 1450   BILITOT 1.5 (H) 05/11/2019 1338   BILITOT 0.5 02/28/2014 1450      ASSESSMENT & PLAN:   Essential thrombocythemia ([place]) # 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  Also discussed the rare possibility of acute transformation to leukemia/myelofibrosis.  No concerns at this time.  # Continue Hydrea 500 mg/once a day; with extra pill on M/W/F-  with aspirin 81 mg a day. Platelets today 464 slightly elevated.  # DISPOSITION: #  6 months/labs. [pt pref] # Follow-up in 12 months- MD/labs-cbc/cmp/ldh-Dr.B     Cammie Sickle, MD 05/11/2019 3:06 PM

## 2019-07-03 ENCOUNTER — Other Ambulatory Visit: Payer: Self-pay | Admitting: Sports Medicine

## 2019-07-03 DIAGNOSIS — Z9889 Other specified postprocedural states: Secondary | ICD-10-CM

## 2019-07-03 DIAGNOSIS — G8929 Other chronic pain: Secondary | ICD-10-CM

## 2019-07-03 DIAGNOSIS — S46011S Strain of muscle(s) and tendon(s) of the rotator cuff of right shoulder, sequela: Secondary | ICD-10-CM

## 2019-07-24 ENCOUNTER — Other Ambulatory Visit: Payer: Self-pay

## 2019-07-24 ENCOUNTER — Ambulatory Visit
Admission: RE | Admit: 2019-07-24 | Discharge: 2019-07-24 | Disposition: A | Discharge status: Home / Self Care | Payer: BC Managed Care – PPO | Source: Ambulatory Visit | Attending: Sports Medicine | Admitting: Sports Medicine

## 2019-07-24 DIAGNOSIS — M25511 Pain in right shoulder: Secondary | ICD-10-CM | POA: Diagnosis not present

## 2019-07-24 DIAGNOSIS — G8929 Other chronic pain: Secondary | ICD-10-CM | POA: Diagnosis present

## 2019-07-24 DIAGNOSIS — Z9889 Other specified postprocedural states: Secondary | ICD-10-CM | POA: Diagnosis present

## 2019-07-24 DIAGNOSIS — S46011S Strain of muscle(s) and tendon(s) of the rotator cuff of right shoulder, sequela: Secondary | ICD-10-CM | POA: Insufficient documentation

## 2019-10-25 ENCOUNTER — Ambulatory Visit: Payer: BC Managed Care – PPO | Attending: Internal Medicine

## 2019-10-25 DIAGNOSIS — Z23 Encounter for immunization: Secondary | ICD-10-CM

## 2019-10-25 NOTE — Progress Notes (Signed)
   Covid-19 Vaccination Clinic  Name:  SAYRE CASTLES    MRN: [id number] DOB: 1956/04/14  10/25/2019  Mr. Pickron was observed post Covid-19 immunization for 30 minutes based on pre-vaccination screening without incident. He was provided with Vaccine Information Sheet and instruction to access the V-Safe system.   Mr. Loeb was instructed to call 911 with any severe reactions post vaccine: Marland Kitchen Difficulty breathing  . Swelling of face and throat  . A fast heartbeat  . A bad rash all over body  . Dizziness and weakness   Immunizations Administered    Name Date Dose VIS Date Route   Pfizer COVID-19 Vaccine 10/25/2019 10:21 AM 0.3 mL 07/27/2019 Intramuscular   Manufacturer: [place]   Lot: [id number]   [place]: [id number]

## 2019-11-09 ENCOUNTER — Inpatient Hospital Stay: Payer: BC Managed Care – PPO | Attending: Internal Medicine

## 2019-11-09 DIAGNOSIS — D473 Essential (hemorrhagic) thrombocythemia: Secondary | ICD-10-CM | POA: Diagnosis present

## 2019-11-09 LAB — COMPREHENSIVE METABOLIC PANEL
ALT: 18 U/L (ref 0–44)
AST: 21 U/L (ref 15–41)
Albumin: 3.9 g/dL (ref 3.5–5.0)
Alkaline Phosphatase: 57 U/L (ref 38–126)
Anion gap: 7 (ref 5–15)
BUN: 15 mg/dL (ref 8–23)
CO2: 26 mmol/L (ref 22–32)
Calcium: 9.1 mg/dL (ref 8.9–10.3)
Chloride: 102 mmol/L (ref 98–111)
Creatinine, Ser: 0.8 mg/dL (ref 0.61–1.24)
GFR calc Af Amer: >60 mL/min (ref >60)
GFR calc non Af Amer: >60 mL/min (ref >60)
Glucose, Bld: 101 mg/dL — ABNORMAL HIGH (ref 70–99)
Potassium: 4.1 mmol/L (ref 3.5–5.1)
Sodium: 135 mmol/L (ref 135–145)
Total Bilirubin: 1 mg/dL (ref 0.3–1.2)
Total Protein: 7 g/dL (ref 6.5–8.1)

## 2019-11-09 LAB — CBC WITH DIFFERENTIAL/PLATELET
Abs Immature Granulocytes: 0.02 10*3/uL (ref 0.00–0.07)
Basophils Absolute: 0.1 10*3/uL (ref 0.0–0.1)
Basophils Relative: 1 %
Eosinophils Absolute: 0.1 10*3/uL (ref 0.0–0.5)
Eosinophils Relative: 2 %
HCT: 41.6 % (ref 39.0–52.0)
Hemoglobin: 14.3 g/dL (ref 13.0–17.0)
Immature Granulocytes: 0 %
Lymphocytes Relative: 24 %
Lymphs Abs: 1.5 10*3/uL (ref 0.7–4.0)
MCH: 36.6 pg — ABNORMAL HIGH (ref 26.0–34.0)
MCHC: 34.4 g/dL (ref 30.0–36.0)
MCV: 106.4 fL — ABNORMAL HIGH (ref 80.0–100.0)
Monocytes Absolute: 0.7 10*3/uL (ref 0.1–1.0)
Monocytes Relative: 12 %
Neutro Abs: 3.9 10*3/uL (ref 1.7–7.7)
Neutrophils Relative %: 61 %
Platelets: 476 10*3/uL — ABNORMAL HIGH (ref 150–400)
RBC: 3.91 MIL/uL — ABNORMAL LOW (ref 4.22–5.81)
RDW: 13 % (ref 11.5–15.5)
WBC: 6.3 10*3/uL (ref 4.0–10.5)
nRBC: 0 % (ref 0.0–0.2)

## 2019-11-09 LAB — LACTATE DEHYDROGENASE: LDH: 121 U/L (ref 98–192)

## 2019-11-21 ENCOUNTER — Ambulatory Visit: Payer: BC Managed Care – PPO | Attending: Internal Medicine

## 2019-11-21 DIAGNOSIS — Z23 Encounter for immunization: Secondary | ICD-10-CM

## 2019-11-21 NOTE — Progress Notes (Signed)
   Covid-19 Vaccination Clinic  Name:  Joshua Cabrera    MRN: [id number] DOB: [birth year]  11/21/2019  Joshua Cabrera was observed post Covid-19 immunization for 30 minutes based on pre-vaccination screening without incident. He was provided with Vaccine Information Sheet and instruction to access the V-Safe system.   Joshua Cabrera was instructed to call 911 with any severe reactions post vaccine: Marland Kitchen Difficulty breathing  . Swelling of face and throat  . A fast heartbeat  . A bad rash all over body  . Dizziness and weakness   Immunizations Administered    Name Date Dose VIS Date Route   Pfizer COVID-19 Vaccine 11/21/2019  2:26 PM 0.3 mL 07/27/2019 Intramuscular   Manufacturer: [place]   Lot: [id number]   [place]: [id number]

## 2020-04-11 ENCOUNTER — Other Ambulatory Visit: Payer: Self-pay | Admitting: Internal Medicine

## 2020-04-11 DIAGNOSIS — D473 Essential (hemorrhagic) thrombocythemia: Secondary | ICD-10-CM

## 2020-04-14 ENCOUNTER — Other Ambulatory Visit: Payer: Self-pay | Admitting: Gastroenterology

## 2020-04-14 DIAGNOSIS — K2971 Gastritis, unspecified, with bleeding: Secondary | ICD-10-CM

## 2020-04-25 ENCOUNTER — Other Ambulatory Visit: Payer: Self-pay | Admitting: Gastroenterology

## 2020-04-25 ENCOUNTER — Ambulatory Visit
Admission: RE | Admit: 2020-04-25 | Discharge: 2020-04-25 | Disposition: A | Discharge status: Home / Self Care | Payer: BC Managed Care – PPO | Source: Ambulatory Visit | Attending: Gastroenterology | Admitting: Gastroenterology

## 2020-04-25 DIAGNOSIS — K2971 Gastritis, unspecified, with bleeding: Secondary | ICD-10-CM

## 2020-05-12 ENCOUNTER — Other Ambulatory Visit: Payer: Self-pay

## 2020-05-12 ENCOUNTER — Inpatient Hospital Stay (HOSPITAL_BASED_OUTPATIENT_CLINIC_OR_DEPARTMENT_OTHER): Payer: BC Managed Care – PPO | Admitting: Internal Medicine

## 2020-05-12 ENCOUNTER — Inpatient Hospital Stay: Payer: BC Managed Care – PPO | Attending: Internal Medicine

## 2020-05-12 VITALS — BP 107/75 | HR 68 | Temp 97.7°F | Resp 16 | Ht 70.0 in | Wt 184.5 lb

## 2020-05-12 DIAGNOSIS — D473 Essential (hemorrhagic) thrombocythemia: Secondary | ICD-10-CM | POA: Insufficient documentation

## 2020-05-12 DIAGNOSIS — Z880 Allergy status to penicillin: Secondary | ICD-10-CM | POA: Diagnosis not present

## 2020-05-12 DIAGNOSIS — Z79899 Other long term (current) drug therapy: Secondary | ICD-10-CM | POA: Insufficient documentation

## 2020-05-12 DIAGNOSIS — Z885 Allergy status to narcotic agent status: Secondary | ICD-10-CM | POA: Diagnosis not present

## 2020-05-12 LAB — CBC WITH DIFFERENTIAL/PLATELET
Abs Immature Granulocytes: 0.02 10*3/uL (ref 0.00–0.07)
Basophils Absolute: 0.1 10*3/uL (ref 0.0–0.1)
Basophils Relative: 2 %
Eosinophils Absolute: 0.2 10*3/uL (ref 0.0–0.5)
Eosinophils Relative: 3 %
HCT: 41.6 % (ref 39.0–52.0)
Hemoglobin: 14.7 g/dL (ref 13.0–17.0)
Immature Granulocytes: 0 %
Lymphocytes Relative: 29 %
Lymphs Abs: 2.1 10*3/uL (ref 0.7–4.0)
MCH: 36.6 pg — ABNORMAL HIGH (ref 26.0–34.0)
MCHC: 35.3 g/dL (ref 30.0–36.0)
MCV: 103.5 fL — ABNORMAL HIGH (ref 80.0–100.0)
Monocytes Absolute: 1 10*3/uL (ref 0.1–1.0)
Monocytes Relative: 14 %
Neutro Abs: 3.8 10*3/uL (ref 1.7–7.7)
Neutrophils Relative %: 52 %
Platelets: 433 10*3/uL — ABNORMAL HIGH (ref 150–400)
RBC: 4.02 MIL/uL — ABNORMAL LOW (ref 4.22–5.81)
RDW: 13.6 % (ref 11.5–15.5)
WBC: 7.2 10*3/uL (ref 4.0–10.5)
nRBC: 0 % (ref 0.0–0.2)

## 2020-05-12 LAB — COMPREHENSIVE METABOLIC PANEL
ALT: 19 U/L (ref 0–44)
AST: 21 U/L (ref 15–41)
Albumin: 3.8 g/dL (ref 3.5–5.0)
Alkaline Phosphatase: 65 U/L (ref 38–126)
Anion gap: 6 (ref 5–15)
BUN: 19 mg/dL (ref 8–23)
CO2: 28 mmol/L (ref 22–32)
Calcium: 8.4 mg/dL — ABNORMAL LOW (ref 8.9–10.3)
Chloride: 101 mmol/L (ref 98–111)
Creatinine, Ser: 0.85 mg/dL (ref 0.61–1.24)
GFR calc Af Amer: >60 mL/min (ref >60)
GFR calc non Af Amer: >60 mL/min (ref >60)
Glucose, Bld: 102 mg/dL — ABNORMAL HIGH (ref 70–99)
Potassium: 4 mmol/L (ref 3.5–5.1)
Sodium: 135 mmol/L (ref 135–145)
Total Bilirubin: 1.2 mg/dL (ref 0.3–1.2)
Total Protein: 6.7 g/dL (ref 6.5–8.1)

## 2020-05-12 LAB — LACTATE DEHYDROGENASE: LDH: 121 U/L (ref 98–192)

## 2020-05-12 NOTE — Assessment & Plan Note (Addendum)
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  STABLE.  Also discussed the rare possibility of acute transformation to leukemia/myelofibrosis.  No concerns at this time.  # Continue Hydrea 500 mg/once a day; with extra pill on M/W/F-  with aspirin 81 mg a day. Platelets today 433 slightly elevated.  # rectal bleeding in July-aug 2021 s/p colo-NEG [as per pt; Dr.Gammon; [place]] CT A/P NEG; Hb 14.7.  Recommend follow-up with GI if continued bleeding noted.  # DISPOSITION: #  6 months/labs. [pt pref] # Follow-up in 12 months- MD/labs-cbc/cmp/ldh-Dr.B

## 2020-05-12 NOTE — Progress Notes (Signed)
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 On zantac.    # Left hip replacement ------------------------------------------------   DIAGNOSIS: [ ]   STAGE:         ;GOALS:  CURRENT/MOST RECENT THERAPY [ ]       Essential thrombocythemia ([place])     INTERVAL HISTORY:  [name] with above history of essential thrombocytosis currently on low-dose Hydrea is here for follow-up.  Patient had few episodes of rectal bleeding summer early fall for which he had a colonoscopy.  As per patient negative.  He also had a CT scan in early September 2021-again negative for any acute bleeding.  Denies any strokes.  No history of blood clots.  No weight loss.  No night sweats.  Review of Systems  Constitutional: Negative for chills, diaphoresis, fever, malaise/fatigue and weight loss.  HENT: Negative for nosebleeds and sore throat.   Eyes: Negative for double vision.  Respiratory: Negative for cough, hemoptysis, sputum production, shortness of breath and wheezing.   Cardiovascular: Negative for chest pain, palpitations, orthopnea and leg swelling.  Gastrointestinal: Negative for abdominal pain, blood in stool, constipation, diarrhea, heartburn, melena, nausea and vomiting.  Genitourinary: Negative for dysuria, frequency and urgency.  Musculoskeletal: Negative for back pain and joint pain.  Skin: Negative.  Negative for itching and rash.  Neurological: Negative for dizziness, tingling, focal weakness, weakness and headaches.  Endo/Heme/Allergies: Does  not bruise/bleed easily.  Psychiatric/Behavioral: Negative for depression. The patient is not nervous/anxious and does not have insomnia.       PAST MEDICAL HISTORY :  Past Medical History:  Diagnosis Date  . Arthritis    osteoarthritis -hips,spine,hands and left wrist.  . Arthritis   . Complication of anesthesia   . GERD (gastroesophageal reflux disease)   . Pneumonia   . PONV (postoperative nausea and vomiting)   . Thrombocytosis ([place])   . Unspecified diseases of blood and blood-forming organs    "essential hyperthrombocytosis"    PAST SURGICAL HISTORY :   Past Surgical History:  Procedure Laterality Date  . BACK SURGERY  June 2015  . ROTATOR CUFF REPAIR     x3(lt x2), rt. x1  . TOTAL HIP ARTHROPLASTY Left 02/11/2014   Procedure: LEFT TOTAL HIP ARTHROPLASTY ANTERIOR APPROACH;  Surgeon: Gearlean Alf, MD;  Location: [facility];  Service: Orthopedics;  Laterality: Left;    FAMILY HISTORY :   Family History  Problem Relation Age of Onset  . Cancer Maternal Aunt   . Cancer Paternal Aunt   . Stroke Maternal Grandfather     SOCIAL HISTORY:   Social History   Tobacco Use  . Smoking status: Never Smoker  . Smokeless tobacco: Never Used  Substance Use Topics  . Alcohol use: Yes    Alcohol/week: 2.0 standard drinks    Types: 2 Cans of beer per week    Comment: beer/ wine 2glasses  per night  . Drug use: No    ALLERGIES:  is allergic to vicodin [hydrocodone-acetaminophen], other, and penicillins.  MEDICATIONS:  Current Outpatient Medications  Medication Sig Dispense Refill  . aspirin 81 MG tablet  Take 81 mg by mouth daily.    Marland Kitchen EPINEPHrine 0.3 mg/0.3 mL IJ SOAJ injection INJECT INTRAMUSCULARLY AS DIRECTED  0  . hydroxyurea (HYDREA) 500 MG capsule TAKE TWO CAPSULES DAILY ON MONDAYS, WEDNESDAYS, AND FRIDAYS. TAKE ONLY ONE CAPSULE ON OTHER DAYS OF THE WEEK 45 capsule 12  . ibuprofen (ADVIL,MOTRIN) 800 MG tablet Take 800 mg by mouth every 8 (eight) hours as needed.    .  loratadine (CLARITIN) 10 MG tablet Take 10 mg by mouth daily.    Marland Kitchen VIAGRA 100 MG tablet Take 100 mg by mouth as needed.   0   No current facility-administered medications for this visit.    PHYSICAL EXAMINATION: ECOG PERFORMANCE STATUS: 0 - Asymptomatic  BP 107/75 (BP Location: Right Arm, Patient Position: Sitting, Cuff Size: Normal)   Pulse 68   Temp 97.7 F (36.5 C) (Tympanic)   Resp 16   Ht 5\' 10"  (1.778 m)   Wt 184 lb 8 oz (83.7 kg)   SpO2 98%   BMI 26.47 kg/m   Filed Weights   05/12/20 1438  Weight: 184 lb 8 oz (83.7 kg)    Physical Exam HENT:     Head: Normocephalic and atraumatic.     Mouth/Throat:     Pharynx: No oropharyngeal exudate.  Eyes:     Pupils: Pupils are equal, round, and reactive to light.  Cardiovascular:     Rate and Rhythm: Normal rate and regular rhythm.  Pulmonary:     Effort: No respiratory distress.     Breath sounds: No wheezing.  Abdominal:     General: Bowel sounds are normal. There is no distension.     Palpations: Abdomen is soft. There is no mass.     Tenderness: There is no abdominal tenderness. There is no guarding or rebound.  Musculoskeletal:        General: No tenderness. Normal range of motion.     Cervical back: Normal range of motion and neck supple.  Skin:    General: Skin is warm.  Neurological:     Mental Status: He is alert and oriented to person, place, and time.  Psychiatric:        Mood and Affect: Affect normal.    LABORATORY DATA:  I have reviewed the data as listed    Component Value Date/Time   NA 135 05/12/2020 1423   K 4.0 05/12/2020 1423   CL 101 05/12/2020 1423   CO2 28 05/12/2020 1423   GLUCOSE 102 (H) 05/12/2020 1423   BUN 19 05/12/2020 1423   CREATININE 0.85 05/12/2020 1423   CREATININE 0.86 02/28/2014 1450   CALCIUM 8.4 (L) 05/12/2020 1423   PROT 6.7 05/12/2020 1423   PROT 7.4 02/28/2014 1450   ALBUMIN 3.8 05/12/2020 1423   ALBUMIN 3.1 (L) 02/28/2014 1450   AST 21 05/12/2020 1423   AST 16  02/28/2014 1450   ALT 19 05/12/2020 1423   ALT 40 02/28/2014 1450   ALKPHOS 65 05/12/2020 1423   ALKPHOS 109 02/28/2014 1450   BILITOT 1.2 05/12/2020 1423   BILITOT 0.5 02/28/2014 1450   GFRNONAA >60 05/12/2020 1423   GFRNONAA >60 02/28/2014 1450   GFRAA >60 05/12/2020 1423   GFRAA >60 02/28/2014 1450    No results found for: SPEP, UPEP  Lab Results  Component Value Date   WBC 7.2 05/12/2020   NEUTROABS 3.8 05/12/2020   HGB 14.7 05/12/2020   HCT 41.6 05/12/2020   MCV 103.5 (H) 05/12/2020   PLT 433 (  H) 05/12/2020      Chemistry      Component Value Date/Time   NA 135 05/12/2020 1423   K 4.0 05/12/2020 1423   CL 101 05/12/2020 1423   CO2 28 05/12/2020 1423   BUN 19 05/12/2020 1423   CREATININE 0.85 05/12/2020 1423   CREATININE 0.86 02/28/2014 1450      Component Value Date/Time   CALCIUM 8.4 (L) 05/12/2020 1423   ALKPHOS 65 05/12/2020 1423   ALKPHOS 109 02/28/2014 1450   AST 21 05/12/2020 1423   AST 16 02/28/2014 1450   ALT 19 05/12/2020 1423   ALT 40 02/28/2014 1450   BILITOT 1.2 05/12/2020 1423   BILITOT 0.5 02/28/2014 1450      ASSESSMENT & PLAN:   Essential thrombocythemia ([place]) # 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  Also discussed the rare possibility of acute transformation to leukemia/myelofibrosis.  No concerns at this time.  # Continue Hydrea 500 mg/once a day; with extra pill on M/W/F-  with aspirin 81 mg a day. Platelets today 433 slightly elevated.  # rectal bleeding in July-aug 2021 s/p colo-NEG [as per pt; Dr.Gammon; [place]] CT A/P NEG; Hb 14.7.  Recommend follow-up with GI if continued bleeding noted.  # DISPOSITION: #  6 months/labs. [pt pref] # Follow-up in 12 months- MD/labs-cbc/cmp/ldh-Dr.B     Cammie Sickle, MD 05/12/2020 3:06 PM

## 2020-06-27 DIAGNOSIS — Z9989 Dependence on other enabling machines and devices: Secondary | ICD-10-CM | POA: Insufficient documentation

## 2020-06-27 DIAGNOSIS — G4733 Obstructive sleep apnea (adult) (pediatric): Secondary | ICD-10-CM | POA: Insufficient documentation

## 2020-08-29 ENCOUNTER — Other Ambulatory Visit: Payer: Self-pay

## 2020-08-29 ENCOUNTER — Telehealth: Payer: Self-pay | Admitting: Nurse Practitioner

## 2020-08-29 DIAGNOSIS — R059 Cough, unspecified: Secondary | ICD-10-CM

## 2020-08-29 MED ORDER — BENZONATATE 100 MG PO CAPS: 100.0000 mg | ORAL_CAPSULE | Freq: Three times a day (TID) | ORAL | 0 refills | Status: AC | PRN

## 2020-08-29 MED ORDER — AZITHROMYCIN 250 MG PO TABS: ORAL_TABLET | ORAL | 0 refills | Status: DC

## 2020-08-29 NOTE — Progress Notes (Signed)
   Subjective:    Patient ID: Joshua Cabrera, male    DOB: Dec 15, 1955, 65 y.o.   MRN: [id number]  HPI  65 year old presenting to [facility] wellness clinic with complaints of coughing for one month  Coughing fits mostly at night Dry cough that "sounds croupy" per patient   Denies tobacco use   Covid testing 2 weeks ago negative   Denies fever or night sweats   No asthma as a child  He has been getting allergy injections weekly for the past  6-8 months   No travel out of the country  Not currently on any allergy medications OTC or oral  Has used inhaler in the past for ongoing respiratory illnesses but denies regular use and denied tightness or wheezing associated with this cough  Constant tickle in the back of throat   Has been taking OTC night time cough suppressant     Past Medical History:  Diagnosis Date  . Arthritis    osteoarthritis -hips,spine,hands and left wrist.  . Arthritis   . Complication of anesthesia   . GERD (gastroesophageal reflux disease)   . Pneumonia   . PONV (postoperative nausea and vomiting)   . Thrombocytosis ([place])   . Unspecified diseases of blood and blood-forming organs    "essential hyperthrombocytosis"       Review of Systems  Constitutional: Negative.   HENT: Positive for postnasal drip.   Respiratory: Positive for cough.   Cardiovascular: Negative.   Gastrointestinal: Negative.   Genitourinary: Negative.   Neurological: Negative.        Objective:   Physical Exam  This was a telehelath appointment with patient, dry cough witnessed during call, patient was able to hold conversation without acute distress       Assessment & Plan:  Advised consistent decongestant use for the next 3-5 days, maintain hydration, benzonatate as needed for cough, azithromycin due to length of cough.   Honey oral as needed to aid in cough relief   If no improvement in one week consider follow up in person and referral for chest XRAY as  needed.   Meds ordered this encounter  Medications  . azithromycin (ZITHROMAX) 250 MG tablet    Sig: Take 2 tablets on day one, then one tablet daily on days 2-5. Take with food    Dispense:  6 tablet    Refill:  0  . benzonatate (TESSALON) 100 MG capsule    Sig: Take 1 capsule (100 mg total) by mouth 3 (three) times daily as needed for up to 10 days for cough (take 1-2 capsules up to three times daily (6 capsules max in 24 hours) as needed for cough. Do not chew).    Dispense:  30 capsule    Refill:  0

## 2020-09-03 ENCOUNTER — Other Ambulatory Visit: Payer: Self-pay

## 2020-09-03 ENCOUNTER — Encounter: Payer: Self-pay | Admitting: Nurse Practitioner

## 2020-09-03 ENCOUNTER — Ambulatory Visit
Admission: RE | Admit: 2020-09-03 | Discharge: 2020-09-03 | Disposition: A | Discharge status: Home / Self Care | Payer: BC Managed Care – PPO | Source: Ambulatory Visit | Attending: Nurse Practitioner | Admitting: Nurse Practitioner

## 2020-09-03 ENCOUNTER — Ambulatory Visit
Admission: RE | Admit: 2020-09-03 | Discharge: 2020-09-03 | Disposition: A | Discharge status: Home / Self Care | Payer: BC Managed Care – PPO | Attending: Nurse Practitioner | Admitting: Nurse Practitioner

## 2020-09-03 ENCOUNTER — Ambulatory Visit: Payer: BC Managed Care – PPO | Admitting: Nurse Practitioner

## 2020-09-03 VITALS — BP 106/76 | HR 88 | Temp 98.8°F | Resp 18 | Wt 180.0 lb

## 2020-09-03 DIAGNOSIS — R053 Chronic cough: Secondary | ICD-10-CM | POA: Insufficient documentation

## 2020-09-03 NOTE — Progress Notes (Signed)
   Subjective:    Patient ID: Joshua Cabrera, male    DOB: 1955-09-28, 65 y.o.   MRN: [id number]  HPI  See previous virtual visit. Patient has had a chronic cough for 6 weeks.   Has has pneumonia twice in the past  Denies foreign travel   Cough is dry mostly, worse in the evenings.   Tried sudafed, finished azithromycin and also has used benzonatate and OTC cough medication for relief   He is currently getting allergy injections, has been for > 6 months is in maintenance dosing.   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   Last visit with allergist was in August 2021  Review of Systems     Objective:   Physical Exam        Assessment & Plan:  Will send for Chest XRAY and follow up with results

## 2020-09-04 ENCOUNTER — Telehealth: Payer: Self-pay | Admitting: Nurse Practitioner

## 2020-09-04 DIAGNOSIS — R059 Cough, unspecified: Secondary | ICD-10-CM

## 2020-09-04 MED ORDER — ALBUTEROL SULFATE HFA 108 (90 BASE) MCG/ACT IN AERS: 2.0000 | INHALATION_SPRAY | Freq: Four times a day (QID) | RESPIRATORY_TRACT | 2 refills | Status: DC | PRN

## 2020-09-04 MED ORDER — MONTELUKAST SODIUM 10 MG PO TABS: 10.0000 mg | ORAL_TABLET | Freq: Every day | ORAL | 3 refills | Status: DC

## 2020-09-09 NOTE — Telephone Encounter (Signed)
  See previous office visit.  Patient was initially seen for chronic cough present for 6 weeks.   Has a history of known allergies to dogs/environment and has been seeing [facility] for alelrgy injections. He is currently on a maintenance dosage without SEs. Aside from taking Claritin the day of his injection he takes no other oral antihistamines.   He does use Flonase.   His cough has been mainly dry, denies a history of COVID or other viral symptoms systemically  Initially treated with Zpack and Benzonatate cough medication without improvement.   He denies risk factors including: negative history of tobacco use, no foreign travel, no heightened risk for TB exposure, Adult TDAP unknown-   XRAY after last visit was unremarkable and exam was stable without wheezing/SOB or other lung abnormalities on exam.   Dry cough that persists and is exacerbated by conversation present.   After consultation in office and confirmation with XRAY that no underlying infectious process is involved will continue to treat this as an asthmatic cough secondary to allergies.   Advised Singulair and Albuterol inhaler, may continue benzonatate as needed.   Will hold off on oral or inhaled steroids until consultation with Allergist is completed  Fax notes to [place] location of [facility]  Patient to schedule follow up with Allergist MD prior to next injection to discuss cough and treatment plan.   Patient to [facility] with any SEs from new medications, new symptoms, or if no improvement after 10 days.    Narrative & Impression  CLINICAL DATA:  Chronic cough  EXAM: CHEST - 2 VIEW  COMPARISON:  04/12/2011  FINDINGS: The heart size and mediastinal contours are within normal limits. Both lungs are clear. No pleural effusion or pneumothorax. No acute osseous abnormality.  IMPRESSION: No acute process in the chest.   Electronically Signed   By: Macy Mis M.D.   On: 09/04/2020 14:33     Meds ordered this encounter  Medications  . montelukast (SINGULAIR) 10 MG tablet    Sig: Take 1 tablet (10 mg total) by mouth at bedtime.    Dispense:  30 tablet    Refill:  3  . albuterol (VENTOLIN HFA) 108 (90 Base) MCG/ACT inhaler    Sig: Inhale 2 puffs into the lungs every 6 (six) hours as needed (cough, rinse mouth after use).    Dispense:  8 g    Refill:  2    Please dispense with spacer if available

## 2020-11-10 ENCOUNTER — Inpatient Hospital Stay: Payer: BC Managed Care – PPO | Attending: Internal Medicine

## 2020-11-10 ENCOUNTER — Other Ambulatory Visit: Payer: Self-pay

## 2020-11-10 DIAGNOSIS — D473 Essential (hemorrhagic) thrombocythemia: Secondary | ICD-10-CM | POA: Insufficient documentation

## 2020-11-10 LAB — CBC WITH DIFFERENTIAL/PLATELET
Abs Immature Granulocytes: 0.02 10*3/uL (ref 0.00–0.07)
Basophils Absolute: 0.1 10*3/uL (ref 0.0–0.1)
Basophils Relative: 2 %
Eosinophils Absolute: 0.1 10*3/uL (ref 0.0–0.5)
Eosinophils Relative: 2 %
HCT: 41.3 % (ref 39.0–52.0)
Hemoglobin: 14 g/dL (ref 13.0–17.0)
Immature Granulocytes: 0 %
Lymphocytes Relative: 22 %
Lymphs Abs: 1.7 10*3/uL (ref 0.7–4.0)
MCH: 34.9 pg — ABNORMAL HIGH (ref 26.0–34.0)
MCHC: 33.9 g/dL (ref 30.0–36.0)
MCV: 103 fL — ABNORMAL HIGH (ref 80.0–100.0)
Monocytes Absolute: 0.9 10*3/uL (ref 0.1–1.0)
Monocytes Relative: 12 %
Neutro Abs: 4.9 10*3/uL (ref 1.7–7.7)
Neutrophils Relative %: 62 %
Platelets: 471 10*3/uL — ABNORMAL HIGH (ref 150–400)
RBC: 4.01 MIL/uL — ABNORMAL LOW (ref 4.22–5.81)
RDW: 11.6 % (ref 11.5–15.5)
WBC: 7.8 10*3/uL (ref 4.0–10.5)
nRBC: 0 % (ref 0.0–0.2)

## 2020-11-10 LAB — LACTATE DEHYDROGENASE: LDH: 123 U/L (ref 98–192)

## 2020-11-10 LAB — COMPREHENSIVE METABOLIC PANEL
ALT: 17 U/L (ref 0–44)
AST: 22 U/L (ref 15–41)
Albumin: 3.9 g/dL (ref 3.5–5.0)
Alkaline Phosphatase: 55 U/L (ref 38–126)
Anion gap: 10 (ref 5–15)
BUN: 15 mg/dL (ref 8–23)
CO2: 25 mmol/L (ref 22–32)
Calcium: 8.9 mg/dL (ref 8.9–10.3)
Chloride: 100 mmol/L (ref 98–111)
Creatinine, Ser: 0.86 mg/dL (ref 0.61–1.24)
GFR, Estimated: >60 mL/min (ref >60)
Glucose, Bld: 104 mg/dL — ABNORMAL HIGH (ref 70–99)
Potassium: 4.3 mmol/L (ref 3.5–5.1)
Sodium: 135 mmol/L (ref 135–145)
Total Bilirubin: 1.2 mg/dL (ref 0.3–1.2)
Total Protein: 7 g/dL (ref 6.5–8.1)

## 2020-12-01 DIAGNOSIS — M25551 Pain in right hip: Secondary | ICD-10-CM | POA: Insufficient documentation

## 2020-12-05 NOTE — Telephone Encounter (Signed)
Erroneous encounter

## 2020-12-25 ENCOUNTER — Telehealth: Payer: Self-pay | Admitting: *Deleted

## 2020-12-25 NOTE — Telephone Encounter (Signed)
Joshua Cabrera @ [facility] return my phone call. She understands that Dr. Jacinto Reap will not be back until may 23rd to complete this clearance form.

## 2020-12-25 NOTE — Telephone Encounter (Signed)
Incoming fax from [facility] for hematology clearance from Dr. Rogue Bussing prior to hip surgery.  Dr. B out of the office at the present time.  Left msg for [name]- surgery coordinator that md is out of the office and will not be able to sign the form until Dr. B returns..  I attempted to reach the patient to discuss the clearance. Unable to leave a vm. mychart msg sent to patient.

## 2021-01-07 NOTE — Telephone Encounter (Signed)
Surgical clearance form filled out and and faxed. Tried calling pt to let him know. No answer and unable to leave VM.

## 2021-02-24 NOTE — Progress Notes (Signed)
DUE TO COVID-19 ONLY ONE VISITOR IS ALLOWED TO COME WITH YOU AND STAY IN THE WAITING ROOM ONLY DURING PRE OP AND PROCEDURE DAY OF SURGERY. THE 1 VISITOR  MAY VISIT WITH YOU AFTER SURGERY IN YOUR PRIVATE ROOM DURING VISITING HOURS ONLY!  YOU NEED TO HAVE A COVID 19 TEST ON___7/25/2022 ____ @_______ , THIS TEST MUST BE DONE BEFORE SURGERY,  COVID TESTING SITE [address] [place] [id number], IT IS ON THE RIGHT GOING OUT [address] APPROXIMATELY  2 MINUTES PAST [employer] ON THE RIGHT. ONCE YOUR COVID TEST IS COMPLETED,  PLEASE BEGIN THE QUARANTINE INSTRUCTIONS AS OUTLINED IN YOUR HANDOUT.                Joshua Cabrera  02/24/2021   Your procedure is scheduled on:         03/11/2021   Report to [facility] Main  Entrance   Report to admitting at     [phone]     Call this number if you have problems the morning of surgery [phone]    REMEMBER: NO  SOLID FOOD CANDY OR GUM AFTER MIDNIGHT. CLEAR LIQUIDS UNTIL      0745am        . NOTHING BY MOUTH EXCEPT CLEAR LIQUIDS UNTIL    0745am   . PLEASE FINISH ENSURE DRINK PER SURGEON ORDER  WHICH NEEDS TO BE COMPLETED AT [id number]     .      CLEAR LIQUID DIET   Foods Allowed                                                                    Coffee and tea, regular and decaf                            Fruit ices (not with fruit pulp)                                      Iced Popsicles                                    Carbonated beverages, regular and diet                                    Cranberry, grape and apple juices Sports drinks like Gatorade Lightly seasoned clear broth or consume(fat free) Sugar, honey syrup ___________________________________________________________________      BRUSH YOUR TEETH MORNING OF SURGERY AND RINSE YOUR MOUTH OUT, NO CHEWING GUM CANDY OR MINTS.     Take these medicines the morning of surgery with A SIP OF WATER:        Claritin, inhalers as usual and bring  DO NOT TAKE  ANY DIABETIC MEDICATIONS DAY OF YOUR SURGERY                               You may not have any metal  on your body including hair pins and              piercings  Do not wear jewelry, make-up, lotions, powders or perfumes, deodorant             Do not wear nail polish on your fingernails.  Do not shave  48 hours prior to surgery.              Men may shave face and neck.   Do not bring valuables to the hospital. [place].  Contacts, dentures or bridgework may not be worn into surgery.  Leave suitcase in the car. After surgery it may be brought to your room.     Patients discharged the day of surgery will not be allowed to drive home. IF YOU ARE HAVING SURGERY AND GOING HOME THE SAME DAY, YOU MUST HAVE AN ADULT TO DRIVE YOU HOME AND BE WITH YOU FOR 24 HOURS. YOU MAY GO HOME BY TAXI OR UBER OR ORTHERWISE, BUT AN ADULT MUST ACCOMPANY YOU HOME AND STAY WITH YOU FOR 24 HOURS.  Name and phone number of your driver:  Special Instructions: N/A              Please read over the following fact sheets you were given: _____________________________________________________________________  [facility] - Preparing for Surgery Before surgery, you can play an important role.  Because skin is not sterile, your skin needs to be as free of germs as possible.  You can reduce the number of germs on your skin by washing with CHG (chlorahexidine gluconate) soap before surgery.  CHG is an antiseptic cleaner which kills germs and bonds with the skin to continue killing germs even after washing. Please DO NOT use if you have an allergy to CHG or antibacterial soaps.  If your skin becomes reddened/irritated stop using the CHG and inform your nurse when you arrive at Short Stay. Do not shave (including legs and underarms) for at least 48 hours prior to the first CHG shower.  You may shave your face/neck. Please follow these instructions carefully:  1.  Shower with CHG  Soap the night before surgery and the  morning of Surgery.  2.  If you choose to wash your hair, wash your hair first as usual with your  normal  shampoo.  3.  After you shampoo, rinse your hair and body thoroughly to remove the  shampoo.                           4.  Use CHG as you would any other liquid soap.  You can apply chg directly  to the skin and wash                       Gently with a scrungie or clean washcloth.  5.  Apply the CHG Soap to your body ONLY FROM THE NECK DOWN.   Do not use on face/ open                           Wound or open sores. Avoid contact with eyes, ears mouth and genitals (private parts).                       Wash face,  Genitals (private parts) with your normal soap.             6.  Wash thoroughly, paying special attention to the area where your surgery  will be performed.  7.  Thoroughly rinse your body with warm water from the neck down.  8.  DO NOT shower/wash with your normal soap after using and rinsing off  the CHG Soap.                9.  Pat yourself dry with a clean towel.            10.  Wear clean pajamas.            11.  Place clean sheets on your bed the night of your first shower and do not  sleep with pets. Day of Surgery : Do not apply any lotions/deodorants the morning of surgery.  Please wear clean clothes to the hospital/surgery center.  FAILURE TO FOLLOW THESE INSTRUCTIONS MAY RESULT IN THE CANCELLATION OF YOUR SURGERY PATIENT SIGNATURE_________________________________  NURSE SIGNATURE__________________________________  ________________________________________________________________________

## 2021-02-27 ENCOUNTER — Other Ambulatory Visit: Payer: Self-pay

## 2021-02-27 ENCOUNTER — Encounter (HOSPITAL_COMMUNITY): Payer: Self-pay

## 2021-02-27 ENCOUNTER — Encounter (HOSPITAL_COMMUNITY)
Admission: RE | Admit: 2021-02-27 | Discharge: 2021-02-27 | Disposition: A | Discharge status: Home / Self Care | Payer: BC Managed Care – PPO | Source: Ambulatory Visit | Attending: Orthopedic Surgery | Admitting: Orthopedic Surgery

## 2021-02-27 DIAGNOSIS — Z01812 Encounter for preprocedural laboratory examination: Secondary | ICD-10-CM | POA: Diagnosis present

## 2021-02-27 HISTORY — DX: Sleep apnea, unspecified: G47.30

## 2021-02-27 LAB — COMPREHENSIVE METABOLIC PANEL
ALT: 15 U/L (ref 0–44)
AST: 20 U/L (ref 15–41)
Albumin: 3.8 g/dL (ref 3.5–5.0)
Alkaline Phosphatase: 53 U/L (ref 38–126)
Anion gap: 6 (ref 5–15)
BUN: 18 mg/dL (ref 8–23)
CO2: 28 mmol/L (ref 22–32)
Calcium: 9.2 mg/dL (ref 8.9–10.3)
Chloride: 101 mmol/L (ref 98–111)
Creatinine, Ser: 0.84 mg/dL (ref 0.61–1.24)
GFR, Estimated: >60 mL/min (ref >60)
Glucose, Bld: 92 mg/dL (ref 70–99)
Potassium: 4.5 mmol/L (ref 3.5–5.1)
Sodium: 135 mmol/L (ref 135–145)
Total Bilirubin: 1.2 mg/dL (ref 0.3–1.2)
Total Protein: 7.1 g/dL (ref 6.5–8.1)

## 2021-02-27 LAB — SURGICAL PCR SCREEN
MRSA, PCR: NEGATIVE
Staphylococcus aureus: NEGATIVE

## 2021-02-27 LAB — TYPE AND SCREEN
ABO/RH(D): O POS
Antibody Screen: NEGATIVE

## 2021-02-27 LAB — CBC
HCT: 35.8 % — ABNORMAL LOW (ref 39.0–52.0)
Hemoglobin: 11.2 g/dL — ABNORMAL LOW (ref 13.0–17.0)
MCH: 29.9 pg (ref 26.0–34.0)
MCHC: 31.3 g/dL (ref 30.0–36.0)
MCV: 95.7 fL (ref 80.0–100.0)
Platelets: 540 10*3/uL — ABNORMAL HIGH (ref 150–400)
RBC: 3.74 MIL/uL — ABNORMAL LOW (ref 4.22–5.81)
RDW: 13.8 % (ref 11.5–15.5)
WBC: 6.8 10*3/uL (ref 4.0–10.5)
nRBC: 0 % (ref 0.0–0.2)

## 2021-02-27 LAB — PROTIME-INR
INR: 0.9 (ref 0.8–1.2)
Prothrombin Time: 12.6 seconds (ref 11.4–15.2)

## 2021-02-27 NOTE — Progress Notes (Addendum)
Anesthesia Review:  PCP: DR Loney Hering- with [facility] 06/27/20 [facility]  Cardiologist : none  Hematologist- Dr Rogue Bussing LOV - 05/12/20 - for thrombocytosis  Chest x-ray :09/04/20  EKG : Echo : Stress test: Cardiac Cath :  Activity level: can do a flight of stairs without difficulty  Sleep Study/ CPAP :has cpap  Fasting Blood Sugar :      / Checks Blood Sugar -- times a day:   Blood Thinner/ Instructions /Last Dose: ASA / Instructions/ Last Dose :   81 mg Aspirin- stop 7 days prior per pt  Rectal bleeding OVwith Dr Alice Reichert- 02/17/21  flexible sigmoid to be scheduled  Has a vasovagal response with blood draws - has to be in a recliner and reverse in recliner

## 2021-03-09 ENCOUNTER — Other Ambulatory Visit: Payer: Self-pay

## 2021-03-09 ENCOUNTER — Other Ambulatory Visit
Admission: RE | Admit: 2021-03-09 | Discharge: 2021-03-09 | Disposition: A | Discharge status: Home / Self Care | Payer: BC Managed Care – PPO | Source: Ambulatory Visit | Attending: Orthopedic Surgery | Admitting: Orthopedic Surgery

## 2021-03-09 DIAGNOSIS — Z20822 Contact with and (suspected) exposure to covid-19: Secondary | ICD-10-CM | POA: Insufficient documentation

## 2021-03-09 DIAGNOSIS — Z01812 Encounter for preprocedural laboratory examination: Secondary | ICD-10-CM | POA: Insufficient documentation

## 2021-03-09 LAB — SARS CORONAVIRUS 2 (TAT 6-24 HRS): SARS Coronavirus 2: NEGATIVE

## 2021-03-10 NOTE — H&P (Signed)
TOTAL HIP ADMISSION H&P  Patient is admitted for right total hip arthroplasty.  Subjective:  Chief Complaint: Right hip pain  HPI: Joshua Cabrera, 65 y.o. male, has a history of pain and functional disability in the right hip due to arthritis and patient has failed non-surgical conservative treatments for greater than 12 weeks to include NSAID's and/or analgesics and activity modification. Onset of symptoms was gradual, starting  several  years ago with gradually worsening course since that time. The patient noted no past surgery on the right hip. Patient currently rates pain in the right hip at 7 out of 10 with activity. Patient has worsening of pain with activity and weight bearing and pain that interfers with activities of daily living. Patient has evidence of periarticular osteophytes and joint space narrowing by imaging studies. There is no current active infection.  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   Arthritis    Complication of anesthesia    GERD (gastroesophageal reflux disease)    Pneumonia    PONV (postoperative nausea and vomiting)    Sleep apnea    Thrombocytosis    Unspecified diseases of blood and blood-forming organs    "essential hyperthrombocytosis"    Past Surgical History:  Procedure Laterality Date   BACK SURGERY  June 2015   ROTATOR CUFF REPAIR     x3(lt x2), rt. x1   TOTAL HIP ARTHROPLASTY Left 02/11/2014   Procedure: LEFT TOTAL HIP ARTHROPLASTY ANTERIOR APPROACH;  Surgeon: Gearlean Alf, MD;  Location: [facility] ORS;  Service: Orthopedics;  Laterality: Left;    Prior to Admission medications   Medication  Sig Start Date End Date Taking? Authorizing Provider  aspirin 81 MG tablet Take 81 mg by mouth daily.   Yes [provider]  EPINEPHrine 0.3 mg/0.3 mL IJ SOAJ injection Inject 0.3 mg into the muscle as needed for anaphylaxis. 04/17/18  Yes [provider]  fluticasone (FLONASE) 50 MCG/ACT nasal spray Place 1 spray into both nostrils daily.   Yes [provider]  hydroxyurea (HYDREA) 500 MG capsule TAKE TWO CAPSULES DAILY ON MONDAYS, [place], AND FRIDAYS. TAKE ONLY ONE CAPSULE ON OTHER DAYS OF THE WEEK Patient taking differently: Take 500 mg by mouth See admin instructions. Take 500 mg by mouth daily and on Monday, Wednesday and Friday take 500 mg twice daily 04/11/20  Yes Cammie Sickle, MD  ibuprofen (ADVIL) 200 MG tablet Take 400-600 mg by mouth in the morning.   Yes [provider]  loratadine (CLARITIN) 10 MG tablet Take 10 mg by mouth daily.   Yes [provider]  Multiple Vitamin (MULTIVITAMIN WITH MINERALS) TABS tablet Take 1 tablet by mouth daily.   Yes [provider]  sildenafil (REVATIO) 20 MG tablet Take 60 mg by mouth daily as needed (ED).   Yes [provider]  albuterol (VENTOLIN HFA) 108 (90 Base) MCG/ACT inhaler Inhale 2 puffs into the lungs every 6 (six) hours as needed (cough, rinse mouth after use). Patient not taking: No sig reported 09/04/20   Apolonio Schneiders, FNP  montelukast (SINGULAIR) 10 MG tablet Take 1 tablet (10 mg total) by mouth at bedtime. Patient not taking:  Reported on 02/25/2021 09/04/20   Apolonio Schneiders, FNP    Allergies  Allergen Reactions   Hornet Venom Anaphylaxis   Wasp Venom Protein Anaphylaxis   Vicodin [Hydrocodone-Acetaminophen] Nausea And Vomiting   Other Other (See Comments) and Swelling    Numbness and tingling in arms and hands.    Penicillins Rash    Social History   Socioeconomic History   Marital status: Married    Spouse name: Not on file   Number of children: Not on file    Years of education: Not on file   Highest education level: Not on file  Occupational History   Not on file  Tobacco Use   Smoking status: Never   Smokeless tobacco: Never  Vaping Use   Vaping Use: Never used  Substance and Sexual Activity   Alcohol use: Yes    Alcohol/week: 2.0 standard drinks    Types: 2 Cans of beer per week    Comment: beer/ wine 2glasses  per night   Drug use: No   Sexual activity: Yes  Other Topics Concern   Not on file  Social History Narrative   ** Merged History Encounter **       Social Determinants of Health   Financial Resource Strain: Not on file  Food Insecurity: Not on file  Transportation Needs: Not on file  Physical Activity: Not on file  Stress: Not on file  Social Connections: Not on file  Intimate Partner Violence: Not on file    Tobacco Use: Low Risk    Smoking Tobacco Use: Never   Smokeless Tobacco Use: Never   Social History   Substance and Sexual Activity  Alcohol Use Yes   Alcohol/week: 2.0 standard drinks   Types: 2 Cans of beer per week   Comment: beer/ wine 2glasses  per night    Family History  Problem Relation Age of Onset   Cancer Maternal Aunt    Cancer Paternal Aunt    Stroke Maternal Grandfather     ROS: Constitutional: no fever, no chills, no night sweats, no significant weight loss Cardiovascular: no chest pain, no palpitations Respiratory: no cough, no shortness of breath, No COPD Gastrointestinal: no vomiting, no nausea Musculoskeletal: no swelling in Joints, Joint Pain Neurologic: no numbness, no tingling, no difficulty with balance    Objective:  Physical Exam: Well nourished and well developed.  General: Alert and oriented x3, cooperative and pleasant, no acute distress.  Head: normocephalic, atraumatic, neck supple.  Eyes: EOMI.  Respiratory: breath sounds clear in all fields, no wheezing, rales, or rhonchi. Cardiovascular: Regular rate and rhythm, no murmurs, gallops or rubs.   Abdomen: non-tender to palpation and soft, normoactive bowel sounds. Musculoskeletal:  Antalgic gait pattern favoring the right side without the use of assisted devices.     Right Hip Exam:   ROM: Flexion to 100, Internal Rotation minimal, External Rotation 20-30, and abduction 30 without discomfort.   There is no tenderness over the greater trochanter.       Left Hip Exam:   ROM: Flexion to 120, Internal Rotation 30, External Rotation 40, and abduction 40 without discomfort.   There is no tenderness over the greater trochanter.   Calves soft and nontender. Motor function intact in LE. Strength 5/5 LE bilaterally. Neuro: Distal pulses 2+. Sensation to light touch intact in LE.    Vital signs in last 24 hours:    Imaging Review  Radiographs- AP pelvis, AP and lateral of the right hip dated 12/02/2019 demonstrate  bone-on-bone arthritis with osteophyte formation on the femoral head-neck junction.  Assessment/Plan:  End stage arthritis, right hip  The patient history, physical examination, clinical judgement of the provider and imaging studies are consistent with end stage degenerative joint disease of the right hip and total hip arthroplasty is deemed medically necessary. The treatment options including medical management, injection therapy, arthroscopy and arthroplasty were discussed at length. The risks and benefits of total hip arthroplasty were presented and reviewed. The risks due to aseptic loosening, infection, stiffness, dislocation/subluxation, thromboembolic complications and other imponderables were discussed. The patient acknowledged the explanation, agreed to proceed with the plan and consent was signed. Patient is being admitted for inpatient treatment for surgery, pain control, PT, OT, prophylactic antibiotics, VTE prophylaxis, progressive ambulation and ADLs and discharge planning.The patient is planning to be discharged  home .   Patient's anticipated LOS is less than  2 midnights, meeting these requirements: - Younger than 63 - Lives within 1 hour of care - Has a competent adult at home to recover with post-op recover - NO history of  - Chronic pain requiring opiods  - Diabetes  - Coronary Artery Disease  - Heart failure  - Heart attack  - Stroke  - DVT/VTE  - Cardiac arrhythmia  - Respiratory Failure/COPD  - Renal failure  - Anemia  - Advanced Liver disease   Therapy Plans: HEP Disposition: Home with wife. Planned DVT Prophylaxis: Xarelto '10mg'$  (Significant family history of strokes; Essential thrombocytosis) DME Needed: Vassie Moselle PCP: Derinda Late, MD (clearance received) TXA: IV Allergies: PCN (rash) Anesthesia Concerns: Has had general anesthesia in the past for his shoulder surgeries, and struggled with it. Last hip replacement he had a spinal and did well.  BMI: 26 Last HgbA1c: n/a  Pharmacy: [facility] on [address] in [place]  Other: Strong family history of strokes. Patient has essential thrombocytosis.     - Patient was instructed on what medications to stop prior to surgery. - Follow-up visit in 2 weeks with Dr. Wynelle Link - Begin physical therapy following surgery - Pre-operative lab work as pre-surgical testing - Prescriptions will be provided in hospital at time of discharge  Fenton Foy, [facility], PA-C Orthopedic Surgery [facility] [place]

## 2021-03-11 ENCOUNTER — Encounter (HOSPITAL_COMMUNITY)
Admission: RE | Disposition: A | Discharge status: Home / Self Care | Payer: Self-pay | Source: Home / Self Care | Attending: Orthopedic Surgery

## 2021-03-11 ENCOUNTER — Observation Stay (HOSPITAL_COMMUNITY)
Admission: RE | Admit: 2021-03-11 | Discharge: 2021-03-13 | Disposition: A | Discharge status: Home / Self Care | Payer: BC Managed Care – PPO | Attending: Orthopedic Surgery | Admitting: Orthopedic Surgery

## 2021-03-11 ENCOUNTER — Ambulatory Visit (HOSPITAL_COMMUNITY): Payer: BC Managed Care – PPO | Admitting: Anesthesiology

## 2021-03-11 ENCOUNTER — Ambulatory Visit (HOSPITAL_COMMUNITY): Payer: BC Managed Care – PPO | Admitting: Physician Assistant

## 2021-03-11 ENCOUNTER — Observation Stay (HOSPITAL_COMMUNITY): Payer: BC Managed Care – PPO

## 2021-03-11 ENCOUNTER — Encounter (HOSPITAL_COMMUNITY): Payer: Self-pay | Admitting: Orthopedic Surgery

## 2021-03-11 ENCOUNTER — Ambulatory Visit (HOSPITAL_COMMUNITY): Payer: BC Managed Care – PPO

## 2021-03-11 ENCOUNTER — Other Ambulatory Visit: Payer: Self-pay | Admitting: Internal Medicine

## 2021-03-11 ENCOUNTER — Other Ambulatory Visit: Payer: Self-pay

## 2021-03-11 DIAGNOSIS — Z419 Encounter for procedure for purposes other than remedying health state, unspecified: Secondary | ICD-10-CM

## 2021-03-11 DIAGNOSIS — Z7982 Long term (current) use of aspirin: Secondary | ICD-10-CM | POA: Insufficient documentation

## 2021-03-11 DIAGNOSIS — D473 Essential (hemorrhagic) thrombocythemia: Secondary | ICD-10-CM

## 2021-03-11 DIAGNOSIS — M1611 Unilateral primary osteoarthritis, right hip: Secondary | ICD-10-CM | POA: Diagnosis present

## 2021-03-11 DIAGNOSIS — Z96642 Presence of left artificial hip joint: Secondary | ICD-10-CM | POA: Diagnosis not present

## 2021-03-11 DIAGNOSIS — Z96649 Presence of unspecified artificial hip joint: Secondary | ICD-10-CM

## 2021-03-11 HISTORY — PX: TOTAL HIP ARTHROPLASTY: SHX124

## 2021-03-11 SURGERY — ARTHROPLASTY, HIP, TOTAL, ANTERIOR APPROACH
Anesthesia: Spinal | Site: Hip | Laterality: Right

## 2021-03-11 MED ORDER — SODIUM CHLORIDE 0.9 % IV SOLN
INTRAVENOUS | Status: AC
  Filled 2021-03-11: qty 2

## 2021-03-11 MED ORDER — ACETAMINOPHEN 10 MG/ML IV SOLN
1000.0000 mg | Freq: Four times a day (QID) | INTRAVENOUS | Status: DC
  Administered 2021-03-11: 1000 mg via INTRAVENOUS
  Filled 2021-03-11: qty 100

## 2021-03-11 MED ORDER — WATER FOR IRRIGATION, STERILE IR SOLN
Status: DC | PRN
  Administered 2021-03-11: 2000 mL

## 2021-03-11 MED ORDER — PROPOFOL 500 MG/50ML IV EMUL
INTRAVENOUS | Status: DC | PRN
  Administered 2021-03-11: 75 ug/kg/min via INTRAVENOUS

## 2021-03-11 MED ORDER — BUPIVACAINE IN DEXTROSE 0.75-8.25 % IT SOLN
INTRATHECAL | Status: DC | PRN
  Administered 2021-03-11: 2 mL via INTRATHECAL

## 2021-03-11 MED ORDER — OXYCODONE HCL 5 MG PO TABS
ORAL_TABLET | ORAL | Status: AC
  Filled 2021-03-11: qty 1

## 2021-03-11 MED ORDER — BISACODYL 10 MG RE SUPP: 10.0000 mg | Freq: Every day | RECTAL | Status: DC | PRN

## 2021-03-11 MED ORDER — PHENOL 1.4 % MT LIQD
1.0000 | OROMUCOSAL | Status: DC | PRN
  Filled 2021-03-11: qty 177

## 2021-03-11 MED ORDER — DEXAMETHASONE SODIUM PHOSPHATE 10 MG/ML IJ SOLN
INTRAMUSCULAR | Status: AC
  Filled 2021-03-11: qty 1

## 2021-03-11 MED ORDER — HYDROXYUREA 500 MG PO CAPS
500.0000 mg | ORAL_CAPSULE | ORAL | Status: DC
  Administered 2021-03-11 – 2021-03-13 (×2): 500 mg via ORAL
  Filled 2021-03-11 (×2): qty 1

## 2021-03-11 MED ORDER — MORPHINE SULFATE (PF) 4 MG/ML IV SOLN: 0.5000 mg | INTRAVENOUS | Status: DC | PRN

## 2021-03-11 MED ORDER — PROPOFOL 10 MG/ML IV BOLUS
INTRAVENOUS | Status: AC
  Filled 2021-03-11: qty 20

## 2021-03-11 MED ORDER — CEFAZOLIN SODIUM 2 G IJ SOLR
2.0000 g | Freq: Four times a day (QID) | INTRAMUSCULAR | Status: AC
  Administered 2021-03-11 (×2): 2 g via INTRAVENOUS
  Filled 2021-03-11: qty 2

## 2021-03-11 MED ORDER — OXYCODONE HCL 5 MG PO TABS
5.0000 mg | ORAL_TABLET | ORAL | Status: DC | PRN
  Administered 2021-03-11: 5 mg via ORAL
  Filled 2021-03-11: qty 1

## 2021-03-11 MED ORDER — MIDAZOLAM HCL 5 MG/5ML IJ SOLN
INTRAMUSCULAR | Status: DC | PRN
  Administered 2021-03-11: 2 mg via INTRAVENOUS

## 2021-03-11 MED ORDER — TRANEXAMIC ACID-NACL 1000-0.7 MG/100ML-% IV SOLN
1000.0000 mg | INTRAVENOUS | Status: AC
  Administered 2021-03-11: 1000 mg via INTRAVENOUS
  Filled 2021-03-11: qty 100

## 2021-03-11 MED ORDER — SODIUM CHLORIDE 0.9 % IV SOLN
2.0000 g | INTRAVENOUS | Status: AC
  Administered 2021-03-11: 2 g via INTRAVENOUS
  Filled 2021-03-11: qty 2

## 2021-03-11 MED ORDER — METOCLOPRAMIDE HCL 5 MG PO TABS
5.0000 mg | ORAL_TABLET | Freq: Three times a day (TID) | ORAL | Status: DC | PRN
  Administered 2021-03-11: 5 mg via ORAL
  Administered 2021-03-13: 10 mg via ORAL
  Filled 2021-03-11: qty 2
  Filled 2021-03-11: qty 1
  Filled 2021-03-11 (×2): qty 2

## 2021-03-11 MED ORDER — PROPOFOL 1000 MG/100ML IV EMUL
INTRAVENOUS | Status: AC
  Filled 2021-03-11: qty 100

## 2021-03-11 MED ORDER — FENTANYL CITRATE (PF) 100 MCG/2ML IJ SOLN
INTRAMUSCULAR | Status: DC | PRN
  Administered 2021-03-11: 50 ug via INTRAVENOUS

## 2021-03-11 MED ORDER — EPHEDRINE SULFATE-NACL 50-0.9 MG/10ML-% IV SOSY
PREFILLED_SYRINGE | INTRAVENOUS | Status: DC | PRN
  Administered 2021-03-11: 5 mg via INTRAVENOUS

## 2021-03-11 MED ORDER — DEXAMETHASONE SODIUM PHOSPHATE 10 MG/ML IJ SOLN
8.0000 mg | Freq: Once | INTRAMUSCULAR | Status: AC
  Administered 2021-03-11: 8 mg via INTRAVENOUS

## 2021-03-11 MED ORDER — MIDAZOLAM HCL 2 MG/2ML IJ SOLN
INTRAMUSCULAR | Status: AC
  Filled 2021-03-11: qty 2

## 2021-03-11 MED ORDER — METHOCARBAMOL 500 MG PO TABS
500.0000 mg | ORAL_TABLET | Freq: Four times a day (QID) | ORAL | Status: DC | PRN
  Administered 2021-03-11 – 2021-03-12 (×4): 500 mg via ORAL
  Filled 2021-03-11 (×4): qty 1

## 2021-03-11 MED ORDER — FENTANYL CITRATE (PF) 100 MCG/2ML IJ SOLN
INTRAMUSCULAR | Status: AC
  Filled 2021-03-11: qty 2

## 2021-03-11 MED ORDER — ONDANSETRON HCL 4 MG/2ML IJ SOLN
INTRAMUSCULAR | Status: DC | PRN
  Administered 2021-03-11: 4 mg via INTRAVENOUS

## 2021-03-11 MED ORDER — SUCCINYLCHOLINE CHLORIDE 200 MG/10ML IV SOSY
PREFILLED_SYRINGE | INTRAVENOUS | Status: AC
  Filled 2021-03-11: qty 10

## 2021-03-11 MED ORDER — METOCLOPRAMIDE HCL 5 MG/ML IJ SOLN
5.0000 mg | Freq: Three times a day (TID) | INTRAMUSCULAR | Status: DC | PRN
  Administered 2021-03-12 (×2): 10 mg via INTRAVENOUS
  Filled 2021-03-11 (×2): qty 2

## 2021-03-11 MED ORDER — ACETAMINOPHEN 325 MG PO TABS: 325.0000 mg | ORAL_TABLET | Freq: Four times a day (QID) | ORAL | Status: DC | PRN

## 2021-03-11 MED ORDER — METHOCARBAMOL 500 MG IVPB - SIMPLE MED
500.0000 mg | Freq: Four times a day (QID) | INTRAVENOUS | Status: DC | PRN
Start: 2021-03-11 — End: 2021-03-13
  Filled 2021-03-11: qty 50

## 2021-03-11 MED ORDER — PROPOFOL 10 MG/ML IV BOLUS
INTRAVENOUS | Status: DC | PRN
  Administered 2021-03-11: 30 mg via INTRAVENOUS

## 2021-03-11 MED ORDER — HYDROXYUREA 500 MG PO CAPS: 500.0000 mg | ORAL_CAPSULE | ORAL | Status: DC

## 2021-03-11 MED ORDER — 0.9 % SODIUM CHLORIDE (POUR BTL) OPTIME
TOPICAL | Status: DC | PRN
  Administered 2021-03-11: 1000 mL

## 2021-03-11 MED ORDER — DEXAMETHASONE SODIUM PHOSPHATE 10 MG/ML IJ SOLN
10.0000 mg | Freq: Once | INTRAMUSCULAR | Status: AC
  Administered 2021-03-12: 10 mg via INTRAVENOUS
  Filled 2021-03-11: qty 1

## 2021-03-11 MED ORDER — TRAMADOL HCL 50 MG PO TABS
50.0000 mg | ORAL_TABLET | Freq: Four times a day (QID) | ORAL | Status: DC | PRN
  Administered 2021-03-11 – 2021-03-12 (×3): 100 mg via ORAL
  Administered 2021-03-12: 50 mg via ORAL
  Administered 2021-03-12: 100 mg via ORAL
  Administered 2021-03-13: 50 mg via ORAL
  Filled 2021-03-11: qty 1
  Filled 2021-03-11 (×6): qty 2

## 2021-03-11 MED ORDER — PHENYLEPHRINE HCL-NACL 10-0.9 MG/250ML-% IV SOLN
INTRAVENOUS | Status: DC | PRN
  Administered 2021-03-11: 40 ug/min via INTRAVENOUS

## 2021-03-11 MED ORDER — BUPIVACAINE HCL 0.25 % IJ SOLN
INTRAMUSCULAR | Status: DC | PRN
  Administered 2021-03-11: 30 mL

## 2021-03-11 MED ORDER — MAGNESIUM CITRATE PO SOLN: 1.0000 | Freq: Once | ORAL | Status: DC | PRN

## 2021-03-11 MED ORDER — LACTATED RINGERS IV SOLN: INTRAVENOUS | Status: DC

## 2021-03-11 MED ORDER — AMISULPRIDE (ANTIEMETIC) 5 MG/2ML IV SOLN: 10.0000 mg | Freq: Once | INTRAVENOUS | Status: DC | PRN

## 2021-03-11 MED ORDER — DOCUSATE SODIUM 100 MG PO CAPS
100.0000 mg | ORAL_CAPSULE | Freq: Two times a day (BID) | ORAL | Status: DC
  Administered 2021-03-11 – 2021-03-13 (×4): 100 mg via ORAL
  Filled 2021-03-11 (×4): qty 1

## 2021-03-11 MED ORDER — POLYETHYLENE GLYCOL 3350 17 G PO PACK: 17.0000 g | PACK | Freq: Every day | ORAL | Status: DC | PRN

## 2021-03-11 MED ORDER — BUPIVACAINE HCL (PF) 0.25 % IJ SOLN
INTRAMUSCULAR | Status: AC
  Filled 2021-03-11: qty 30

## 2021-03-11 MED ORDER — POVIDONE-IODINE 10 % EX SWAB
2.0000 "application " | Freq: Once | CUTANEOUS | Status: AC
  Administered 2021-03-11: 2 via TOPICAL

## 2021-03-11 MED ORDER — HYDROXYUREA 500 MG PO CAPS
500.0000 mg | ORAL_CAPSULE | ORAL | Status: DC
  Administered 2021-03-12: 500 mg via ORAL
  Filled 2021-03-11: qty 1

## 2021-03-11 MED ORDER — MENTHOL 3 MG MT LOZG
1.0000 | LOZENGE | OROMUCOSAL | Status: DC | PRN
  Filled 2021-03-11: qty 9

## 2021-03-11 MED ORDER — RIVAROXABAN 10 MG PO TABS
10.0000 mg | ORAL_TABLET | Freq: Every day | ORAL | Status: DC
  Administered 2021-03-12 – 2021-03-13 (×2): 10 mg via ORAL
  Filled 2021-03-11 (×2): qty 1

## 2021-03-11 MED ORDER — PHENYLEPHRINE HCL-NACL 10-0.9 MG/250ML-% IV SOLN
INTRAVENOUS | Status: AC
  Filled 2021-03-11: qty 500

## 2021-03-11 MED ORDER — SODIUM CHLORIDE 0.9 % IV SOLN: INTRAVENOUS | Status: DC

## 2021-03-11 MED ORDER — ONDANSETRON HCL 4 MG PO TABS
4.0000 mg | ORAL_TABLET | Freq: Four times a day (QID) | ORAL | Status: DC | PRN
  Filled 2021-03-11: qty 1

## 2021-03-11 MED ORDER — HYDROXYUREA 500 MG PO CAPS
1000.0000 mg | ORAL_CAPSULE | ORAL | Status: DC
Start: 2021-03-13 — End: 2021-03-11

## 2021-03-11 MED ORDER — ONDANSETRON HCL 4 MG/2ML IJ SOLN
4.0000 mg | Freq: Four times a day (QID) | INTRAMUSCULAR | Status: DC | PRN
  Administered 2021-03-11: 4 mg via INTRAVENOUS
  Filled 2021-03-11: qty 2

## 2021-03-11 MED ORDER — FENTANYL CITRATE (PF) 100 MCG/2ML IJ SOLN: 25.0000 ug | INTRAMUSCULAR | Status: DC | PRN

## 2021-03-11 MED ORDER — ONDANSETRON HCL 4 MG/2ML IJ SOLN
INTRAMUSCULAR | Status: AC
  Filled 2021-03-11: qty 2

## 2021-03-11 MED ORDER — METHOCARBAMOL 500 MG IVPB - SIMPLE MED
INTRAVENOUS | Status: AC
  Filled 2021-03-11: qty 50

## 2021-03-11 SURGICAL SUPPLY — 41 items
BAG COUNTER SPONGE SURGICOUNT (BAG) ×2 IMPLANT
BAG DECANTER FOR FLEXI CONT (MISCELLANEOUS) IMPLANT
BAG ZIPLOCK 12X15 (MISCELLANEOUS) IMPLANT
BLADE SAG 18X100X1.27 (BLADE) ×2 IMPLANT
COVER PERINEAL POST (MISCELLANEOUS) ×2 IMPLANT
COVER SURGICAL LIGHT HANDLE (MISCELLANEOUS) ×2 IMPLANT
CUP ACETBLR 54 OD PINNACLE (Hips) ×2 IMPLANT
DECANTER SPIKE VIAL GLASS SM (MISCELLANEOUS) IMPLANT
DRAPE FOOT SWITCH (DRAPES) ×2 IMPLANT
DRAPE STERI IOBAN 125X83 (DRAPES) ×2 IMPLANT
DRAPE U-SHAPE 47X51 STRL (DRAPES) ×4 IMPLANT
DRSG AQUACEL AG ADV 3.5X10 (GAUZE/BANDAGES/DRESSINGS) ×2 IMPLANT
DURAPREP 26ML APPLICATOR (WOUND CARE) ×2 IMPLANT
ELECT REM PT RETURN 15FT ADLT (MISCELLANEOUS) ×2 IMPLANT
GLOVE SRG 8 PF TXTR STRL LF DI (GLOVE) ×1 IMPLANT
GLOVE SURG ENC MOIS LTX SZ6.5 (GLOVE) ×2 IMPLANT
GLOVE SURG ENC MOIS LTX SZ7 (GLOVE) ×2 IMPLANT
GLOVE SURG ENC MOIS LTX SZ8 (GLOVE) ×4 IMPLANT
GLOVE SURG UNDER POLY LF SZ7 (GLOVE) ×2 IMPLANT
GLOVE SURG UNDER POLY LF SZ8 (GLOVE) ×1
GLOVE SURG UNDER POLY LF SZ8.5 (GLOVE) IMPLANT
GOWN STRL REUS W/TWL LRG LVL3 (GOWN DISPOSABLE) ×4 IMPLANT
GOWN STRL REUS W/TWL XL LVL3 (GOWN DISPOSABLE) IMPLANT
HEAD CERAMIC 36 PLUS5 (Hips) ×2 IMPLANT
HOLDER FOLEY CATH W/STRAP (MISCELLANEOUS) ×2 IMPLANT
KIT TURNOVER KIT A (KITS) ×2 IMPLANT
LINER MARATHON NEUT +4X54X36 (Hips) ×2 IMPLANT
MANIFOLD NEPTUNE II (INSTRUMENTS) ×2 IMPLANT
PACK ANTERIOR HIP CUSTOM (KITS) ×2 IMPLANT
PENCIL SMOKE EVACUATOR COATED (MISCELLANEOUS) ×2 IMPLANT
STEM FEM ACTIS HIGH SZ2 (Stem) ×2 IMPLANT
STRIP CLOSURE SKIN 1/2X4 (GAUZE/BANDAGES/DRESSINGS) ×2 IMPLANT
SUT ETHIBOND NAB CT1 #1 30IN (SUTURE) ×2 IMPLANT
SUT MNCRL AB 4-0 PS2 18 (SUTURE) ×2 IMPLANT
SUT STRATAFIX 0 PDS 27 VIOLET (SUTURE) ×2
SUT VIC AB 2-0 CT1 27 (SUTURE) ×2
SUT VIC AB 2-0 CT1 TAPERPNT 27 (SUTURE) ×2 IMPLANT
SUTURE STRATFX 0 PDS 27 VIOLET (SUTURE) ×1 IMPLANT
SYR 50ML LL SCALE MARK (SYRINGE) IMPLANT
TRAY FOLEY MTR SLVR 16FR STAT (SET/KITS/TRAYS/PACK) ×2 IMPLANT
TUBE SUCTION HIGH CAP CLEAR NV (SUCTIONS) ×2 IMPLANT

## 2021-03-11 NOTE — Op Note (Signed)
OPERATIVE REPORT- TOTAL HIP ARTHROPLASTY   PREOPERATIVE DIAGNOSIS: Osteoarthritis of the Right hip.   POSTOPERATIVE DIAGNOSIS: Osteoarthritis of the Right  hip.   PROCEDURE: Right total hip arthroplasty, anterior approach.   SURGEON: Gaynelle Arabian, MD   ASSISTANT: Theresa Duty, PA-C  ANESTHESIA:  Spinal  ESTIMATED BLOOD LOSS:-600 mL    DRAINS: Hemovac x1.   COMPLICATIONS: None   CONDITION: PACU - hemodynamically stable.   BRIEF CLINICAL NOTE: Joshua Cabrera is a 65 y.o. male who has advanced end-  stage arthritis of their Right  hip with progressively worsening pain and  dysfunction.The patient has failed nonoperative management and presents for  total hip arthroplasty.   PROCEDURE IN DETAIL: After successful administration of spinal  anesthetic, the traction boots for the [facility] bed were placed on both  feet and the patient was placed onto the [facility] bed, boots placed into the leg  holders. The Right hip was then isolated from the perineum with plastic  drapes and prepped and draped in the usual sterile fashion. ASIS and  greater trochanter were marked and a oblique incision was made, starting  at about 1 cm lateral and 2 cm distal to the ASIS and coursing towards  the anterior cortex of the femur. The skin was cut with a 10 blade  through subcutaneous tissue to the level of the fascia overlying the  tensor fascia lata muscle. The fascia was then incised in line with the  incision at the junction of the anterior third and posterior 2/3rd. The  muscle was teased off the fascia and then the interval between the TFL  and the rectus was developed. The Hohmann retractor was then placed at  the top of the femoral neck over the capsule. The vessels overlying the  capsule were cauterized and the fat on top of the capsule was removed.  A Hohmann retractor was then placed anterior underneath the rectus  femoris to give exposure to the entire anterior capsule. A T-shaped   capsulotomy was performed. The edges were tagged and the femoral head  was identified.       Osteophytes are removed off the superior acetabulum.  The femoral neck was then cut in situ with an oscillating saw. Traction  was then applied to the left lower extremity utilizing the [facility]  traction. The femoral head was then removed. Retractors were placed  around the acetabulum and then circumferential removal of the labrum was  performed. Osteophytes were also removed. Reaming starts at 49 mm to  medialize and  Increased in 2 mm increments to 53 mm. We reamed in  approximately 40 degrees of abduction, 20 degrees anteversion. A 54 mm  pinnacle acetabular shell was then impacted in anatomic position under  fluoroscopic guidance with excellent purchase. We did not need to place  any additional dome screws. A 36 mm neutral+ 4 marathon liner was then  placed into the acetabular shell.       The femoral lift was then placed along the lateral aspect of the femur  just distal to the vastus ridge. The leg was  externally rotated and capsule  was stripped off the inferior aspect of the femoral neck down to the  level of the lesser trochanter, this was done with electrocautery. The femur was lifted after this was performed. The  leg was then placed in an extended and adducted position essentially delivering the femur. We also removed the capsule superiorly and the piriformis from the piriformis fossa  to gain excellent exposure of the  proximal femur. Rongeur was used to remove some cancellous bone to get  into the lateral portion of the proximal femur for placement of the  initial starter reamer. The starter broaches was placed  the starter broach  and was shown to go down the center of the canal. Broaching  with the Actis system was then performed starting at size 0  coursing  Up to size 2. A size 2 had excellent torsional and rotational  and axial stability. The trial high offset neck was then placed   with a 36 + 5 trial head. The hip was then reduced. We confirmed that  the stem was in the canal both on AP and lateral x-rays. It also has excellent sizing. The hip was reduced with outstanding stability through full extension and full external rotation.. AP pelvis was taken and the leg lengths were measured and found to be equal. Hip was then dislocated again and the femoral head and neck removed. The  femoral broach was removed. Size 2 Actis stem with a high offset  neck was then impacted into the femur following native anteversion. Has  excellent purchase in the canal. Excellent torsional and rotational and  axial stability. It is confirmed to be in the canal on AP and lateral  fluoroscopic views. The 36 + 5 ceramic head was placed and the hip  reduced with outstanding stability. Again AP pelvis was taken and it  confirmed that the leg lengths were equal. The wound was then copiously  irrigated with saline solution and the capsule reattached and repaired  with Ethibond suture. 30 ml of .25% Bupivicaine was  injected into the capsule and into the edge of the tensor fascia lata as well as subcutaneous tissue. The fascia overlying the tensor fascia lata was then closed with a running #1 V-Loc. Subcu was closed with interrupted 2-0 Vicryl and subcuticular running 4-0 Monocryl. Incision was cleaned  and dried. Steri-Strips and a bulky sterile dressing applied. The patient was awakened and transported to  recovery in stable condition.        Please note that a surgical assistant was a medical necessity for this procedure to perform it in a safe and expeditious manner. Assistant was necessary to provide appropriate retraction of vital neurovascular structures and to prevent femoral fracture and allow for anatomic placement of the prosthesis.  Gaynelle Arabian, M.D.

## 2021-03-11 NOTE — Discharge Instructions (Addendum)
Gaynelle Arabian, MD Total Joint Specialist [facility] [address], [address] [place], [place] [address] [phone]  ANTERIOR APPROACH TOTAL HIP REPLACEMENT POSTOPERATIVE DIRECTIONS     Hip Rehabilitation, Guidelines Following Surgery  The results of a hip operation are greatly improved after range of motion and muscle strengthening exercises. Follow all safety measures which are given to protect your hip. If any of these exercises cause increased pain or swelling in your joint, decrease the amount until you are comfortable again. Then slowly increase the exercises. Call your caregiver if you have problems or questions.   BLOOD CLOT PREVENTION Take a 10 mg Xarelto once a day for three weeks following surgery. Then resume one 81 mg aspirin once a day. You may resume your vitamins/supplements once you have discontinued the Xarelto. Do not take any NSAIDs (Advil, Aleve, Ibuprofen, Meloxicam, etc.) until you have discontinued the Xarelto.   HOME CARE INSTRUCTIONS  Remove items at home which could result in a fall. This includes throw rugs or furniture in walking pathways.  ICE to the affected hip as frequently as 20-30 minutes an hour and then as needed for pain and swelling. Continue to use ice on the hip for pain and swelling from surgery. You may notice swelling that will progress down to the foot and ankle. This is normal after surgery. Elevate the leg when you are not up walking on it.   Continue to use the breathing machine which will help keep your temperature down.  It is common for your temperature to cycle up and down following surgery, especially at night when you are not up moving around and exerting yourself.  The breathing machine keeps your lungs expanded and your temperature down.  DIET You may resume your previous home diet once your are discharged from the hospital.  DRESSING / WOUND CARE / SHOWERING You have an adhesive waterproof bandage over the  incision. Leave this in place until your first follow-up appointment. Once you remove this you will not need to place another bandage.  You may begin showering 3 days following surgery, but do not submerge the incision under water.  ACTIVITY For the first 3-5 days, it is important to rest and keep the operative leg elevated. You should, as a general rule, rest for 50 minutes and walk/stretch for 10 minutes per hour. After 5 days, you may slowly increase activity as tolerated.  Perform the exercises you were provided twice a day for about 15-20 minutes each session. Begin these 2 days following surgery. Walk with your walker as instructed. Use the walker until you are comfortable transitioning to a cane. Walk with the cane in the opposite hand of the operative leg. You may discontinue the cane once you are comfortable and walking steadily. Avoid periods of inactivity such as sitting longer than an hour when not asleep. This helps prevent blood clots.  Do not drive a car for 6 weeks or until released by your surgeon.  Do not drive while taking narcotics.  TED HOSE STOCKINGS Wear the elastic stockings on both legs for three weeks following surgery during the day. You may remove them at night while sleeping.  WEIGHT BEARING Weight bearing as tolerated with assist device (walker, cane, etc) as directed, use it as long as suggested by your surgeon or therapist, typically at least 4-6 weeks.  POSTOPERATIVE CONSTIPATION PROTOCOL Constipation - defined medically as fewer than three stools per week and severe constipation as less than one stool per week.  less than one stool per week.  One of the most common issues patients have following surgery is constipation.  Even if you have a regular bowel pattern at home, your normal regimen is likely to be disrupted due to multiple reasons following surgery.  Combination of anesthesia, postoperative narcotics, change in appetite and fluid intake all can  affect your bowels.  In order to avoid complications following surgery, here are some recommendations in order to help you during your recovery period.  Colace (docusate) - Pick up an over-the-counter form of Colace or another stool softener and take twice a day as long as you are requiring postoperative pain medications.  Take with a full glass of water daily.  If you experience loose stools or diarrhea, hold the colace until you stool forms back up.  If your symptoms do not get better within 1 week or if they get worse, check with your doctor. Dulcolax (bisacodyl) - Pick up over-the-counter and take as directed by the product packaging as needed to assist with the movement of your bowels.  Take with a full glass of water.  Use this product as needed if not relieved by Colace only.  MiraLax (polyethylene glycol) - Pick up over-the-counter to have on hand.  MiraLax is a solution that will increase the amount of water in your bowels to assist with bowel movements.  Take as directed and can mix with a glass of water, juice, soda, coffee, or tea.  Take if you go more than two days without a movement.Do not use MiraLax more than once per day. Call your doctor if you are still constipated or irregular after using this medication for 7 days in a row.  If you continue to have problems with postoperative constipation, please contact the office for further assistance and recommendations.  If you experience "the worst abdominal pain ever" or develop nausea or vomiting, please contact the office immediatly for further recommendations for treatment.  ITCHING  If you experience itching with your medications, try taking only a single pain pill, or even half a pain pill at a time.  You can also use Benadryl over the counter for itching or also to help with sleep.   MEDICATIONS See your medication summary on the "After Visit Summary" that the nursing staff will review with you prior to discharge.  You may have some home  medications which will be placed on hold until you complete the course of blood thinner medication.  It is important for you to complete the blood thinner medication as prescribed by your surgeon.  Continue your approved medications as instructed at time of discharge.  PRECAUTIONS If you experience chest pain or shortness of breath - call 911 immediately for transfer to the hospital emergency department.  If you develop a fever greater that 101 F, purulent drainage from wound, increased redness or drainage from wound, foul odor from the wound/dressing, or calf pain - CONTACT YOUR SURGEON.                                                   FOLLOW-UP APPOINTMENTS Make sure you keep all of your appointments after your operation with your surgeon and caregivers. You should call the office at the above phone number and make an appointment for approximately two weeks after the date of your surgery or on the   date instructed by your surgeon outlined in the "After Visit Summary".  RANGE OF MOTION AND STRENGTHENING EXERCISES  These exercises are designed to help you keep full movement of your hip joint. Follow your caregiver's or physical therapist's instructions. Perform all exercises about fifteen times, three times per day or as directed. Exercise both hips, even if you have had only one joint replacement. These exercises can be done on a training (exercise) mat, on the floor, on a table or on a bed. Use whatever works the best and is most comfortable for you. Use music or television while you are exercising so that the exercises are a pleasant break in your day. This will make your life better with the exercises acting as a break in routine you can look forward to.  Lying on your back, slowly slide your foot toward your buttocks, raising your knee up off the floor. Then slowly slide your foot back down until your leg is straight again.  Lying on your back spread your legs as far apart as you can without causing  discomfort.  Lying on your side, raise your upper leg and foot straight up from the floor as far as is comfortable. Slowly lower the leg and repeat.  Lying on your back, tighten up the muscle in the front of your thigh (quadriceps muscles). You can do this by keeping your leg straight and trying to raise your heel off the floor. This helps strengthen the largest muscle supporting your knee.  Lying on your back, tighten up the muscles of your buttocks both with the legs straight and with the knee bent at a comfortable angle while keeping your heel on the floor.   POST-OPERATIVE OPIOID TAPER INSTRUCTIONS: It is important to wean off of your opioid medication as soon as possible. If you do not need pain medication after your surgery it is ok to stop day one. Opioids include: Codeine, Hydrocodone(Norco, Vicodin), Oxycodone(Percocet, oxycontin) and hydromorphone amongst others.  Long term and even short term use of opiods can cause: Increased pain response Dependence Constipation Depression Respiratory depression And more.  Withdrawal symptoms can include Flu like symptoms Nausea, vomiting And more Techniques to manage these symptoms Hydrate well Eat regular healthy meals Stay active Use relaxation techniques(deep breathing, meditating, yoga) Do Not substitute Alcohol to help with tapering If you have been on opioids for less than two weeks and do not have pain than it is ok to stop all together.  Plan to wean off of opioids This plan should start within one week post op of your joint replacement. Maintain the same interval or time between taking each dose and first decrease the dose.  Cut the total daily intake of opioids by one tablet each day Next start to increase the time between doses. The last dose that should be eliminated is the evening dose.   IF YOU ARE TRANSFERRED TO A SKILLED REHAB FACILITY If the patient is transferred to a skilled rehab facility following release from the  hospital, a list of the current medications will be sent to the facility for the patient to continue.  When discharged from the skilled rehab facility, please have the facility set up the patient's Home Health Physical Therapy prior to being released. Also, the skilled facility will be responsible for providing the patient with their medications at time of release from the facility to include their pain medication, the muscle relaxants, and their blood thinner medication. If the patient is still at the rehab facility   up appointment, the skilled rehab facility will also need to assist the patient in arranging follow up appointment in our office and any transportation needs.  MAKE SURE YOU:  Understand these instructions.  Get help right away if you are not doing well or get worse.    DENTAL ANTIBIOTICS:  In most cases prophylactic antibiotics for Dental procdeures after total joint surgery are not necessary.  Exceptions are as follows:  1. History of prior total joint infection  2. Severely immunocompromised (Organ Transplant, cancer chemotherapy, Rheumatoid biologic meds such as [place])  3. Poorly controlled diabetes (A1C &gt; 8.0, blood glucose over 200)  If you have one of these conditions, contact your surgeon for an antibiotic prescription, prior to your dental procedure.    Pick up stool softner and laxative for home use following surgery while on pain medications. Do not submerge incision under water. Please use good hand washing techniques while changing dressing each day. May shower starting three days after surgery. Please use a clean towel to pat the incision dry following showers. Continue to use ice for pain and swelling after surgery. Do not use any lotions or creams on the incision until instructed by your surgeon.   Information on my medicine - XARELTO (Rivaroxaban) Why was Xarelto prescribed for you? Xarelto was prescribed for you to reduce the risk of blood clots  forming after orthopedic surgery. The medical term for these abnormal blood clots is venous thromboembolism (VTE).  What do you need to know about xarelto ? Take your Xarelto ONCE DAILY at the same time every day. You may take it either with or without food.  If you have difficulty swallowing the tablet whole, you may crush it and mix in applesauce just prior to taking your dose.  Take Xarelto exactly as prescribed by your doctor and DO NOT stop taking Xarelto without talking to the doctor who prescribed the medication.  Stopping without other VTE prevention medication to take the place of Xarelto may increase your risk of developing a clot.  After discharge, you should have regular check-up appointments with your healthcare provider that is prescribing your Xarelto.    What do you do if you miss a dose? If you miss a dose, take it as soon as you remember on the same day then continue your regularly scheduled once daily regimen the next day. Do not take two doses of Xarelto on the same day.   Important Safety Information A possible side effect of Xarelto is bleeding. You should call your healthcare provider right away if you experience any of the following: Bleeding from an injury or your nose that does not stop. Unusual colored urine (red or dark brown) or unusual colored stools (red or black). Unusual bruising for unknown reasons. A serious fall or if you hit your head (even if there is no bleeding).  Some medicines may interact with Xarelto and might increase your risk of bleeding while on Xarelto. To help avoid this, consult your healthcare provider or pharmacist prior to using any new prescription or non-prescription medications, including herbals, vitamins, non-steroidal anti-inflammatory drugs (NSAIDs) and supplements.  This website has more information on Xarelto: [email or web address]

## 2021-03-11 NOTE — Anesthesia Procedure Notes (Signed)
Spinal  Patient location during procedure: OR Start time: 03/11/2021 8:08 AM End time: 03/11/2021 8:13 AM Reason for block: surgical anesthesia Staffing Performed: anesthesiologist  Anesthesiologist: Suzette Battiest, MD Preanesthetic Checklist Completed: patient identified, IV checked, site marked, risks and benefits discussed, surgical consent, monitors and equipment checked, pre-op evaluation and timeout performed Spinal Block Patient position: sitting Prep: DuraPrep Patient monitoring: heart rate, cardiac monitor, continuous pulse ox and blood pressure Approach: midline Location: L4-5 Injection technique: single-shot Needle Needle type: Pencan  Needle gauge: 24 G Needle length: 9 cm Assessment Sensory level: T4 Events: CSF return

## 2021-03-11 NOTE — Evaluation (Signed)
Physical Therapy Evaluation Patient Details Name: Joshua Cabrera MRN: [id number] DOB: 1955-08-26 Today's Date: 03/11/2021   History of Present Illness  Patient is 65 y.o. male s/p Rt THA anterior approach on 03/11/21 with PMH significant for OA, GERD, Lt THA in 2015, Rt RCRx1, Lt RCRx2, back surgery in 2015.   Clinical Impression  Joshua Cabrera is a 65 y.o. male POD 0 s/p Rt THA. Patient reports independence with mobility at baseline. Patient is now limited by functional impairments (see PT problem list below) and requires min guard/assist for transfers and gait with RW. Patient was able to ambulate ~8 feet with RW and min assist/guard and was limited by symptomatic hypotension. Patient instructed in exercise to facilitate ROM and circulation to manage edema. Patient will benefit from continued skilled PT interventions to address impairments and progress towards PLOF. Acute PT will follow to progress mobility and stair training in preparation for safe discharge home.      Follow Up Recommendations Follow surgeon's recommendation for DC plan and follow-up therapies;Outpatient PT    Equipment Recommendations  Rolling walker with 5" wheels;3in1 (PT)    Recommendations for Other Services       Precautions / Restrictions Precautions Precautions: Fall Restrictions Weight Bearing Restrictions: No RLE Weight Bearing: Weight bearing as tolerated      Mobility  Bed Mobility Overal bed mobility: Needs Assistance Bed Mobility: Supine to Sit     Supine to sit: Min assist;HOB elevated     General bed mobility comments: cues to bring LE's off EOB and assist for Rt LE    Transfers Overall transfer level: Needs assistance Equipment used: Rolling walker (2 wheeled) Transfers: Sit to/from Stand Sit to Stand: Min guard         General transfer comment: cues for hand placement/technique. close guarding for safety with rise.  Ambulation/Gait Ambulation/Gait assistance: Min assist;Min  guard Gait Distance (Feet): 8 Feet Assistive device: Rolling walker (2 wheeled) Gait Pattern/deviations: Step-to pattern;Decreased stride length Gait velocity: decr   General Gait Details: cues for step to pattern and proximity to RW, no overt LOB noted. pt c/o nauseous and dizzy sensation and required seated rest break. BP 98/65 mmHg with HR 51 bpm. Pt reclined in chair and symptoms improved and BP up to 110/72 and HR of 66 bpm.  Stairs            Wheelchair Mobility    Modified Rankin (Stroke Patients Only)       Balance Overall balance assessment: Needs assistance Sitting-balance support: Feet supported Sitting balance-Leahy Scale: Good     Standing balance support: During functional activity;Bilateral upper extremity supported Standing balance-Leahy Scale: Poor                               Pertinent Vitals/Pain Pain Assessment: 0-10 Pain Score: 5  Pain Location: Rt hip Pain Descriptors / Indicators: Aching;Discomfort;Cramping Pain Intervention(s): Limited activity within patient's tolerance;Monitored during session;Premedicated before session;Repositioned;Ice applied    Home Living Family/patient expects to be discharged to:: Private residence Living Arrangements: Spouse/significant other Available Help at Discharge: Family Type of Home: House Home Access: Stairs to enter Entrance Stairs-Rails: Left Entrance Stairs-Number of Steps: 3 Home Layout: One level;Laundry or work area in [place]: Kasandra Knudsen - single point      Prior Function Level of Independence: Independent               Hand Dominance   Dominant  Hand: Right    Extremity/Trunk Assessment   Upper Extremity Assessment Upper Extremity Assessment: Overall WFL for tasks assessed    Lower Extremity Assessment Lower Extremity Assessment: Overall WFL for tasks assessed    Cervical / Trunk Assessment Cervical / Trunk Assessment: Normal  Communication    Communication: No difficulties  Cognition Arousal/Alertness: Awake/alert Behavior During Therapy: WFL for tasks assessed/performed Overall Cognitive Status: Within Functional Limits for tasks assessed                                        General Comments      Exercises Total Joint Exercises Ankle Circles/Pumps: AROM;Both;20 reps;Seated   Assessment/Plan    PT Assessment Patient needs continued PT services  PT Problem List Decreased strength;Decreased range of motion;Decreased activity tolerance;Decreased balance;Decreased mobility;Decreased knowledge of use of DME;Decreased safety awareness;Decreased knowledge of precautions;Pain       PT Treatment Interventions DME instruction;Gait training;Stair training;Functional mobility training;Therapeutic activities;Therapeutic exercise;Balance training;Patient/family education    PT Goals (Current goals can be found in the Care Plan section)  Acute Rehab PT Goals Patient Stated Goal: be able to walk without pain PT Goal Formulation: With patient Time For Goal Achievement: 03/18/21 Potential to Achieve Goals: Good    Frequency 7X/week   Barriers to discharge        Co-evaluation               AM-PAC PT "6 Clicks" Mobility  Outcome Measure Help needed turning from your back to your side while in a flat bed without using bedrails?: A Little Help needed moving from lying on your back to sitting on the side of a flat bed without using bedrails?: A Little Help needed moving to and from a bed to a chair (including a wheelchair)?: A Little Help needed standing up from a chair using your arms (e.g., wheelchair or bedside chair)?: A Little Help needed to walk in hospital room?: A Little Help needed climbing 3-5 steps with a railing? : A Little 6 Click Score: 18    End of Session Equipment Utilized During Treatment: Gait belt Activity Tolerance: Patient tolerated treatment well Patient left: in chair;with call  bell/phone within reach;with chair alarm set Nurse Communication: Mobility status (orthostatic hypotension) PT Visit Diagnosis: Muscle weakness (generalized) (M62.81);Difficulty in walking, not elsewhere classified (R26.2)    Time: [id number] PT Time Calculation (min) (ACUTE ONLY): 31 min   Charges:   PT Evaluation $PT Eval Low Complexity: 1 Low PT Treatments $Gait Training: 8-22 mins        Verner Mould, DPT Acute Rehabilitation Services Office [phone] Pager [phone]   Jacques Navy 03/11/2021, 4:23 PM

## 2021-03-11 NOTE — Anesthesia Preprocedure Evaluation (Addendum)
Anesthesia Evaluation  Patient identified by MRN, date of birth, ID band Patient awake    Reviewed: Allergy & Precautions, NPO status , Patient's Chart, lab work & pertinent test results  History of Anesthesia Complications (+) PONV  Airway Mallampati: II  TM Distance: >3 FB Neck ROM: Full    Dental  (+) Dental Advisory Given   Pulmonary sleep apnea ,    breath sounds clear to auscultation       Cardiovascular negative cardio ROS   Rhythm:Regular Rate:Normal     Neuro/Psych negative neurological ROS     GI/Hepatic Neg liver ROS, GERD  ,  Endo/Other  negative endocrine ROS  Renal/GU negative Renal ROS     Musculoskeletal  (+) Arthritis ,   Abdominal   Peds  Hematology  (+) anemia ,   Anesthesia Other Findings   Reproductive/Obstetrics                             Lab Results  Component Value Date   WBC 6.8 02/27/2021   HGB 11.2 (L) 02/27/2021   HCT 35.8 (L) 02/27/2021   MCV 95.7 02/27/2021   PLT 540 (H) 02/27/2021   Lab Results  Component Value Date   CREATININE 0.84 02/27/2021   BUN 18 02/27/2021   NA 135 02/27/2021   K 4.5 02/27/2021   CL 101 02/27/2021   CO2 28 02/27/2021    Anesthesia Physical Anesthesia Plan  ASA: 2  Anesthesia Plan: Spinal   Post-op Pain Management:    Induction:   PONV Risk Score and Plan: 2 and Propofol infusion, Ondansetron and Treatment may vary due to age or medical condition  Airway Management Planned: Natural Airway and Simple Face Mask  Additional Equipment:   Intra-op Plan:   Post-operative Plan:   Informed Consent: I have reviewed the patients History and Physical, chart, labs and discussed the procedure including the risks, benefits and alternatives for the proposed anesthesia with the patient or authorized representative who has indicated his/her understanding and acceptance.     Dental advisory given  Plan Discussed  with: CRNA  Anesthesia Plan Comments:         Anesthesia Quick Evaluation

## 2021-03-11 NOTE — Anesthesia Postprocedure Evaluation (Signed)
Anesthesia Post Note  Patient: Joshua Cabrera  Procedure(s) Performed: TOTAL HIP ARTHROPLASTY ANTERIOR APPROACH (Right: Hip)     Patient location during evaluation: PACU Anesthesia Type: Spinal Level of consciousness: awake and alert Pain management: pain level controlled Vital Signs Assessment: post-procedure vital signs reviewed and stable Respiratory status: spontaneous breathing and respiratory function stable Cardiovascular status: blood pressure returned to baseline and stable Postop Assessment: spinal receding Anesthetic complications: no   No notable events documented.  Last Vitals:  Vitals:   03/11/21 1200 03/11/21 1326  BP: 110/66 117/78  Pulse: 63 67  Resp: 10 19  Temp: (!) 36.4 C 36.6 C  SpO2: 100% 100%    Last Pain:  Vitals:   03/11/21 1326  TempSrc: Oral  PainSc:                  [name]

## 2021-03-11 NOTE — Progress Notes (Signed)
Orthopedic Tech Progress Note Patient Details:  Joshua Cabrera 23-Aug-1955 [id number]  Ortho Devices Ortho Device/Splint Location: Trapeze bar Ortho Device/Splint Interventions: Application   Post Interventions Patient Tolerated: Well Instructions Provided: Care of device, Adjustment of device  [place] Pink 03/11/2021, 3:46 PM

## 2021-03-11 NOTE — Interval H&P Note (Signed)
History and Physical Interval Note:  03/11/2021 6:33 AM  Joshua Cabrera  has presented today for surgery, with the diagnosis of right hip osteoarthritis.  The various methods of treatment have been discussed with the patient and family. After consideration of risks, benefits and other options for treatment, the patient has consented to  Procedure(s): TOTAL HIP ARTHROPLASTY ANTERIOR APPROACH (Right) as a surgical intervention.  The patient's history has been reviewed, patient examined, no change in status, stable for surgery.  I have reviewed the patient's chart and labs.  Questions were answered to the patient's satisfaction.     Pilar Plate Ermelinda Eckert

## 2021-03-11 NOTE — Transfer of Care (Signed)
Immediate Anesthesia Transfer of Care Note  Patient: Joshua Cabrera  Procedure(s) Performed: TOTAL HIP ARTHROPLASTY ANTERIOR APPROACH (Right: Hip)  Patient Location: PACU  Anesthesia Type:MAC and Spinal  Level of Consciousness: awake, alert  and oriented  Airway & Oxygen Therapy: Patient Spontanous Breathing and Patient connected to face mask  Post-op Assessment: Report given to RN and Post -op Vital signs reviewed and stable  Post vital signs: Reviewed and stable  Last Vitals:  Vitals Value Taken Time  BP 105/58 03/11/21 1006  Temp    Pulse 58 03/11/21 1008  Resp 13 03/11/21 1008  SpO2 100 % 03/11/21 1008  Vitals shown include unvalidated device data.  Last Pain:  Vitals:   03/11/21 0645  TempSrc: Oral  PainSc:          Complications: No notable events documented.

## 2021-03-12 ENCOUNTER — Encounter (HOSPITAL_COMMUNITY): Payer: Self-pay | Admitting: Orthopedic Surgery

## 2021-03-12 DIAGNOSIS — M1611 Unilateral primary osteoarthritis, right hip: Secondary | ICD-10-CM | POA: Diagnosis not present

## 2021-03-12 LAB — CBC WITH DIFFERENTIAL/PLATELET
Abs Immature Granulocytes: 0.08 10*3/uL — ABNORMAL HIGH (ref 0.00–0.07)
Basophils Absolute: 0 10*3/uL (ref 0.0–0.1)
Basophils Relative: 0 %
Eosinophils Absolute: 0 10*3/uL (ref 0.0–0.5)
Eosinophils Relative: 0 %
HCT: 26.9 % — ABNORMAL LOW (ref 39.0–52.0)
Hemoglobin: 8.5 g/dL — ABNORMAL LOW (ref 13.0–17.0)
Immature Granulocytes: 1 %
Lymphocytes Relative: 10 %
Lymphs Abs: 1.3 10*3/uL (ref 0.7–4.0)
MCH: 30.5 pg (ref 26.0–34.0)
MCHC: 31.6 g/dL (ref 30.0–36.0)
MCV: 96.4 fL (ref 80.0–100.0)
Monocytes Absolute: 1.2 10*3/uL — ABNORMAL HIGH (ref 0.1–1.0)
Monocytes Relative: 10 %
Neutro Abs: 10 10*3/uL — ABNORMAL HIGH (ref 1.7–7.7)
Neutrophils Relative %: 79 %
Platelets: 140 10*3/uL — ABNORMAL LOW (ref 150–400)
RBC: 2.79 MIL/uL — ABNORMAL LOW (ref 4.22–5.81)
RDW: 14.2 % (ref 11.5–15.5)
WBC: 12.6 10*3/uL — ABNORMAL HIGH (ref 4.0–10.5)
nRBC: 0 % (ref 0.0–0.2)

## 2021-03-12 LAB — BASIC METABOLIC PANEL
Anion gap: 5 (ref 5–15)
BUN: 13 mg/dL (ref 8–23)
CO2: 25 mmol/L (ref 22–32)
Calcium: 8.1 mg/dL — ABNORMAL LOW (ref 8.9–10.3)
Chloride: 103 mmol/L (ref 98–111)
Creatinine, Ser: 0.66 mg/dL (ref 0.61–1.24)
GFR, Estimated: >60 mL/min (ref >60)
Glucose, Bld: 129 mg/dL — ABNORMAL HIGH (ref 70–99)
Potassium: 4 mmol/L (ref 3.5–5.1)
Sodium: 133 mmol/L — ABNORMAL LOW (ref 135–145)

## 2021-03-12 LAB — HEMOGLOBIN AND HEMATOCRIT, BLOOD
HCT: 27.5 % — ABNORMAL LOW (ref 39.0–52.0)
Hemoglobin: 8.5 g/dL — ABNORMAL LOW (ref 13.0–17.0)

## 2021-03-12 MED ORDER — METHOCARBAMOL 500 MG PO TABS: 500.0000 mg | ORAL_TABLET | Freq: Four times a day (QID) | ORAL | 0 refills | Status: DC | PRN

## 2021-03-12 MED ORDER — PANTOPRAZOLE SODIUM 40 MG PO TBEC
40.0000 mg | DELAYED_RELEASE_TABLET | Freq: Every day | ORAL | Status: DC
  Administered 2021-03-12 – 2021-03-13 (×2): 40 mg via ORAL
  Filled 2021-03-12 (×2): qty 1

## 2021-03-12 MED ORDER — RIVAROXABAN 10 MG PO TABS: 10.0000 mg | ORAL_TABLET | Freq: Every day | ORAL | 0 refills | Status: AC

## 2021-03-12 MED ORDER — SODIUM CHLORIDE 0.9 % IV BOLUS
500.0000 mL | Freq: Once | INTRAVENOUS | Status: AC
  Administered 2021-03-12: 500 mL via INTRAVENOUS

## 2021-03-12 MED ORDER — TRAMADOL HCL 50 MG PO TABS: 50.0000 mg | ORAL_TABLET | Freq: Four times a day (QID) | ORAL | 0 refills | Status: DC | PRN

## 2021-03-12 MED ORDER — OXYCODONE HCL 5 MG PO TABS: 5.0000 mg | ORAL_TABLET | Freq: Four times a day (QID) | ORAL | 0 refills | Status: DC | PRN

## 2021-03-12 NOTE — Progress Notes (Signed)
   Subjective: 1 Day Post-Op Procedure(s) (LRB): TOTAL HIP ARTHROPLASTY ANTERIOR APPROACH (Right) Patient reports pain as mild.   Patient seen in rounds by Dr. Wynelle Link. Patient has complaints of indigestion this morning. Not currently on any anti-reflux medications. 40 mg protonix ordered, will continue to monitor. Denies SOB or calf pain. No issues overnight.  We will continue therapy today.   Objective: Vital signs in last 24 hours: Temp:  [97.5 F (36.4 C)-98.9 F (37.2 C)] 98.9 F (37.2 C) (07/28 0615) Pulse Rate:  [58-78] 78 (07/28 0615) Resp:  [10-19] 13 (07/28 0615) BP: (89-122)/(56-79) 102/79 (07/28 0615) SpO2:  [98 %-100 %] 100 % (07/28 0615) Weight:  [79.4 kg] 79.4 kg (07/27 1326)  Intake/Output from previous day:  Intake/Output Summary (Last 24 hours) at 03/12/2021 0733 Last data filed at 03/11/2021 2342 Gross per 24 hour  Intake 2256.23 ml  Output 3150 ml  Net -893.77 ml     Intake/Output this shift: No intake/output data recorded.  Labs: Recent Labs    03/12/21 0349  HGB 8.5*   Recent Labs    03/12/21 0349  WBC 12.6*  RBC 2.79*  HCT 26.9*  PLT 140*   Recent Labs    03/12/21 0349  NA 133*  K 4.0  CL 103  CO2 25  BUN 13  CREATININE 0.66  GLUCOSE 129*  CALCIUM 8.1*   No results for input(s): LABPT, INR in the last 72 hours.  Exam: General - Patient is Alert and Oriented Extremity - Neurologically intact Neurovascular intact Sensation intact distally Dorsiflexion/Plantar flexion intact Dressing - dressing C/D/I Motor Function - intact, moving foot and toes well on exam.   Past Medical History:  Diagnosis Date   Arthritis    osteoarthritis -hips,spine,hands and left wrist.   Arthritis    Complication of anesthesia    GERD (gastroesophageal reflux disease)    Pneumonia    PONV (postoperative nausea and vomiting)    Sleep apnea    Thrombocytosis    Unspecified diseases of blood and blood-forming organs    "essential  hyperthrombocytosis"    Assessment/Plan: 1 Day Post-Op Procedure(s) (LRB): TOTAL HIP ARTHROPLASTY ANTERIOR APPROACH (Right) Active Problems:   Primary osteoarthritis of right hip  Estimated body mass index is 25.84 kg/m as calculated from the following:   Height as of this encounter: '5\' 9"'$  (1.753 m).   Weight as of this encounter: 79.4 kg. Advance diet Up with therapy  DVT Prophylaxis - Xarelto Weight bearing as tolerated. Continue therapy.  Hgb dropped from 11.2 to to 8.5 with approximately 600 mL estimated blood loss in surgery. Will recheck H&H at 12:00 pm to ensure this is stable. Protonix ordered for indigestion. If this does not improve with medication, will order EKG to rule out cardiac cause. Vital signs stable currently. BP soft, 500 mL bolus ordered.  Plan is to go Home after hospital stay.  Theresa Duty, PA-C Orthopedic Surgery [phone] 03/12/2021, 7:33 AM

## 2021-03-12 NOTE — Progress Notes (Signed)
Physical Therapy Treatment Patient Details Name: Joshua Cabrera MRN: [id number] DOB: December 02, 1955 Today's Date: 03/12/2021    History of Present Illness Patient is 65 y.o. male s/p Rt THA anterior approach on 03/11/21 with PMH significant for OA, GERD, Lt THA in 2015, Rt RCRx1, Lt RCRx2, back surgery in 2015.    PT Comments    Pt attempted to ambulate however only tolerated short distance due to nausea.  RN notified pt remains nauseated.    Follow Up Recommendations  Follow surgeon's recommendation for DC plan and follow-up therapies;Outpatient PT     Equipment Recommendations  Rolling walker with 5" wheels;3in1 (PT)    Recommendations for Other Services       Precautions / Restrictions Precautions Precautions: Fall Restrictions RLE Weight Bearing: Weight bearing as tolerated    Mobility  Bed Mobility Overal bed mobility: Needs Assistance Bed Mobility: Supine to Sit     Supine to sit: HOB elevated;Min assist     General bed mobility comments: verbal cues for technique, assist for Rt LE    Transfers Overall transfer level: Needs assistance Equipment used: Rolling walker (2 wheeled) Transfers: Sit to/from Stand Sit to Stand: Min guard         General transfer comment: verbal cues for UE and LE positioning  Ambulation/Gait Ambulation/Gait assistance: Min guard Gait Distance (Feet): 24 Feet Assistive device: Rolling walker (2 wheeled) Gait Pattern/deviations: Step-to pattern;Antalgic;Decreased stance time - right Gait velocity: decr   General Gait Details: verbal cues for sequence, RW positioning; pt reports nausea and perspiration but denies dizziness; returned to recliner; BP 120/70 mmHg, RN notified of nausea   Stairs             Wheelchair Mobility    Modified Rankin (Stroke Patients Only)       Balance                                            Cognition Arousal/Alertness: Awake/alert Behavior During Therapy: WFL for  tasks assessed/performed Overall Cognitive Status: Within Functional Limits for tasks assessed                                        Exercises      General Comments        Pertinent Vitals/Pain Pain Assessment: 0-10 Pain Score: 5  Pain Location: Rt hip Pain Descriptors / Indicators: Aching;Discomfort;Sore Pain Intervention(s): Repositioned;Monitored during session    Home Living                      Prior Function            PT Goals (current goals can now be found in the care plan section) Progress towards PT goals: Progressing toward goals    Frequency    7X/week      PT Plan Current plan remains appropriate    Co-evaluation              AM-PAC PT "6 Clicks" Mobility   Outcome Measure  Help needed turning from your back to your side while in a flat bed without using bedrails?: A Little Help needed moving from lying on your back to sitting on the side of a flat bed without using bedrails?: A Little Help needed moving to  and from a bed to a chair (including a wheelchair)?: A Little Help needed standing up from a chair using your arms (e.g., wheelchair or bedside chair)?: A Little Help needed to walk in hospital room?: A Little Help needed climbing 3-5 steps with a railing? : A Little 6 Click Score: 18    End of Session Equipment Utilized During Treatment: Gait belt Activity Tolerance: Other (comment) (limited by nausea) Patient left: in chair;with call bell/phone within reach;with chair alarm set Nurse Communication: Mobility status PT Visit Diagnosis: Muscle weakness (generalized) (M62.81);Difficulty in walking, not elsewhere classified (R26.2)     Time: [id number] PT Time Calculation (min) (ACUTE ONLY): 15 min  Charges:  $Gait Training: 8-22 mins                     Arlyce Dice, DPT Acute Rehabilitation Services Pager: [phone] Office: [phone]   York Ram E 03/12/2021, 12:30 PM

## 2021-03-12 NOTE — TOC Transition Note (Signed)
Transition of Care [facility]) - CM/SW Discharge Note  Patient Details  Name: Joshua Cabrera MRN: [id number] Date of Birth: 17-Nov-1955  Transition of Care [facility]) CM/SW Contact:  Sherie Don, LCSW Phone Number: 03/12/2021, 10:54 AM  Clinical Narrative: Patient is expected to discharge after working with PT. CSW met with patient to review discharge needs. Patient will discharge home with a home exercise program (HEP). Patient will need a rolling walker and 3N1. [employer] to deliver DME to patient's room. TOC signing off.  Final next level of care: Home/Self Care Barriers to Discharge: No Barriers Identified  Patient Goals and CMS Choice Patient states their goals for this hospitalization and ongoing recovery are:: Discharge home with HEP CMS Medicare.gov Compare Post Acute Care list provided to:: Patient Choice offered to / list presented to : Patient  Discharge Plan and Services        DME Arranged: 3-N-1, Walker rolling DME Agency: [employer] Representative spoke with at DME Agency: Pre-arranged in orthopedist's office  Readmission Risk Interventions No flowsheet data found.

## 2021-03-12 NOTE — Progress Notes (Signed)
Physical Therapy Treatment Patient Details Name: Joshua Cabrera MRN: [id number] DOB: 11/19/1955 Today's Date: 03/12/2021    History of Present Illness Patient is 65 y.o. male s/p Rt THA anterior approach on 03/11/21 with PMH significant for OA, GERD, Lt THA in 2015, Rt RCRx1, Lt RCRx2, back surgery in 2015.    PT Comments    Pt assisted with ambulating however again limited by nausea with mobility. Pt returned back to bed and performed supine exercises.  Pt not yet ready for d/c home.  RN aware.    Follow Up Recommendations  Follow surgeon's recommendation for DC plan and follow-up therapies;Outpatient PT     Equipment Recommendations  Rolling walker with 5" wheels;3in1 (PT)    Recommendations for Other Services       Precautions / Restrictions Precautions Precautions: Fall Restrictions RLE Weight Bearing: Weight bearing as tolerated    Mobility  Bed Mobility Overal bed mobility: Needs Assistance Bed Mobility: Sit to Supine     Supine to sit: HOB elevated;Min assist Sit to supine: HOB elevated;Min assist   General bed mobility comments: verbal cues for technique, assist for Rt LE    Transfers Overall transfer level: Needs assistance Equipment used: Rolling walker (2 wheeled) Transfers: Sit to/from Stand Sit to Stand: Min guard         General transfer comment: verbal cues for UE and LE positioning  Ambulation/Gait Ambulation/Gait assistance: Min guard Gait Distance (Feet): 24 Feet Assistive device: Rolling walker (2 wheeled) Gait Pattern/deviations: Step-to pattern;Antalgic;Decreased stance time - right Gait velocity: decr   General Gait Details: verbal cues for sequence, RW positioning; pt reports nausea again with mobility and requested return to bed   Stairs             Wheelchair Mobility    Modified Rankin (Stroke Patients Only)       Balance                                            Cognition Arousal/Alertness:  Awake/alert Behavior During Therapy: WFL for tasks assessed/performed Overall Cognitive Status: Within Functional Limits for tasks assessed                                        Exercises Total Joint Exercises Ankle Circles/Pumps: AROM;Both;10 reps Quad Sets: AROM;Both;10 reps Short Arc Quad: AROM;Right;10 reps Heel Slides: AAROM;Right;10 reps Hip ABduction/ADduction: AAROM;Right;10 reps    General Comments        Pertinent Vitals/Pain Pain Assessment: 0-10 Pain Score: 4  Pain Location: Rt hip Pain Descriptors / Indicators: Aching;Discomfort;Sore Pain Intervention(s): Repositioned;Monitored during session    Home Living                      Prior Function            PT Goals (current goals can now be found in the care plan section) Progress towards PT goals: Progressing toward goals    Frequency    7X/week      PT Plan Current plan remains appropriate    Co-evaluation              AM-PAC PT "6 Clicks" Mobility   Outcome Measure  Help needed turning from your back to your side while in a flat  bed without using bedrails?: A Little Help needed moving from lying on your back to sitting on the side of a flat bed without using bedrails?: A Little Help needed moving to and from a bed to a chair (including a wheelchair)?: A Little Help needed standing up from a chair using your arms (e.g., wheelchair or bedside chair)?: A Little Help needed to walk in hospital room?: A Little Help needed climbing 3-5 steps with a railing? : A Little 6 Click Score: 18    End of Session Equipment Utilized During Treatment: Gait belt Activity Tolerance: Other (comment) (limited by nausea) Patient left: with call bell/phone within reach;in bed;with bed alarm set Nurse Communication: Mobility status PT Visit Diagnosis: Muscle weakness (generalized) (M62.81);Difficulty in walking, not elsewhere classified (R26.2)     Time: [id number] PT Time  Calculation (min) (ACUTE ONLY): 20 min  Charges:   $Therapeutic Exercise: 8-22 mins                    Jannette Spanner PT, DPT Acute Rehabilitation Services Pager: [phone] Office: [phone]   York Ram E 03/12/2021, 2:21 PM

## 2021-03-12 NOTE — Progress Notes (Signed)
Pt did not void after foley removal, asymptomatic. Bladder scan was done, 274 ml. PA is notified. Recommended in and out cath if pt becomes symptomatic. Endorsed to incoming RN.

## 2021-03-13 DIAGNOSIS — M1611 Unilateral primary osteoarthritis, right hip: Secondary | ICD-10-CM | POA: Diagnosis not present

## 2021-03-13 LAB — CBC WITH DIFFERENTIAL/PLATELET
Abs Immature Granulocytes: 0.12 10*3/uL — ABNORMAL HIGH (ref 0.00–0.07)
Basophils Absolute: 0 10*3/uL (ref 0.0–0.1)
Basophils Relative: 0 %
Eosinophils Absolute: 0 10*3/uL (ref 0.0–0.5)
Eosinophils Relative: 0 %
HCT: 26.2 % — ABNORMAL LOW (ref 39.0–52.0)
Hemoglobin: 8.4 g/dL — ABNORMAL LOW (ref 13.0–17.0)
Immature Granulocytes: 1 %
Lymphocytes Relative: 10 %
Lymphs Abs: 1.4 10*3/uL (ref 0.7–4.0)
MCH: 30.3 pg (ref 26.0–34.0)
MCHC: 32.1 g/dL (ref 30.0–36.0)
MCV: 94.6 fL (ref 80.0–100.0)
Monocytes Absolute: 1.6 10*3/uL — ABNORMAL HIGH (ref 0.1–1.0)
Monocytes Relative: 11 %
Neutro Abs: 11.2 10*3/uL — ABNORMAL HIGH (ref 1.7–7.7)
Neutrophils Relative %: 78 %
Platelets: 174 10*3/uL (ref 150–400)
RBC: 2.77 MIL/uL — ABNORMAL LOW (ref 4.22–5.81)
RDW: 14 % (ref 11.5–15.5)
WBC: 14.4 10*3/uL — ABNORMAL HIGH (ref 4.0–10.5)
nRBC: 0 % (ref 0.0–0.2)

## 2021-03-13 LAB — BASIC METABOLIC PANEL
Anion gap: 6 (ref 5–15)
BUN: 12 mg/dL (ref 8–23)
CO2: 26 mmol/L (ref 22–32)
Calcium: 8.4 mg/dL — ABNORMAL LOW (ref 8.9–10.3)
Chloride: 100 mmol/L (ref 98–111)
Creatinine, Ser: 0.58 mg/dL — ABNORMAL LOW (ref 0.61–1.24)
GFR, Estimated: >60 mL/min (ref >60)
Glucose, Bld: 125 mg/dL — ABNORMAL HIGH (ref 70–99)
Potassium: 3.6 mmol/L (ref 3.5–5.1)
Sodium: 132 mmol/L — ABNORMAL LOW (ref 135–145)

## 2021-03-13 MED ORDER — CYCLOBENZAPRINE HCL 5 MG PO TABS: 5.0000 mg | ORAL_TABLET | Freq: Three times a day (TID) | ORAL | Status: DC | PRN

## 2021-03-13 MED ORDER — ONDANSETRON HCL 4 MG PO TABS: 4.0000 mg | ORAL_TABLET | Freq: Four times a day (QID) | ORAL | 0 refills | Status: DC | PRN

## 2021-03-13 MED ORDER — FERROUS SULFATE 325 (65 FE) MG PO TBEC: 325.0000 mg | DELAYED_RELEASE_TABLET | Freq: Two times a day (BID) | ORAL | 0 refills | Status: DC

## 2021-03-13 MED ORDER — CYCLOBENZAPRINE HCL 5 MG PO TABS: 10.0000 mg | ORAL_TABLET | Freq: Three times a day (TID) | ORAL | 0 refills | Status: AC | PRN

## 2021-03-13 NOTE — Progress Notes (Signed)
   Subjective: 2 Days Post-Op Procedure(s) (LRB): TOTAL HIP ARTHROPLASTY ANTERIOR APPROACH (Right) Patient reports pain as mild.   Patient seen in rounds by Dr. Wynelle Link. Patient's nausea has improved, but has hiccups this AM. Denies chest pain or SOB. Had issues with urinary retention yesterday, but this has resolved.  Plan is to go Home after hospital stay.  Objective: Vital signs in last 24 hours: Temp:  [97.9 F (36.6 C)-98.2 F (36.8 C)] 98.2 F (36.8 C) (07/29 0519) Pulse Rate:  [67-75] 75 (07/29 0519) Resp:  [16-17] 16 (07/29 0519) BP: (106-114)/(65-73) 113/67 (07/29 0519) SpO2:  [95 %-98 %] 98 % (07/29 0519)  Intake/Output from previous day:  Intake/Output Summary (Last 24 hours) at 03/13/2021 0734 Last data filed at 03/13/2021 0200 Gross per 24 hour  Intake 1806.35 ml  Output 1200 ml  Net 606.35 ml    Intake/Output this shift: No intake/output data recorded.  Labs: Recent Labs    03/12/21 0349 03/12/21 1201 03/13/21 0342  HGB 8.5* 8.5* 8.4*   Recent Labs    03/12/21 0349 03/12/21 1201 03/13/21 0342  WBC 12.6*  --  14.4*  RBC 2.79*  --  2.77*  HCT 26.9* 27.5* 26.2*  PLT 140*  --  174   Recent Labs    03/12/21 0349 03/13/21 0342  NA 133* 132*  K 4.0 3.6  CL 103 100  CO2 25 26  BUN 13 12  CREATININE 0.66 0.58*  GLUCOSE 129* 125*  CALCIUM 8.1* 8.4*   No results for input(s): LABPT, INR in the last 72 hours.  Exam: General - Patient is Alert and Oriented Extremity - Neurologically intact Neurovascular intact Sensation intact distally Dorsiflexion/Plantar flexion intact Dressing/Incision - clean, dry, no drainage Motor Function - intact, moving foot and toes well on exam.   Past Medical History:  Diagnosis Date   Arthritis    osteoarthritis -hips,spine,hands and left wrist.   Arthritis    Complication of anesthesia    GERD (gastroesophageal reflux disease)    Pneumonia    PONV (postoperative nausea and vomiting)    Sleep apnea     Thrombocytosis    Unspecified diseases of blood and blood-forming organs    "essential hyperthrombocytosis"    Assessment/Plan: 2 Days Post-Op Procedure(s) (LRB): TOTAL HIP ARTHROPLASTY ANTERIOR APPROACH (Right) Active Problems:   Primary osteoarthritis of right hip  Estimated body mass index is 25.84 kg/m as calculated from the following:   Height as of this encounter: '5\' 9"'$  (1.753 m).   Weight as of this encounter: 79.4 kg. Up with therapy D/C IV fluids  DVT Prophylaxis - Xarelto Weight-bearing as tolerated  Hemoglobin stable at 8.4, will send home with two weeks of ferrous sulfate.  Switching muscle relaxer due to uncontrollable hiccups.  Plan for discharge once cleared by PT.  Theresa Duty, PA-C Orthopedic Surgery [phone] 03/13/2021, 7:34 AM

## 2021-03-13 NOTE — Plan of Care (Signed)
  Problem: Pain Managment: Goal: General experience of comfort will improve Outcome: Progressing   Problem: Safety: Goal: Ability to remain free from injury will improve Outcome: Progressing   

## 2021-03-13 NOTE — Progress Notes (Signed)
Discharge package printed and instructions given to pt. Verbalizes understanding. 

## 2021-03-13 NOTE — Progress Notes (Signed)
Physical Therapy Treatment Patient Details Name: Joshua Cabrera MRN: [id number] DOB: Aug 16, 1956 Today's Date: 03/13/2021    History of Present Illness Patient is 65 y.o. male s/p Rt THA anterior approach on 03/11/21 with PMH significant for OA, GERD, Lt THA in 2015, Rt RCRx1, Lt RCRx2, back surgery in 2015.    PT Comments    Pt assisted with ambulating in hallway and practicing safe stair technique.  Pt reports feeling better today.  Pt also performed standing and sitting exercises and provided with HEP handout.  Pt reports understanding and feels ready for d/c home today.    Follow Up Recommendations  Follow surgeon's recommendation for DC plan and follow-up therapies;Outpatient PT     Equipment Recommendations  Rolling walker with 5" wheels;3in1 (PT)    Recommendations for Other Services       Precautions / Restrictions Precautions Precautions: Fall Restrictions Weight Bearing Restrictions: No RLE Weight Bearing: Weight bearing as tolerated    Mobility  Bed Mobility Overal bed mobility: Needs Assistance Bed Mobility: Supine to Sit;Sit to Supine     Supine to sit: Min guard Sit to supine: Min guard   General bed mobility comments: verbal cues for self assist for Rt LE    Transfers Overall transfer level: Needs assistance Equipment used: Rolling walker (2 wheeled) Transfers: Sit to/from Stand Sit to Stand: Min guard         General transfer comment: verbal cues for UE and LE positioning  Ambulation/Gait Ambulation/Gait assistance: Min guard Gait Distance (Feet): 160 Feet Assistive device: Rolling walker (2 wheeled) Gait Pattern/deviations: Step-to pattern;Step-through pattern;Decreased stance time - right;Antalgic     General Gait Details: verbal cues for sequence, RW positioning, step length   Stairs Stairs: Yes Stairs assistance: Min guard Stair Management: Step to pattern;Forwards;One rail Left Number of Stairs: 3 General stair comments: verbal cues  for sequence and safety, pt performed once with only left rail and 2 hands and then with left rail and SPC (has cane at home)   Wheelchair Mobility    Modified Rankin (Stroke Patients Only)       Balance                                            Cognition Arousal/Alertness: Awake/alert Behavior During Therapy: WFL for tasks assessed/performed Overall Cognitive Status: Within Functional Limits for tasks assessed                                        Exercises Total Joint Exercises Hip ABduction/ADduction: AAROM;Right;10 reps;Standing Long Arc Quad: AROM;Right;10 reps;Seated Knee Flexion: AROM;Right;Standing;10 reps Marching in Standing: AROM;Right;Standing;10 reps Standing Hip Extension: AROM;Right;Standing;10 reps    General Comments        Pertinent Vitals/Pain Pain Assessment: 0-10 Pain Score: 4  Pain Location: Rt hip Pain Descriptors / Indicators: Aching;Discomfort;Sore Pain Intervention(s): Repositioned;Monitored during session    Home Living                      Prior Function            PT Goals (current goals can now be found in the care plan section) Progress towards PT goals: Progressing toward goals    Frequency    7X/week      PT Plan  Current plan remains appropriate    Co-evaluation              AM-PAC PT "6 Clicks" Mobility   Outcome Measure  Help needed turning from your back to your side while in a flat bed without using bedrails?: A Little Help needed moving from lying on your back to sitting on the side of a flat bed without using bedrails?: A Little Help needed moving to and from a bed to a chair (including a wheelchair)?: A Little Help needed standing up from a chair using your arms (e.g., wheelchair or bedside chair)?: A Little Help needed to walk in hospital room?: A Little Help needed climbing 3-5 steps with a railing? : A Little 6 Click Score: 18    End of Session  Equipment Utilized During Treatment: Gait belt Activity Tolerance: Patient tolerated treatment well Patient left: in bed;with call bell/phone within reach   PT Visit Diagnosis: Difficulty in walking, not elsewhere classified (R26.2)     Time: [id number] PT Time Calculation (min) (ACUTE ONLY): 23 min  Charges:  $Gait Training: 8-22 mins $Therapeutic Exercise: 8-22 mins                     Jannette Spanner PT, DPT Acute Rehabilitation Services Pager: [phone] Office: [phone]  York Ram E 03/13/2021, 12:55 PM

## 2021-03-16 NOTE — Discharge Summary (Signed)
Physician Discharge Summary   Patient ID: Joshua Cabrera MRN: [id number] DOB/AGE: 12-25-1955 65 y.o.  Admit date: 03/11/2021 Discharge date: 03/13/2021  Primary Diagnosis: Osteoarthritis, right hip   Admission Diagnoses:  Past Medical History:  Diagnosis Date   Arthritis    osteoarthritis -hips,spine,hands and left wrist.   Arthritis    Complication of anesthesia    GERD (gastroesophageal reflux disease)    Pneumonia    PONV (postoperative nausea and vomiting)    Sleep apnea    Thrombocytosis    Unspecified diseases of blood and blood-forming organs    "essential hyperthrombocytosis"   Discharge Diagnoses:   Active Problems:   Primary osteoarthritis of right hip  Estimated body mass index is 25.84 kg/m as calculated from the following:   Height as of this encounter: '5\' 9"'$  (1.753 m).   Weight as of this encounter: 79.4 kg.  Procedure:  Procedure(s) (LRB): TOTAL HIP ARTHROPLASTY ANTERIOR APPROACH (Right)   Consults: None  HPI: Joshua Cabrera is a 65 y.o. male who has advanced end-stage arthritis of their Right  hip with progressively worsening pain and dysfunction.The patient has failed nonoperative management and presents for total hip arthroplasty.    Laboratory Data: Admission on 03/11/2021, Discharged on 03/13/2021  Component Date Value Ref Range Status   Sodium 03/12/2021 133 (A) 135 - 145 mmol/L Final   Potassium 03/12/2021 4.0  3.5 - 5.1 mmol/L Final   Chloride 03/12/2021 103  98 - 111 mmol/L Final   CO2 03/12/2021 25  22 - 32 mmol/L Final   Glucose, Bld 03/12/2021 129 (A) 70 - 99 mg/dL Final   Glucose reference range applies only to samples taken after fasting for at least 8 hours.   BUN 03/12/2021 13  8 - 23 mg/dL Final   Creatinine, Ser 03/12/2021 0.66  0.61 - 1.24 mg/dL Final   Calcium 03/12/2021 8.1 (A) 8.9 - 10.3 mg/dL Final   GFR, Estimated 03/12/2021 >60  >60 mL/min Final   Comment: (NOTE) Calculated using the CKD-EPI Creatinine Equation (2021)     Anion gap 03/12/2021 5  5 - 15 Final   Performed at [facility], [place] [address], [place], [place] [address]   WBC 03/12/2021 12.6 (A) 4.0 - 10.5 K/uL Final   RBC 03/12/2021 2.79 (A) 4.22 - 5.81 MIL/uL Final   Hemoglobin 03/12/2021 8.5 (A) 13.0 - 17.0 g/dL Final   HCT 03/12/2021 26.9 (A) 39.0 - 52.0 % Final   MCV 03/12/2021 96.4  80.0 - 100.0 fL Final   MCH 03/12/2021 30.5  26.0 - 34.0 pg Final   MCHC 03/12/2021 31.6  30.0 - 36.0 g/dL Final   RDW 03/12/2021 14.2  11.5 - 15.5 % Final   Platelets 03/12/2021 140 (A) 150 - 400 K/uL Final   nRBC 03/12/2021 0.0  0.0 - 0.2 % Final   Neutrophils Relative % 03/12/2021 79  % Final   Neutro Abs 03/12/2021 10.0 (A) 1.7 - 7.7 K/uL Final   Lymphocytes Relative 03/12/2021 10  % Final   Lymphs Abs 03/12/2021 1.3  0.7 - 4.0 K/uL Final   Monocytes Relative 03/12/2021 10  % Final   Monocytes Absolute 03/12/2021 1.2 (A) 0.1 - 1.0 K/uL Final   Eosinophils Relative 03/12/2021 0  % Final   Eosinophils Absolute 03/12/2021 0.0  0.0 - 0.5 K/uL Final   Basophils Relative 03/12/2021 0  % Final   Basophils Absolute 03/12/2021 0.0  0.0 - 0.1 K/uL Final   Immature Granulocytes 03/12/2021 1  %  Final   Abs Immature Granulocytes 03/12/2021 0.08 (A) 0.00 - 0.07 K/uL Final   Performed at [facility], [place] [address] [place], [place] [address]   Hemoglobin 03/12/2021 8.5 (A) 13.0 - 17.0 g/dL Final   HCT 03/12/2021 27.5 (A) 39.0 - 52.0 % Final   Performed at [facility], [place] [address], [place], [place] [address]   Sodium 03/13/2021 132 (A) 135 - 145 mmol/L Final   Potassium 03/13/2021 3.6  3.5 - 5.1 mmol/L Final   Chloride 03/13/2021 100  98 - 111 mmol/L Final   CO2 03/13/2021 26  22 - 32 mmol/L Final   Glucose, Bld 03/13/2021 125 (A) 70 - 99 mg/dL Final   Glucose reference range applies only to samples taken after fasting for at least 8 hours.   BUN 03/13/2021 12  8 - 23 mg/dL Final   Creatinine, Ser  03/13/2021 0.58 (A) 0.61 - 1.24 mg/dL Final   Calcium 03/13/2021 8.4 (A) 8.9 - 10.3 mg/dL Final   GFR, Estimated 03/13/2021 >60  >60 mL/min Final   Comment: (NOTE) Calculated using the CKD-EPI Creatinine Equation (2021)    Anion gap 03/13/2021 6  5 - 15 Final   Performed at [facility], [place] [address] [place], [place] [address]   WBC 03/13/2021 14.4 (A) 4.0 - 10.5 K/uL Final   RBC 03/13/2021 2.77 (A) 4.22 - 5.81 MIL/uL Final   Hemoglobin 03/13/2021 8.4 (A) 13.0 - 17.0 g/dL Final   HCT 03/13/2021 26.2 (A) 39.0 - 52.0 % Final   MCV 03/13/2021 94.6  80.0 - 100.0 fL Final   MCH 03/13/2021 30.3  26.0 - 34.0 pg Final   MCHC 03/13/2021 32.1  30.0 - 36.0 g/dL Final   RDW 03/13/2021 14.0  11.5 - 15.5 % Final   Platelets 03/13/2021 174  150 - 400 K/uL Final   nRBC 03/13/2021 0.0  0.0 - 0.2 % Final   Neutrophils Relative % 03/13/2021 78  % Final   Neutro Abs 03/13/2021 11.2 (A) 1.7 - 7.7 K/uL Final   Lymphocytes Relative 03/13/2021 10  % Final   Lymphs Abs 03/13/2021 1.4  0.7 - 4.0 K/uL Final   Monocytes Relative 03/13/2021 11  % Final   Monocytes Absolute 03/13/2021 1.6 (A) 0.1 - 1.0 K/uL Final   Eosinophils Relative 03/13/2021 0  % Final   Eosinophils Absolute 03/13/2021 0.0  0.0 - 0.5 K/uL Final   Basophils Relative 03/13/2021 0  % Final   Basophils Absolute 03/13/2021 0.0  0.0 - 0.1 K/uL Final   Immature Granulocytes 03/13/2021 1  % Final   Abs Immature Granulocytes 03/13/2021 0.12 (A) 0.00 - 0.07 K/uL Final   Performed at [facility], [place] [address], [place], [place] [address]  Hospital Outpatient Visit on 03/09/2021  Component Date Value Ref Range Status   SARS Coronavirus 2 03/09/2021 NEGATIVE  NEGATIVE Final   Comment: (NOTE) SARS-CoV-2 target nucleic acids are NOT DETECTED.  The SARS-CoV-2 RNA is generally detectable in upper and lower respiratory specimens during the acute phase of infection. Negative results do not preclude SARS-CoV-2  infection, do not rule out co-infections with other pathogens, and should not be used as the sole basis for treatment or other patient management decisions. Negative results must be combined with clinical observations, patient history, and epidemiological information. The expected result is Negative.  Fact Sheet for Patients: SugarRoll.be  Fact Sheet for Healthcare Providers: https://www.woods-mathews.com/  This test is not yet approved or cleared  by the [place] and  has been authorized for detection and/or diagnosis of SARS-CoV-2 by FDA under an Emergency Use Authorization (EUA). This EUA will remain  in effect (meaning this test can be used) for the duration of the COVID-19 declaration under Se                          ction 564(b)(1) of the Act, 21 U.S.C. section 360bbb-3(b)(1), unless the authorization is terminated or revoked sooner.  Performed at [facility], [place] [address], [place], [place] [address]   Hospital Outpatient Visit on 02/27/2021  Component Date Value Ref Range Status   MRSA, PCR 02/27/2021 NEGATIVE  NEGATIVE Final   Staphylococcus aureus 02/27/2021 NEGATIVE  NEGATIVE Final   Comment: (NOTE) The Xpert SA Assay (FDA approved for NASAL specimens in patients 28 years of age and older), is one component of a comprehensive surveillance program. It is not intended to diagnose infection nor to guide or monitor treatment. Performed at [facility], [place] [address] [place], [place] [address]    WBC 02/27/2021 6.8  4.0 - 10.5 K/uL Final   RBC 02/27/2021 3.74 (A) 4.22 - 5.81 MIL/uL Final   Hemoglobin 02/27/2021 11.2 (A) 13.0 - 17.0 g/dL Final   HCT 02/27/2021 35.8 (A) 39.0 - 52.0 % Final   MCV 02/27/2021 95.7  80.0 - 100.0 fL Final   MCH 02/27/2021 29.9  26.0 - 34.0 pg Final   MCHC 02/27/2021 31.3  30.0 - 36.0 g/dL Final   RDW 02/27/2021 13.8  11.5 - 15.5 % Final   Platelets 02/27/2021 540  (A) 150 - 400 K/uL Final   nRBC 02/27/2021 0.0  0.0 - 0.2 % Final   Performed at [facility], [place] [address], [place], [place] [address]   Sodium 02/27/2021 135  135 - 145 mmol/L Final   Potassium 02/27/2021 4.5  3.5 - 5.1 mmol/L Final   Chloride 02/27/2021 101  98 - 111 mmol/L Final   CO2 02/27/2021 28  22 - 32 mmol/L Final   Glucose, Bld 02/27/2021 92  70 - 99 mg/dL Final   Glucose reference range applies only to samples taken after fasting for at least 8 hours.   BUN 02/27/2021 18  8 - 23 mg/dL Final   Creatinine, Ser 02/27/2021 0.84  0.61 - 1.24 mg/dL Final   Calcium 02/27/2021 9.2  8.9 - 10.3 mg/dL Final   Total Protein 02/27/2021 7.1  6.5 - 8.1 g/dL Final   Albumin 02/27/2021 3.8  3.5 - 5.0 g/dL Final   AST 02/27/2021 20  15 - 41 U/L Final   ALT 02/27/2021 15  0 - 44 U/L Final   Alkaline Phosphatase 02/27/2021 53  38 - 126 U/L Final   Total Bilirubin 02/27/2021 1.2  0.3 - 1.2 mg/dL Final   GFR, Estimated 02/27/2021 >60  >60 mL/min Final   Comment: (NOTE) Calculated using the CKD-EPI Creatinine Equation (2021)    Anion gap 02/27/2021 6  5 - 15 Final   Performed at [facility], [place] [address], [place], [place] [address]   Prothrombin Time 02/27/2021 12.6  11.4 - 15.2 seconds Final   INR 02/27/2021 0.9  0.8 - 1.2 Final   Comment: (NOTE) INR goal varies based on device and disease states. Performed at [facility], [place] [address], [place], [place] [address]    ABO/RH(D) 02/27/2021 O POS   Final  Antibody Screen 02/27/2021 NEG   Final   Sample Expiration 02/27/2021 03/13/2021,2359   Final   Extend sample reason 02/27/2021    Final                   Value:NO TRANSFUSIONS OR PREGNANCY IN THE PAST 3 MONTHS Performed at [place] [address], [place], [place] [address]      X-Rays:DG Pelvis Portable  Result Date: 03/11/2021 CLINICAL DATA:  Total right hip arthroplasty. EXAM: PORTABLE PELVIS  1-2 VIEWS COMPARISON:  Right hip same day.  CT 04/25/2020. FINDINGS: Total right hip replacement. Hardware intact. Anatomic alignment. Prior total left hip replacement. Stable subchondral left acetabular cystic change. Pelvic calcifications consistent phleboliths. IMPRESSION: Total right hip replacement.  Hardware intact.  Anatomic alignment. Electronically Signed   By: Marcello Moores  Register   On: 03/11/2021 11:17   DG C-Arm 1-60 Min-No Report  Result Date: 03/11/2021 CLINICAL DATA:  Intraoperative imaging for right hip replacement. EXAM: OPERATIVE RIGHT HIP (WITH PELVIS IF PERFORMED) 1 VIEWS TECHNIQUE: Fluoroscopic spot image(s) were submitted for interpretation post-operatively. COMPARISON:  CT abdomen and pelvis 04/25/2020. FINDINGS: Single fluoroscopic intraoperative spot view of the right hip demonstrates a total arthroplasty in place. No acute abnormality is identified. IMPRESSION: Intraoperative imaging for right hip replacement.  No acute finding. Electronically Signed   By: Inge Rise M.D.   On: 03/11/2021 12:38   DG HIP OPERATIVE UNILAT W OR W/O PELVIS RIGHT  Result Date: 03/11/2021 CLINICAL DATA:  Intraoperative imaging for right hip replacement. EXAM: OPERATIVE RIGHT HIP (WITH PELVIS IF PERFORMED) 1 VIEWS TECHNIQUE: Fluoroscopic spot image(s) were submitted for interpretation post-operatively. COMPARISON:  CT abdomen and pelvis 04/25/2020. FINDINGS: Single fluoroscopic intraoperative spot view of the right hip demonstrates a total arthroplasty in place. No acute abnormality is identified. IMPRESSION: Intraoperative imaging for right hip replacement.  No acute finding. Electronically Signed   By: Inge Rise M.D.   On: 03/11/2021 12:38    EKG:No orders found for this or any previous visit.   Hospital Course: CIAN LABRIE is a 65 y.o. who was admitted to [facility]. They were brought to the operating room on 03/11/2021 and underwent Procedure(s): [place].  Patient tolerated the procedure well and was later transferred to the recovery room and then to the orthopaedic floor for postoperative care. They were given PO and IV analgesics for pain control following their surgery. They were given 24 hours of postoperative antibiotics of  Anti-infectives (From admission, onward)    Start     Dose/Rate Route Frequency Ordered Stop   03/11/21 1152  sodium chloride 0.9 % with ceFAZolin (ANCEF) ADS Med       Note to Pharmacy: Domingo Mend   : cabinet override      03/11/21 1152 03/11/21 2359   03/11/21 1100  ceFAZolin (ANCEF) 2 g in sodium chloride 0.9 % 100 mL IVPB        2 g 200 mL/hr over 30 Minutes Intravenous Every 6 hours 03/11/21 1051 03/11/21 1859   03/11/21 0615  ceFAZolin (ANCEF) 2 g in sodium chloride 0.9 % 100 mL IVPB        2 g 200 mL/hr over 30 Minutes Intravenous On call to O.R. 03/11/21 [id number] 03/11/21 0805      and started on DVT prophylaxis in the form of Xarelto.   PT and OT were ordered for total joint protocol. Discharge planning consulted to help with postop disposition and  equipment needs. Patient had a decent night on the evening of surgery. They started to get up OOB with therapy on POD #1. Hemoglobin 8.5 this AM, rechecked and found to be stable at 12:00 that day. Had complaints of indigestion of the morning of day one, PO protonix was ordered with relief. Continued to work with therapy into POD #2. Pt was seen during rounds on day two and was ready to go home pending progress with therapy. Dressing was checked and the incision was clean, dry, and intact. Pt worked with therapy for one additional session and was meeting their goals. He was discharged to home later that day in stable condition.  Diet: Regular diet Activity: WBAT Follow-up: in 2 weeks Disposition: Home Discharged Condition: stable   Discharge Instructions     Call MD / Call 911   Complete by: As directed    If you experience chest pain or shortness  of breath, CALL 911 and be transported to the hospital emergency room.  If you develope a fever above 101 F, pus (white drainage) or increased drainage or redness at the wound, or calf pain, call your surgeon's office.   Change dressing   Complete by: As directed    You have an adhesive waterproof bandage over the incision. Leave this in place until your first follow-up appointment. Once you remove this you will not need to place another bandage.   Constipation Prevention   Complete by: As directed    Drink plenty of fluids.  Prune juice may be helpful.  You may use a stool softener, such as Colace (over the counter) 100 mg twice a day.  Use MiraLax (over the counter) for constipation as needed.   Diet - low sodium heart healthy   Complete by: As directed    Do not sit on low chairs, stoools or toilet seats, as it may be difficult to get up from low surfaces   Complete by: As directed    Driving restrictions   Complete by: As directed    No driving for two weeks   Post-operative opioid taper instructions:   Complete by: As directed    POST-OPERATIVE OPIOID TAPER INSTRUCTIONS: It is important to wean off of your opioid medication as soon as possible. If you do not need pain medication after your surgery it is ok to stop day one. Opioids include: Codeine, Hydrocodone(Norco, Vicodin), Oxycodone(Percocet, oxycontin) and hydromorphone amongst others.  Long term and even short term use of opiods can cause: Increased pain response Dependence Constipation Depression Respiratory depression And more.  Withdrawal symptoms can include Flu like symptoms Nausea, vomiting And more Techniques to manage these symptoms Hydrate well Eat regular healthy meals Stay active Use relaxation techniques(deep breathing, meditating, yoga) Do Not substitute Alcohol to help with tapering If you have been on opioids for less than two weeks and do not have pain than it is ok to stop all together.  Plan to wean  off of opioids This plan should start within one week post op of your joint replacement. Maintain the same interval or time between taking each dose and first decrease the dose.  Cut the total daily intake of opioids by one tablet each day Next start to increase the time between doses. The last dose that should be eliminated is the evening dose.      TED hose   Complete by: As directed    Use stockings (TED hose) for three weeks on both leg(s).  You may remove them  at night for sleeping.   Weight bearing as tolerated   Complete by: As directed       Allergies as of 03/13/2021       Reactions   Hornet Venom Anaphylaxis   Wasp Venom Protein Anaphylaxis   Vicodin [hydrocodone-acetaminophen] Nausea And Vomiting   Other Other (See Comments), Swelling   Numbness and tingling in arms and hands.    Penicillins Rash   Tolerated Cephalosporin Date: 03/12/21.        Medication List     STOP taking these medications    albuterol 108 (90 Base) MCG/ACT inhaler Commonly known as: VENTOLIN HFA   aspirin 81 MG tablet   ibuprofen 200 MG tablet Commonly known as: ADVIL   montelukast 10 MG tablet Commonly known as: SINGULAIR   multivitamin with minerals Tabs tablet       TAKE these medications    cyclobenzaprine 5 MG tablet Commonly known as: FLEXERIL Take 2 tablets (10 mg total) by mouth 3 (three) times daily as needed for muscle spasms.   EPINEPHrine 0.3 mg/0.3 mL Soaj injection Commonly known as: EPI-PEN Inject 0.3 mg into the muscle as needed for anaphylaxis.   ferrous sulfate 325 (65 FE) MG EC tablet Take 1 tablet (325 mg total) by mouth 2 (two) times daily for 14 days.   fluticasone 50 MCG/ACT nasal spray Commonly known as: FLONASE Place 1 spray into both nostrils daily.   hydroxyurea 500 MG capsule Commonly known as: HYDREA TAKE TWO CAPSULES DAILY ON MONDAYS, WEDNESDAYS, AND FRIDAYS. TAKE ONLY ONE CAPSULE DAILY ON OTHER DAYS OF THE WEEK What changed:  additional instructions   loratadine 10 MG tablet Commonly known as: CLARITIN Take 10 mg by mouth daily.   methocarbamol 500 MG tablet Commonly known as: ROBAXIN Take 1 tablet (500 mg total) by mouth every 6 (six) hours as needed for muscle spasms.   ondansetron 4 MG tablet Commonly known as: ZOFRAN Take 1 tablet (4 mg total) by mouth every 6 (six) hours as needed for nausea.   oxyCODONE 5 MG immediate release tablet Commonly known as: Oxy IR/ROXICODONE Take 1-2 tablets (5-10 mg total) by mouth every 6 (six) hours as needed for severe pain.   rivaroxaban 10 MG Tabs tablet Commonly known as: XARELTO Take 1 tablet (10 mg total) by mouth daily with breakfast for 20 days. Then resume one 81 mg aspirin once a day   sildenafil 20 MG tablet Commonly known as: REVATIO Take 60 mg by mouth daily as needed (ED).   traMADol 50 MG tablet Commonly known as: ULTRAM Take 1-2 tablets (50-100 mg total) by mouth every 6 (six) hours as needed for moderate pain.               Discharge Care Instructions  (From admission, onward)           Start     Ordered   03/12/21 0000  Weight bearing as tolerated        03/12/21 0738   03/12/21 0000  Change dressing       Comments: You have an adhesive waterproof bandage over the incision. Leave this in place until your first follow-up appointment. Once you remove this you will not need to place another bandage.   03/12/21 [id number]            Follow-up Information     Gaynelle Arabian, MD Follow up.   Specialty: Orthopedic Surgery Why: You have an appointment scheduled for 03/24/21 at 4:00 pm Contact  information: [address] [place] [address] [id number]                 Signed: Theresa Duty, PA-C Orthopedic Surgery 03/16/2021, 8:00 AM

## 2021-05-07 ENCOUNTER — Other Ambulatory Visit: Payer: Self-pay | Admitting: *Deleted

## 2021-05-07 DIAGNOSIS — D473 Essential (hemorrhagic) thrombocythemia: Secondary | ICD-10-CM

## 2021-05-12 ENCOUNTER — Inpatient Hospital Stay (HOSPITAL_BASED_OUTPATIENT_CLINIC_OR_DEPARTMENT_OTHER): Payer: BC Managed Care – PPO | Admitting: Nurse Practitioner

## 2021-05-12 ENCOUNTER — Encounter: Payer: Self-pay | Admitting: Nurse Practitioner

## 2021-05-12 ENCOUNTER — Inpatient Hospital Stay: Payer: BC Managed Care – PPO | Attending: Internal Medicine

## 2021-05-12 VITALS — BP 103/71 | HR 72 | Temp 97.3°F | Resp 18 | Wt 181.9 lb

## 2021-05-12 DIAGNOSIS — K625 Hemorrhage of anus and rectum: Secondary | ICD-10-CM | POA: Insufficient documentation

## 2021-05-12 DIAGNOSIS — Z809 Family history of malignant neoplasm, unspecified: Secondary | ICD-10-CM | POA: Diagnosis not present

## 2021-05-12 DIAGNOSIS — D649 Anemia, unspecified: Secondary | ICD-10-CM | POA: Insufficient documentation

## 2021-05-12 DIAGNOSIS — M25551 Pain in right hip: Secondary | ICD-10-CM | POA: Insufficient documentation

## 2021-05-12 DIAGNOSIS — Z823 Family history of stroke: Secondary | ICD-10-CM | POA: Diagnosis not present

## 2021-05-12 DIAGNOSIS — Z79899 Other long term (current) drug therapy: Secondary | ICD-10-CM | POA: Diagnosis not present

## 2021-05-12 DIAGNOSIS — Z885 Allergy status to narcotic agent status: Secondary | ICD-10-CM | POA: Insufficient documentation

## 2021-05-12 DIAGNOSIS — M2550 Pain in unspecified joint: Secondary | ICD-10-CM | POA: Diagnosis not present

## 2021-05-12 DIAGNOSIS — Z880 Allergy status to penicillin: Secondary | ICD-10-CM | POA: Insufficient documentation

## 2021-05-12 DIAGNOSIS — D473 Essential (hemorrhagic) thrombocythemia: Secondary | ICD-10-CM | POA: Diagnosis present

## 2021-05-12 DIAGNOSIS — D75839 Thrombocytosis, unspecified: Secondary | ICD-10-CM | POA: Insufficient documentation

## 2021-05-12 LAB — CBC WITH DIFFERENTIAL/PLATELET
Abs Immature Granulocytes: 0.03 10*3/uL (ref 0.00–0.07)
Basophils Absolute: 0.2 10*3/uL — ABNORMAL HIGH (ref 0.0–0.1)
Basophils Relative: 2 %
Eosinophils Absolute: 0.2 10*3/uL (ref 0.0–0.5)
Eosinophils Relative: 3 %
HCT: 28.3 % — ABNORMAL LOW (ref 39.0–52.0)
Hemoglobin: 8.5 g/dL — ABNORMAL LOW (ref 13.0–17.0)
Immature Granulocytes: 0 %
Lymphocytes Relative: 27 %
Lymphs Abs: 2.1 10*3/uL (ref 0.7–4.0)
MCH: 26.5 pg (ref 26.0–34.0)
MCHC: 30 g/dL (ref 30.0–36.0)
MCV: 88.2 fL (ref 80.0–100.0)
Monocytes Absolute: 0.9 10*3/uL (ref 0.1–1.0)
Monocytes Relative: 12 %
Neutro Abs: 4.2 10*3/uL (ref 1.7–7.7)
Neutrophils Relative %: 56 %
Platelets: 52 10*3/uL — ABNORMAL LOW (ref 150–400)
RBC: 3.21 MIL/uL — ABNORMAL LOW (ref 4.22–5.81)
RDW: 15.7 % — ABNORMAL HIGH (ref 11.5–15.5)
WBC: 7.6 10*3/uL (ref 4.0–10.5)
nRBC: 0 % (ref 0.0–0.2)

## 2021-05-12 LAB — COMPREHENSIVE METABOLIC PANEL
ALT: 14 U/L (ref 0–44)
AST: 19 U/L (ref 15–41)
Albumin: 3.9 g/dL (ref 3.5–5.0)
Alkaline Phosphatase: 67 U/L (ref 38–126)
Anion gap: 9 (ref 5–15)
BUN: 19 mg/dL (ref 8–23)
CO2: 25 mmol/L (ref 22–32)
Calcium: 9.1 mg/dL (ref 8.9–10.3)
Chloride: 101 mmol/L (ref 98–111)
Creatinine, Ser: 0.92 mg/dL (ref 0.61–1.24)
GFR, Estimated: >60 mL/min (ref >60)
Glucose, Bld: 101 mg/dL — ABNORMAL HIGH (ref 70–99)
Potassium: 3.9 mmol/L (ref 3.5–5.1)
Sodium: 135 mmol/L (ref 135–145)
Total Bilirubin: 0.7 mg/dL (ref 0.3–1.2)
Total Protein: 7.2 g/dL (ref 6.5–8.1)

## 2021-05-12 LAB — LACTATE DEHYDROGENASE: LDH: 121 U/L (ref 98–192)

## 2021-05-12 NOTE — Progress Notes (Signed)
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 On zantac.    # Left hip replacement ------------------------------------------------   DIAGNOSIS: [ ]   STAGE:         ;GOALS:  CURRENT/MOST RECENT THERAPY [ ]       Essential thrombocythemia ([place])     INTERVAL HISTORY: Patient is very pleasant 65 year old male with history of essential thrombocytosis, currently on hydrea who returns to clinic for labs and follow up. Rectal bleeding has resolved. He underwent right hip replacement in July. Continues to have pain and using a cane for ambulation. No recent bleeding or bruising. No fevers, chills, abnormal weight loss, or night sweats. No recent infections. Tolerating hydrea well without significant side effects.   Review of Systems  Constitutional:  Negative for chills, diaphoresis, fever, malaise/fatigue and weight loss.  HENT:  Negative for nosebleeds and sore throat.   Eyes:  Negative for double vision.  Respiratory:  Negative for cough, hemoptysis, sputum production, shortness of breath and wheezing.   Cardiovascular:  Negative for chest pain, palpitations, orthopnea and leg swelling.  Gastrointestinal:  Negative for abdominal pain, blood in stool, constipation, diarrhea, heartburn, melena, nausea and vomiting.  Genitourinary:  Negative for dysuria, frequency and urgency.  Musculoskeletal:  Positive for joint pain. Negative for back pain.  Skin: Negative.  Negative for itching and rash.  Neurological:  Negative for dizziness, tingling, focal weakness, weakness and  headaches.  Endo/Heme/Allergies:  Does not bruise/bleed easily.  Psychiatric/Behavioral:  Negative for depression. The patient is not nervous/anxious and does not have insomnia.      PAST MEDICAL HISTORY :  Past Medical History:  Diagnosis Date   Arthritis    osteoarthritis -hips,spine,hands and left wrist.   Arthritis    Complication of anesthesia    GERD (gastroesophageal reflux disease)    Pneumonia    PONV (postoperative nausea and vomiting)    Thrombocytosis (H[facility])    Unspecified diseases of blood and blood-forming organs    "essential hyperthrombocytosis"    PAST SURGICAL HISTORY :   Past Surgical History:  Procedure Laterality Date   BACK SURGERY  June 2015   ROTATOR CUFF REPAIR     x3(lt x2), rt. x1   TOTAL HIP ARTHROPLASTY Left 02/11/2014   Procedure: LEFT TOTAL HIP ARTHROPLASTY ANTERIOR APPROACH;  Surgeon: Gearlean Alf, MD;  Location: [facility];  Service: Orthopedics;  Laterality: Left;    FAMILY HISTORY :   Family History  Problem Relation Age of Onset   Cancer Maternal Aunt    Cancer Paternal Aunt    Stroke Maternal Grandfather     SOCIAL HISTORY:   Social History   Tobacco Use   Smoking status: Never Smoker   Smokeless tobacco: Never Used  Substance Use Topics   Alcohol use: Yes    Alcohol/week: 2.0 standard drinks    Types: 2 Cans of beer per week    Comment: beer/ wine 2glasses  per night   Drug use: No    ALLERGIES:  is allergic to vicodin [hydrocodone-acetaminophen], other, and penicillins.  MEDICATIONS:  Current Outpatient Medications  Medication Sig Dispense Refill  aspirin 81 MG tablet Take 81 mg by mouth daily.     EPINEPHrine 0.3 mg/0.3 mL IJ SOAJ injection INJECT INTRAMUSCULARLY AS DIRECTED  0   hydroxyurea (HYDREA) 500 MG capsule TAKE TWO CAPSULES DAILY ON MONDAYS, WEDNESDAYS, AND FRIDAYS. TAKE ONLY ONE CAPSULE ON OTHER DAYS OF THE WEEK 45 capsule 12   ibuprofen (ADVIL,MOTRIN) 800 MG tablet Take 800 mg by mouth every 8 (eight) hours  as needed.     loratadine (CLARITIN) 10 MG tablet Take 10 mg by mouth daily.     VIAGRA 100 MG tablet Take 100 mg by mouth as needed.   0   No current facility-administered medications for this visit.    PHYSICAL EXAMINATION: ECOG PERFORMANCE STATUS: 0 - Asymptomatic  BP 107/75 (BP Location: Right Arm, Patient Position: Sitting, Cuff Size: Normal)   Pulse 68   Temp 97.7 F (36.5 C) (Tympanic)   Resp 16   Ht 5\' 10"  (1.778 m)   Wt 184 lb 8 oz (83.7 kg)   SpO2 98%   BMI 26.47 kg/m   Filed Weights   05/12/20 1438  Weight: 184 lb 8 oz (83.7 kg)    Physical Exam Constitutional:      Appearance: He is not ill-appearing.  Eyes:     General: No scleral icterus.    Conjunctiva/sclera: Conjunctivae normal.  Cardiovascular:     Rate and Rhythm: Normal rate and regular rhythm.  Pulmonary:     Effort: Pulmonary effort is normal.     Breath sounds: Normal breath sounds.  Abdominal:     General: There is no distension.     Palpations: Abdomen is soft.     Tenderness: There is no abdominal tenderness. There is no guarding.  Musculoskeletal:        General: No deformity.     Right lower leg: No edema.     Left lower leg: No edema.     Comments: Cane for ambulation  Lymphadenopathy:     Cervical: No cervical adenopathy.  Skin:    General: Skin is warm and dry.  Neurological:     Mental Status: He is alert and oriented to person, place, and time. Mental status is at baseline.  Psychiatric:        Mood and Affect: Mood normal.        Behavior: Behavior normal.   LABORATORY DATA:  I have reviewed the data as listed    Component Value Date/Time   NA 135 05/12/2020 1423   K 4.0 05/12/2020 1423   CL 101 05/12/2020 1423   CO2 28 05/12/2020 1423   GLUCOSE 102 (H) 05/12/2020 1423   BUN 19 05/12/2020 1423   CREATININE 0.85 05/12/2020 1423   CREATININE 0.86 02/28/2014 1450   CALCIUM 8.4 (L) 05/12/2020 1423   PROT 6.7 05/12/2020 1423   PROT 7.4 02/28/2014 1450   ALBUMIN 3.8  05/12/2020 1423   ALBUMIN 3.1 (L) 02/28/2014 1450   AST 21 05/12/2020 1423   AST 16 02/28/2014 1450   ALT 19 05/12/2020 1423   ALT 40 02/28/2014 1450   ALKPHOS 65 05/12/2020 1423   ALKPHOS 109 02/28/2014 1450   BILITOT 1.2 05/12/2020 1423   BILITOT 0.5 02/28/2014 1450   GFRNONAA >60 05/12/2020 1423   GFRNONAA >60 02/28/2014 1450   GFRAA >60 05/12/2020 1423   GFRAA >60 02/28/2014 1450   No results found for: SPEP, UPEP  Lab Results  Component Value Date   WBC 7.2 05/12/2020   NEUTROABS 3.8 05/12/2020  HGB 14.7 05/12/2020   HCT 41.6 05/12/2020   MCV 103.5 (H) 05/12/2020   PLT 433 (H) 05/12/2020      Chemistry      Component Value Date/Time   NA 135 05/12/2020 1423   K 4.0 05/12/2020 1423   CL 101 05/12/2020 1423   CO2 28 05/12/2020 1423   BUN 19 05/12/2020 1423   CREATININE 0.85 05/12/2020 1423   CREATININE 0.86 02/28/2014 1450      Component Value Date/Time   CALCIUM 8.4 (L) 05/12/2020 1423   ALKPHOS 65 05/12/2020 1423   ALKPHOS 109 02/28/2014 1450   AST 21 05/12/2020 1423   AST 16 02/28/2014 1450   ALT 19 05/12/2020 1423   ALT 40 02/28/2014 1450   BILITOT 1.2 05/12/2020 1423   BILITOT 0.5 02/28/2014 1450      ASSESSMENT & PLAN:   Essential Thrombocytopenia- intermediate risk. JAK-2 positive. Currently on hydrea 500 mg T/Th/Sa/Su and 1000 mg M/W/F, on aspirin 81 mg daily. Platelets today, 52; worse. LDH stable. Discussed with Dr. Rogue Bussing who recommends holding hydrea. Plan to check counts weekly and see Dr. Rogue Bussing in 2 weeks.  Anemia- hemoglobin 8.5. Monitor.  Right hip pain- follow up with ortho Rectal bleeding- s/p flex sig. Resolved.   Disposition:  1 week- cbc 2 weeks- cbc & see Dr. Ria Comment, [place], AGNP-C [place] [facility] [phone] (clinic)

## 2021-05-12 NOTE — Progress Notes (Signed)
Pt states he had a hip replacement at the end of July; hip has not seem to heal properly pt states he is no longer suppose to be using the cane; however still using it and in constant sharp pain.

## 2021-05-19 ENCOUNTER — Inpatient Hospital Stay: Payer: BC Managed Care – PPO | Attending: Internal Medicine

## 2021-05-19 ENCOUNTER — Other Ambulatory Visit: Payer: BC Managed Care – PPO

## 2021-05-19 DIAGNOSIS — K921 Melena: Secondary | ICD-10-CM | POA: Diagnosis not present

## 2021-05-19 DIAGNOSIS — D509 Iron deficiency anemia, unspecified: Secondary | ICD-10-CM | POA: Insufficient documentation

## 2021-05-19 DIAGNOSIS — R5383 Other fatigue: Secondary | ICD-10-CM | POA: Insufficient documentation

## 2021-05-19 DIAGNOSIS — Z809 Family history of malignant neoplasm, unspecified: Secondary | ICD-10-CM | POA: Insufficient documentation

## 2021-05-19 DIAGNOSIS — K649 Unspecified hemorrhoids: Secondary | ICD-10-CM | POA: Insufficient documentation

## 2021-05-19 DIAGNOSIS — Z79899 Other long term (current) drug therapy: Secondary | ICD-10-CM | POA: Diagnosis not present

## 2021-05-19 DIAGNOSIS — Z823 Family history of stroke: Secondary | ICD-10-CM | POA: Insufficient documentation

## 2021-05-19 DIAGNOSIS — D473 Essential (hemorrhagic) thrombocythemia: Secondary | ICD-10-CM | POA: Diagnosis not present

## 2021-05-19 LAB — CBC WITH DIFFERENTIAL/PLATELET
Abs Immature Granulocytes: 0.06 10*3/uL (ref 0.00–0.07)
Basophils Absolute: 0.2 10*3/uL — ABNORMAL HIGH (ref 0.0–0.1)
Basophils Relative: 1 %
Eosinophils Absolute: 0.2 10*3/uL (ref 0.0–0.5)
Eosinophils Relative: 2 %
HCT: 27.7 % — ABNORMAL LOW (ref 39.0–52.0)
Hemoglobin: 8.2 g/dL — ABNORMAL LOW (ref 13.0–17.0)
Immature Granulocytes: 1 %
Lymphocytes Relative: 14 %
Lymphs Abs: 1.7 10*3/uL (ref 0.7–4.0)
MCH: 26.1 pg (ref 26.0–34.0)
MCHC: 29.6 g/dL — ABNORMAL LOW (ref 30.0–36.0)
MCV: 88.2 fL (ref 80.0–100.0)
Monocytes Absolute: 1.6 10*3/uL — ABNORMAL HIGH (ref 0.1–1.0)
Monocytes Relative: 13 %
Neutro Abs: 8.7 10*3/uL — ABNORMAL HIGH (ref 1.7–7.7)
Neutrophils Relative %: 69 %
Platelets: 610 10*3/uL — ABNORMAL HIGH (ref 150–400)
RBC: 3.14 MIL/uL — ABNORMAL LOW (ref 4.22–5.81)
RDW: 16 % — ABNORMAL HIGH (ref 11.5–15.5)
WBC: 12.5 10*3/uL — ABNORMAL HIGH (ref 4.0–10.5)
nRBC: 0 % (ref 0.0–0.2)

## 2021-05-20 ENCOUNTER — Other Ambulatory Visit: Payer: Self-pay | Admitting: Nurse Practitioner

## 2021-05-20 DIAGNOSIS — D649 Anemia, unspecified: Secondary | ICD-10-CM

## 2021-05-20 NOTE — Progress Notes (Signed)
Spoke to Dr. Rogue Bussing regarding bloodwork results. He recommends holding hydrea. Additional lab orders entered. Spoke to patient. Follow up planned for 10/11.

## 2021-05-25 ENCOUNTER — Other Ambulatory Visit: Payer: Self-pay

## 2021-05-25 ENCOUNTER — Ambulatory Visit: Payer: BC Managed Care – PPO | Admitting: Medical

## 2021-05-25 ENCOUNTER — Encounter: Payer: Self-pay | Admitting: Medical

## 2021-05-25 VITALS — BP 105/61 | HR 97 | Temp 98.9°F | Resp 16 | Ht 70.0 in | Wt 185.8 lb

## 2021-05-25 DIAGNOSIS — R051 Acute cough: Secondary | ICD-10-CM

## 2021-05-25 DIAGNOSIS — M62838 Other muscle spasm: Secondary | ICD-10-CM

## 2021-05-25 LAB — POC COVID19 BINAXNOW: SARS Coronavirus 2 Ag: NEGATIVE

## 2021-05-25 MED ORDER — ALBUTEROL SULFATE HFA 108 (90 BASE) MCG/ACT IN AERS: 2.0000 | INHALATION_SPRAY | Freq: Four times a day (QID) | RESPIRATORY_TRACT | 2 refills | Status: DC | PRN

## 2021-05-25 MED ORDER — BENZONATATE 100 MG PO CAPS: 100.0000 mg | ORAL_CAPSULE | Freq: Three times a day (TID) | ORAL | 0 refills | Status: DC | PRN

## 2021-05-25 NOTE — Progress Notes (Signed)
Subjective:    Patient ID: Joshua Cabrera, male    DOB: 08-15-1956, 65 y.o.   MRN: [id number]  HPI 65 yo male in non acute distress would like Covid-19 test , he is visiting his father in [place] and wants to make sure he does not have  the infection.  Coughing since June, did mention it to  his ENT MD who thought it was not  PND.   Springtime he did well. June started with cough and nasal discharge. Allergy injections weekly and once a month for wasps/hornet spray  Also  a spasm in his trapezius both side , has taken cyclobenzaprine and Ibuprofen 800 mg .  And feels much better now.  Hip replacement on the right  recently ,  hip left  replacement 2015.  Walking with a cane currently  Low platetet count not taking hydroxyurea.  Cough has felt a little more bronchial.  Allergies  Allergen Reactions   Hornet Venom Anaphylaxis   Wasp Venom Protein Anaphylaxis   Vicodin [Hydrocodone-Acetaminophen] Nausea And Vomiting   Other Other (See Comments) and Swelling    Numbness and tingling in arms and hands.    Penicillins Rash    Tolerated Cephalosporin Date: 03/12/21.      Review of Systems  Constitutional:  Negative for chills and fever.  HENT:  Negative for congestion, ear discharge and sore throat.   Respiratory:  Positive for cough. Negative for shortness of breath and wheezing.   Cardiovascular:  Negative for chest pain.  Musculoskeletal:  Negative for myalgias.  Neurological:  Negative for dizziness, syncope and headaches.        Objective:   Physical Exam Constitutional:      Comments: pale  HENT:     Head: Normocephalic and atraumatic.     Right Ear: Tympanic membrane, ear canal and external ear normal.     Left Ear: Tympanic membrane, ear canal and external ear normal.     Mouth/Throat:     Mouth: Mucous membranes are moist.     Pharynx: Oropharynx is clear.  Eyes:     Extraocular Movements: Extraocular movements intact.     Conjunctiva/sclera:  Conjunctivae normal.     Pupils: Pupils are equal, round, and reactive to light.  Cardiovascular:     Rate and Rhythm: Normal rate and regular rhythm.  Pulmonary:     Effort: Pulmonary effort is normal.     Breath sounds: Normal breath sounds.  Musculoskeletal:     Cervical back: Normal range of motion and neck supple.     Comments: Walking with cane gingerly gets up on exam table.  Skin:    General: Skin is warm and dry.  Neurological:     General: No focal deficit present.     Mental Status: He is alert and oriented to person, place, and time.  Psychiatric:        Mood and Affect: Mood normal.        Behavior: Behavior normal.        Thought Content: Thought content normal.        Judgment: Judgment normal.      Covid-19 test is negative.  Assessment & Plan:  Encounter for Covid -19 test screening Cough Muscle spasm trapezius bilateral.Continue cyclobenzaprine and ibuprofen as prescribed.  Meds ordered this encounter  Medications   albuterol (VENTOLIN HFA) 108 (90 Base) MCG/ACT inhaler    Sig: Inhale 2 puffs into the lungs every 6 (six) hours as needed for wheezing or  shortness of breath.    Dispense:  8 g    Refill:  2   benzonatate (TESSALON PERLES) 100 MG capsule    Sig: Take 1 capsule (100 mg total) by mouth 3 (three) times daily as needed for cough.    Dispense:  20 capsule    Refill:  0   Follow up with your doctor in 3-5 days if not improving. Patient verbalizes understanding and has no questions at discharge.

## 2021-05-26 ENCOUNTER — Encounter: Payer: Self-pay | Admitting: Internal Medicine

## 2021-05-26 ENCOUNTER — Inpatient Hospital Stay: Payer: BC Managed Care – PPO | Admitting: Internal Medicine

## 2021-05-26 ENCOUNTER — Inpatient Hospital Stay (HOSPITAL_BASED_OUTPATIENT_CLINIC_OR_DEPARTMENT_OTHER): Payer: BC Managed Care – PPO | Admitting: Internal Medicine

## 2021-05-26 DIAGNOSIS — D649 Anemia, unspecified: Secondary | ICD-10-CM | POA: Insufficient documentation

## 2021-05-26 DIAGNOSIS — D473 Essential (hemorrhagic) thrombocythemia: Secondary | ICD-10-CM

## 2021-05-26 LAB — CBC WITH DIFFERENTIAL/PLATELET
Abs Immature Granulocytes: 0.11 10*3/uL — ABNORMAL HIGH (ref 0.00–0.07)
Basophils Absolute: 0.2 10*3/uL — ABNORMAL HIGH (ref 0.0–0.1)
Basophils Relative: 1 %
Eosinophils Absolute: 0.4 10*3/uL (ref 0.0–0.5)
Eosinophils Relative: 3 %
HCT: 27.7 % — ABNORMAL LOW (ref 39.0–52.0)
Hemoglobin: 8 g/dL — ABNORMAL LOW (ref 13.0–17.0)
Immature Granulocytes: 1 %
Lymphocytes Relative: 19 %
Lymphs Abs: 2 10*3/uL (ref 0.7–4.0)
MCH: 25.1 pg — ABNORMAL LOW (ref 26.0–34.0)
MCHC: 28.9 g/dL — ABNORMAL LOW (ref 30.0–36.0)
MCV: 86.8 fL (ref 80.0–100.0)
Monocytes Absolute: 1.5 10*3/uL — ABNORMAL HIGH (ref 0.1–1.0)
Monocytes Relative: 15 %
Neutro Abs: 6.3 10*3/uL (ref 1.7–7.7)
Neutrophils Relative %: 61 %
Platelets: 1102 10*3/uL (ref 150–400)
RBC: 3.19 MIL/uL — ABNORMAL LOW (ref 4.22–5.81)
RDW: 16.1 % — ABNORMAL HIGH (ref 11.5–15.5)
Smear Review: INCREASED
WBC: 10.5 10*3/uL (ref 4.0–10.5)
nRBC: 0 % (ref 0.0–0.2)

## 2021-05-26 LAB — IRON AND TIBC
Iron: 13 ug/dL — ABNORMAL LOW (ref 45–182)
Saturation Ratios: 3 % — ABNORMAL LOW (ref 17.9–39.5)
TIBC: 448 ug/dL (ref 250–450)
UIBC: 435 ug/dL

## 2021-05-26 LAB — RETIC PANEL
Immature Retic Fract: 29.5 % — ABNORMAL HIGH (ref 2.3–15.9)
RBC.: 3.19 MIL/uL — ABNORMAL LOW (ref 4.22–5.81)
Retic Count, Absolute: 57.4 10*3/uL (ref 19.0–186.0)
Retic Ct Pct: 1.8 % (ref 0.4–3.1)
Reticulocyte Hemoglobin: 21.2 pg — ABNORMAL LOW (ref >27.9)

## 2021-05-26 LAB — PATHOLOGIST SMEAR REVIEW

## 2021-05-26 LAB — FERRITIN: Ferritin: 6 ng/mL — ABNORMAL LOW (ref 24–336)

## 2021-05-26 NOTE — Progress Notes (Signed)
Duplicate encounter

## 2021-05-26 NOTE — Patient Instructions (Addendum)
#   Start hydrea 500 mg once a day  # HOLD off advil/ ibuprofen; instead take tramadol/ flexeril as recommended. Ok to Take tylenol every 8 hours as needed for pain.

## 2021-05-26 NOTE — Assessment & Plan Note (Addendum)
#   Essential thrombocytosis-Intermediate risk-  JAK-2 positive; currently OFF hydrea sec to worsening anemia-1102. Recommend starting go back- Hydrea 500 mg one a day. Await Iron studies- ? If low would recommend IV iron.   # Worsening anemia: Hb 8.0 s/p hip surgery [3 months;]- s/p eval 2 weeks ago.   # DISPOSITION: # Follow up TBD-Dr.B

## 2021-05-27 NOTE — Progress Notes (Signed)
Left couple of messages for Pt re: need for Iron infusion.   Heather/ Colette- please follow up on my prior follow instructions/ plan.  Gb

## 2021-06-01 ENCOUNTER — Inpatient Hospital Stay: Payer: BC Managed Care – PPO

## 2021-06-01 ENCOUNTER — Other Ambulatory Visit: Payer: Self-pay

## 2021-06-01 VITALS — BP 115/74 | HR 76 | Temp 96.0°F | Resp 17

## 2021-06-01 DIAGNOSIS — D473 Essential (hemorrhagic) thrombocythemia: Secondary | ICD-10-CM | POA: Diagnosis not present

## 2021-06-01 DIAGNOSIS — D649 Anemia, unspecified: Secondary | ICD-10-CM

## 2021-06-01 LAB — CBC WITH DIFFERENTIAL/PLATELET
Abs Immature Granulocytes: 0.04 10*3/uL (ref 0.00–0.07)
Basophils Absolute: 0.1 10*3/uL (ref 0.0–0.1)
Basophils Relative: 2 %
Eosinophils Absolute: 0.3 10*3/uL (ref 0.0–0.5)
Eosinophils Relative: 4 %
HCT: 28.4 % — ABNORMAL LOW (ref 39.0–52.0)
Hemoglobin: 8.4 g/dL — ABNORMAL LOW (ref 13.0–17.0)
Immature Granulocytes: 1 %
Lymphocytes Relative: 21 %
Lymphs Abs: 1.6 10*3/uL (ref 0.7–4.0)
MCH: 25.2 pg — ABNORMAL LOW (ref 26.0–34.0)
MCHC: 29.6 g/dL — ABNORMAL LOW (ref 30.0–36.0)
MCV: 85.3 fL (ref 80.0–100.0)
Monocytes Absolute: 1.4 10*3/uL — ABNORMAL HIGH (ref 0.1–1.0)
Monocytes Relative: 19 %
Neutro Abs: 4.2 10*3/uL (ref 1.7–7.7)
Neutrophils Relative %: 53 %
Platelets: 923 10*3/uL (ref 150–400)
RBC: 3.33 MIL/uL — ABNORMAL LOW (ref 4.22–5.81)
RDW: 16.4 % — ABNORMAL HIGH (ref 11.5–15.5)
Smear Review: INCREASED
WBC: 7.7 10*3/uL (ref 4.0–10.5)
nRBC: 0 % (ref 0.0–0.2)

## 2021-06-01 MED ORDER — SODIUM CHLORIDE 0.9 % IV SOLN: 200.0000 mg | Freq: Once | INTRAVENOUS | Status: DC

## 2021-06-01 MED ORDER — SODIUM CHLORIDE 0.9 % IV SOLN
Freq: Once | INTRAVENOUS | Status: AC
  Filled 2021-06-01: qty 250

## 2021-06-01 MED ORDER — IRON SUCROSE 20 MG/ML IV SOLN
200.0000 mg | Freq: Once | INTRAVENOUS | Status: AC
  Administered 2021-06-01: 200 mg via INTRAVENOUS
  Filled 2021-06-01: qty 10

## 2021-06-01 NOTE — Patient Instructions (Signed)

## 2021-06-03 ENCOUNTER — Other Ambulatory Visit: Payer: Self-pay | Admitting: *Deleted

## 2021-06-03 DIAGNOSIS — D649 Anemia, unspecified: Secondary | ICD-10-CM

## 2021-06-04 ENCOUNTER — Inpatient Hospital Stay: Payer: BC Managed Care – PPO

## 2021-06-04 ENCOUNTER — Other Ambulatory Visit: Payer: Self-pay

## 2021-06-04 VITALS — BP 121/73 | HR 87 | Temp 97.9°F | Resp 16

## 2021-06-04 DIAGNOSIS — D649 Anemia, unspecified: Secondary | ICD-10-CM

## 2021-06-04 DIAGNOSIS — D473 Essential (hemorrhagic) thrombocythemia: Secondary | ICD-10-CM | POA: Diagnosis not present

## 2021-06-04 MED ORDER — ACETAMINOPHEN 325 MG PO TABS
650.0000 mg | ORAL_TABLET | Freq: Once | ORAL | Status: AC
Start: 2021-06-04 — End: 2021-06-04
  Administered 2021-06-04: 650 mg via ORAL
  Filled 2021-06-04: qty 2

## 2021-06-04 MED ORDER — SODIUM CHLORIDE 0.9 % IV SOLN
Freq: Once | INTRAVENOUS | Status: AC
  Filled 2021-06-04: qty 250

## 2021-06-04 MED ORDER — SODIUM CHLORIDE 0.9 % IV SOLN: 200.0000 mg | Freq: Once | INTRAVENOUS | Status: DC

## 2021-06-04 MED ORDER — DIPHENHYDRAMINE HCL 25 MG PO CAPS
50.0000 mg | ORAL_CAPSULE | Freq: Once | ORAL | Status: AC
  Administered 2021-06-04: 50 mg via ORAL
  Filled 2021-06-04: qty 2

## 2021-06-04 MED ORDER — IRON SUCROSE 20 MG/ML IV SOLN
200.0000 mg | Freq: Once | INTRAVENOUS | Status: AC
  Administered 2021-06-04: 200 mg via INTRAVENOUS
  Filled 2021-06-04: qty 10

## 2021-06-04 NOTE — Patient Instructions (Signed)

## 2021-06-04 NOTE — Progress Notes (Signed)
Following his last infusion on 10/17, he reports he experienced a 100.4 fever and nausea about 2 hours after infusion. This lasted about 3 hours. Took Tylenol and symptoms resolved that evening. Notified Dr. Rogue Bussing. New orders received for Tylenol 650mg  PO and Benadryl 50mg  PO prior to infusion today. Infusion well tolerated during and immediately after treatment. Discharged in stable condition.

## 2021-06-08 ENCOUNTER — Encounter: Payer: Self-pay | Admitting: Internal Medicine

## 2021-06-08 ENCOUNTER — Inpatient Hospital Stay: Payer: BC Managed Care – PPO

## 2021-06-08 ENCOUNTER — Other Ambulatory Visit: Payer: Self-pay

## 2021-06-08 ENCOUNTER — Inpatient Hospital Stay (HOSPITAL_BASED_OUTPATIENT_CLINIC_OR_DEPARTMENT_OTHER): Payer: BC Managed Care – PPO | Admitting: Internal Medicine

## 2021-06-08 DIAGNOSIS — D473 Essential (hemorrhagic) thrombocythemia: Secondary | ICD-10-CM | POA: Diagnosis not present

## 2021-06-08 DIAGNOSIS — E611 Iron deficiency: Secondary | ICD-10-CM | POA: Diagnosis not present

## 2021-06-08 DIAGNOSIS — D649 Anemia, unspecified: Secondary | ICD-10-CM

## 2021-06-08 LAB — IRON AND TIBC
Iron: 36 ug/dL — ABNORMAL LOW (ref 45–182)
Saturation Ratios: 8 % — ABNORMAL LOW (ref 17.9–39.5)
TIBC: 475 ug/dL — ABNORMAL HIGH (ref 250–450)
UIBC: 439 ug/dL

## 2021-06-08 LAB — CBC WITH DIFFERENTIAL/PLATELET
Abs Immature Granulocytes: 0.03 10*3/uL (ref 0.00–0.07)
Basophils Absolute: 0.1 10*3/uL (ref 0.0–0.1)
Basophils Relative: 1 %
Eosinophils Absolute: 0.2 10*3/uL (ref 0.0–0.5)
Eosinophils Relative: 2 %
HCT: 31.1 % — ABNORMAL LOW (ref 39.0–52.0)
Hemoglobin: 9.2 g/dL — ABNORMAL LOW (ref 13.0–17.0)
Immature Granulocytes: 0 %
Lymphocytes Relative: 23 %
Lymphs Abs: 2.2 10*3/uL (ref 0.7–4.0)
MCH: 25.7 pg — ABNORMAL LOW (ref 26.0–34.0)
MCHC: 29.6 g/dL — ABNORMAL LOW (ref 30.0–36.0)
MCV: 86.9 fL (ref 80.0–100.0)
Monocytes Absolute: 0.9 10*3/uL (ref 0.1–1.0)
Monocytes Relative: 9 %
Neutro Abs: 6.3 10*3/uL (ref 1.7–7.7)
Neutrophils Relative %: 65 %
Platelets: 449 10*3/uL — ABNORMAL HIGH (ref 150–400)
RBC: 3.58 MIL/uL — ABNORMAL LOW (ref 4.22–5.81)
RDW: 19.5 % — ABNORMAL HIGH (ref 11.5–15.5)
WBC: 9.7 10*3/uL (ref 4.0–10.5)
nRBC: 0 % (ref 0.0–0.2)

## 2021-06-08 LAB — BASIC METABOLIC PANEL
Anion gap: 7 (ref 5–15)
BUN: 22 mg/dL (ref 8–23)
CO2: 26 mmol/L (ref 22–32)
Calcium: 9 mg/dL (ref 8.9–10.3)
Chloride: 100 mmol/L (ref 98–111)
Creatinine, Ser: 0.78 mg/dL (ref 0.61–1.24)
GFR, Estimated: >60 mL/min (ref >60)
Glucose, Bld: 100 mg/dL — ABNORMAL HIGH (ref 70–99)
Potassium: 3.9 mmol/L (ref 3.5–5.1)
Sodium: 133 mmol/L — ABNORMAL LOW (ref 135–145)

## 2021-06-08 LAB — FERRITIN: Ferritin: 59 ng/mL (ref 24–336)

## 2021-06-08 MED ORDER — DIPHENHYDRAMINE HCL 25 MG PO CAPS
50.0000 mg | ORAL_CAPSULE | Freq: Once | ORAL | Status: AC
  Administered 2021-06-08: 50 mg via ORAL
  Filled 2021-06-08: qty 2

## 2021-06-08 MED ORDER — IRON SUCROSE 20 MG/ML IV SOLN
200.0000 mg | Freq: Once | INTRAVENOUS | Status: AC
  Administered 2021-06-08: 200 mg via INTRAVENOUS
  Filled 2021-06-08: qty 10

## 2021-06-08 MED ORDER — SODIUM CHLORIDE 0.9 % IV SOLN
Freq: Once | INTRAVENOUS | Status: AC
  Filled 2021-06-08: qty 250

## 2021-06-08 MED ORDER — ACETAMINOPHEN 325 MG PO TABS
650.0000 mg | ORAL_TABLET | Freq: Once | ORAL | Status: AC
  Administered 2021-06-08: 650 mg via ORAL
  Filled 2021-06-08: qty 2

## 2021-06-08 MED ORDER — SODIUM CHLORIDE 0.9 % IV SOLN: 200.0000 mg | Freq: Once | INTRAVENOUS | Status: DC

## 2021-06-08 NOTE — Patient Instructions (Signed)
[facility]  Discharge Instructions: Thank you for choosing [facility] to provide your oncology and hematology care.  If you have a lab appointment with the Cancer Center, please go directly to the Cancer Center and check in at the registration area.  Wear comfortable clothing and clothing appropriate for easy access to any Portacath or PICC line.   We strive to give you quality time with your provider. You may need to reschedule your appointment if you arrive late (15 or more minutes).  Arriving late affects you and other patients whose appointments are after yours.  Also, if you miss three or more appointments without notifying the office, you may be dismissed from the clinic at the provider's discretion.      For prescription refill requests, have your pharmacy contact our office and allow 72 hours for refills to be completed.    Today you received the following : Venofer   To help prevent nausea and vomiting after your treatment, we encourage you to take your nausea medication as directed.  BELOW ARE SYMPTOMS THAT SHOULD BE REPORTED IMMEDIATELY: . *FEVER GREATER THAN 100.4 F (38 C) OR HIGHER . *CHILLS OR SWEATING . *NAUSEA AND VOMITING THAT IS NOT CONTROLLED WITH YOUR NAUSEA MEDICATION . *UNUSUAL SHORTNESS OF BREATH . *UNUSUAL BRUISING OR BLEEDING . *URINARY PROBLEMS (pain or burning when urinating, or frequent urination) . *BOWEL PROBLEMS (unusual diarrhea, constipation, pain near the anus) . TENDERNESS IN MOUTH AND THROAT WITH OR WITHOUT PRESENCE OF ULCERS (sore throat, sores in mouth, or a toothache) . UNUSUAL RASH, SWELLING OR PAIN  . UNUSUAL VAGINAL DISCHARGE OR ITCHING   Items with * indicate a potential emergency and should be followed up as soon as possible or go to the Emergency Department if any problems should occur.  Please show the CHEMOTHERAPY ALERT CARD or IMMUNOTHERAPY ALERT CARD at check-in to the Emergency  Department and triage nurse.  Should you have questions after your visit or need to cancel or reschedule your appointment, please contact [facility]  [phone] and follow the prompts.  Office hours are 8:00 a.m. to 4:30 p.m. Monday - Friday. Please note that voicemails left after 4:00 p.m. may not be returned until the following business day.  We are closed weekends and major holidays. You have access to a nurse at all times for urgent questions. Please call the main number to the clinic [phone] and follow the prompts.  For any non-urgent questions, you may also contact your provider using MyChart. We now offer e-Visits for anyone 18 and older to request care online for non-urgent symptoms. For details visit [email or web address].   Also download the MyChart app! Go to the app store, search "MyChart", open the app, select [facility], and log in with your MyChart username and password.  Due to Covid, a mask is required upon entering the hospital/clinic. If you do not have a mask, one will be given to you upon arrival. For doctor visits, patients may have 1 support person aged 18 or older with them. For treatment visits, patients cannot have anyone with them due to current Covid guidelines and our immunocompromised population.  

## 2021-06-08 NOTE — Assessment & Plan Note (Addendum)
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 Hydrea 500 mg one a day-platelets improved at 449.  Continue Hydrea 500 mg a day.  #Iron deficient anemia [OCT 2022- ferritin-6; iron sat 3%]- Hb 8.2 s/p hip surgery [3 months: 90]-s/p IV iron infusion-hemoglobin improved at 9.3.  Continue IV iron infusions; no improvement with prior p.o. iron.  Add Benadryl Tylenol premedication  # ETIOLOGY: ? EGD [2019- [facility]]; Colonoscopy [2021]/ sigmoidoscopy [hemorrhoidal bleeding but mild hemorroids; [place]- GI-no banding needed]-question blood loss from surgery.  # DISPOSITION: # venofer today; and this week # again venofer -twice next week.  # follow up in 1st week of dec 2022- MD; labs- cbc/iron studies/ferritin; possible venofer-Dr.B

## 2021-06-08 NOTE — Progress Notes (Signed)
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 On zantac.    # Left hip replacement ------------------------------------------------   DIAGNOSIS: [ ]   STAGE:         ;GOALS:  CURRENT/MOST RECENT THERAPY [ ]       Essential thrombocythemia (HCC)     INTERVAL HISTORY:  A very pleasant 65 year-old male patient with above history of essential thrombocytosis currently on low-dose Hydrea; and also iron deficiency anemia is here for follow-up.  The interim patient underwent MRI of his hip with orthopedics/[facility].  Patient had episode of nausea fatigue post iron infusion.  Improved after taking Tylenol/Benadryl prior to subsequent infusions.  Notes to have improvement of his fatigue.  Intermittent blood in stool/attributable to hemorrhoidal bleeding.  Review of Systems  Constitutional:  Negative for chills, diaphoresis, fever, malaise/fatigue and weight loss.  HENT:  Negative for nosebleeds and sore throat.   Eyes:  Negative for double vision.  Respiratory:  Negative for cough, hemoptysis, sputum production, shortness of breath and wheezing.   Cardiovascular:  Negative for chest pain, palpitations, orthopnea and leg swelling.  Gastrointestinal:  Negative for abdominal pain, blood in stool, constipation, diarrhea, heartburn, melena, nausea and vomiting.  Genitourinary:  Negative for dysuria, frequency and urgency.  Musculoskeletal:  Negative for back pain and joint pain.  Skin: Negative.  Negative for itching and rash.  Neurological:  Negative for dizziness,  tingling, focal weakness, weakness and headaches.  Endo/Heme/Allergies:  Does not bruise/bleed easily.  Psychiatric/Behavioral:  Negative for depression. The patient is not nervous/anxious and does not have insomnia.      PAST MEDICAL HISTORY :  Past Medical History:  Diagnosis Date   Arthritis    osteoarthritis -hips,spine,hands and left wrist.   Arthritis    Complication of anesthesia    GERD (gastroesophageal reflux disease)    Pneumonia    PONV (postoperative nausea and vomiting)    Sleep apnea    Thrombocytosis    Unspecified diseases of blood and blood-forming organs    "essential hyperthrombocytosis"    PAST SURGICAL HISTORY :   Past Surgical History:  Procedure Laterality Date   BACK SURGERY  June 2015   ROTATOR CUFF REPAIR     x3(lt x2), rt. x1   TOTAL HIP ARTHROPLASTY Left 02/11/2014   Procedure: LEFT TOTAL HIP ARTHROPLASTY ANTERIOR APPROACH;  Surgeon: Gearlean Alf, MD;  Location: [facility];  Service: Orthopedics;  Laterality: Left;   TOTAL HIP ARTHROPLASTY Right 03/11/2021   Procedure: TOTAL HIP ARTHROPLASTY ANTERIOR APPROACH;  Surgeon: Gaynelle Arabian, MD;  Location: [facility];  Service: Orthopedics;  Laterality: Right;    FAMILY HISTORY :   Family History  Problem Relation Age of Onset   Cancer Maternal Aunt    Cancer Paternal Aunt    Stroke Maternal Grandfather     SOCIAL HISTORY:   Social History   Tobacco Use   Smoking status: Never   Smokeless tobacco: Never  Vaping Use   Vaping Use: Never used  Substance Use Topics   Alcohol use: Yes    Alcohol/week: 2.0 standard drinks    Types: 2 Cans of beer  per week    Comment: beer/ wine 2glasses  per night   Drug use: No    ALLERGIES:  is allergic to hornet venom, wasp venom protein, vicodin [hydrocodone-acetaminophen], other, and penicillins.  MEDICATIONS:  Current Outpatient Medications  Medication Sig Dispense Refill   hydroxyurea (HYDREA) 500 MG capsule TAKE TWO CAPSULES DAILY ON MONDAYS, WEDNESDAYS,  AND FRIDAYS. TAKE ONLY ONE CAPSULE DAILY ON OTHER DAYS OF THE WEEK 45 capsule 12   ibuprofen (ADVIL) 200 MG tablet Take 200 mg by mouth every 6 (six) hours as needed.     loratadine (CLARITIN) 10 MG tablet Take 10 mg by mouth daily.     sildenafil (REVATIO) 20 MG tablet Take 60 mg by mouth daily as needed (ED).     traMADol (ULTRAM) 50 MG tablet Take 1-2 tablets (50-100 mg total) by mouth every 6 (six) hours as needed for moderate pain. 40 tablet 0   albuterol (VENTOLIN HFA) 108 (90 Base) MCG/ACT inhaler Inhale 2 puffs into the lungs every 6 (six) hours as needed for wheezing or shortness of breath. 8 g 2   aspirin EC 81 MG tablet Take 81 mg by mouth daily. Swallow whole.     benzonatate (TESSALON PERLES) 100 MG capsule Take 1 capsule (100 mg total) by mouth 3 (three) times daily as needed for cough. (Patient not taking: No sig reported) 20 capsule 0   chlorproMAZINE (THORAZINE) 25 MG tablet      cyclobenzaprine (FLEXERIL) 5 MG tablet Take 2 tablets (10 mg total) by mouth 3 (three) times daily as needed for muscle spasms. 40 tablet 0   EPINEPHrine 0.3 mg/0.3 mL IJ SOAJ injection Inject 0.3 mg into the muscle as needed for anaphylaxis.  0   fluticasone (FLONASE) 50 MCG/ACT nasal spray Place 1 spray into both nostrils daily. (Patient not taking: Reported on 06/08/2021)     ondansetron (ZOFRAN) 4 MG tablet Take 1 tablet (4 mg total) by mouth every 6 (six) hours as needed for nausea. (Patient not taking: No sig reported) 20 tablet 0   oxyCODONE (OXY IR/ROXICODONE) 5 MG immediate release tablet Take 1-2 tablets (5-10 mg total) by mouth every 6 (six) hours as needed for severe pain. (Patient not taking: No sig reported) 42 tablet 0   No current facility-administered medications for this visit.   Facility-Administered Medications Ordered in Other Visits  Medication Dose Route Frequency Provider Last Rate Last Admin   0.9 %  sodium chloride infusion   Intravenous Once Cammie Sickle, MD       iron  sucrose (VENOFER) injection 200 mg  200 mg Intravenous Once Cammie Sickle, MD        PHYSICAL EXAMINATION: ECOG PERFORMANCE STATUS: 0 - Asymptomatic  BP 110/72 (BP Location: Left Arm, Patient Position: Sitting)   Pulse 79   Temp (!) 97.1 F (36.2 C) (Tympanic)   Resp 18   Wt 183 lb 8 oz (83.2 kg)   SpO2 100%   BMI 26.33 kg/m   Filed Weights   06/08/21 1403  Weight: 183 lb 8 oz (83.2 kg)    Physical Exam HENT:     Head: Normocephalic and atraumatic.     Mouth/Throat:     Pharynx: No oropharyngeal exudate.  Eyes:     Pupils: Pupils are equal, round, and reactive to light.  Cardiovascular:     Rate and Rhythm: Normal rate and regular rhythm.  Pulmonary:     Effort: No respiratory distress.     Breath sounds:  No wheezing.  Abdominal:     General: Bowel sounds are normal. There is no distension.     Palpations: Abdomen is soft. There is no mass.     Tenderness: There is no abdominal tenderness. There is no guarding or rebound.  Musculoskeletal:        General: No tenderness. Normal range of motion.     Cervical back: Normal range of motion and neck supple.  Skin:    General: Skin is warm.  Neurological:     Mental Status: He is alert and oriented to person, place, and time.  Psychiatric:        Mood and Affect: Affect normal.   LABORATORY DATA:  I have reviewed the data as listed    Component Value Date/Time   NA 133 (L) 06/08/2021 1350   K 3.9 06/08/2021 1350   CL 100 06/08/2021 1350   CO2 26 06/08/2021 1350   GLUCOSE 100 (H) 06/08/2021 1350   BUN 22 06/08/2021 1350   CREATININE 0.78 06/08/2021 1350   CREATININE 0.86 02/28/2014 1450   CALCIUM 9.0 06/08/2021 1350   PROT 7.2 05/12/2021 1434   PROT 7.4 02/28/2014 1450   ALBUMIN 3.9 05/12/2021 1434   ALBUMIN 3.1 (L) 02/28/2014 1450   AST 19 05/12/2021 1434   AST 16 02/28/2014 1450   ALT 14 05/12/2021 1434   ALT 40 02/28/2014 1450   ALKPHOS 67 05/12/2021 1434   ALKPHOS 109 02/28/2014 1450   BILITOT  0.7 05/12/2021 1434   BILITOT 0.5 02/28/2014 1450   GFRNONAA >60 06/08/2021 1350   GFRNONAA >60 02/28/2014 1450   GFRAA >60 05/12/2020 1423   GFRAA >60 02/28/2014 1450    No results found for: SPEP, UPEP  Lab Results  Component Value Date   WBC 9.7 06/08/2021   NEUTROABS 6.3 06/08/2021   HGB 9.2 (L) 06/08/2021   HCT 31.1 (L) 06/08/2021   MCV 86.9 06/08/2021   PLT 449 (H) 06/08/2021      Chemistry      Component Value Date/Time   NA 133 (L) 06/08/2021 1350   K 3.9 06/08/2021 1350   CL 100 06/08/2021 1350   CO2 26 06/08/2021 1350   BUN 22 06/08/2021 1350   CREATININE 0.78 06/08/2021 1350   CREATININE 0.86 02/28/2014 1450      Component Value Date/Time   CALCIUM 9.0 06/08/2021 1350   ALKPHOS 67 05/12/2021 1434   ALKPHOS 109 02/28/2014 1450   AST 19 05/12/2021 1434   AST 16 02/28/2014 1450   ALT 14 05/12/2021 1434   ALT 40 02/28/2014 1450   BILITOT 0.7 05/12/2021 1434   BILITOT 0.5 02/28/2014 1450      ASSESSMENT & PLAN:   Iron deficiency # 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 Hydrea 500 mg one a day-platelets improved at 449.  Continue Hydrea 500 mg a day.  #Iron deficient anemia [OCT 2022- ferritin-6; iron sat 3%]- Hb 8.2 s/p hip surgery [3 months: 90]-s/p IV iron infusion-hemoglobin improved at 9.3.  Continue IV iron infusions; no improvement with prior p.o. iron.  Add Benadryl Tylenol premedication  # ETIOLOGY: ? EGD [2019- refulx]; Colonoscopy [2021]/ sigmoidoscopy [hemorrhoidal bleeding but mild hemorroids; [place]- GI-no banding needed]-question blood loss from surgery.  # DISPOSITION: # venofer today; and this week # again venofer -twice next week.  # follow up in 1st week of dec 2022- MD; labs- cbc/iron studies/ferritin; possible venofer-Dr.B     Cammie Sickle, MD 06/08/2021 3:27 PM

## 2021-06-11 ENCOUNTER — Encounter: Payer: Self-pay | Admitting: Internal Medicine

## 2021-06-11 ENCOUNTER — Other Ambulatory Visit: Payer: Self-pay

## 2021-06-11 ENCOUNTER — Inpatient Hospital Stay: Payer: BC Managed Care – PPO

## 2021-06-11 VITALS — BP 112/86 | Resp 18

## 2021-06-11 DIAGNOSIS — D473 Essential (hemorrhagic) thrombocythemia: Secondary | ICD-10-CM | POA: Diagnosis not present

## 2021-06-11 DIAGNOSIS — D649 Anemia, unspecified: Secondary | ICD-10-CM

## 2021-06-11 MED ORDER — SODIUM CHLORIDE 0.9 % IV SOLN
Freq: Once | INTRAVENOUS | Status: AC
  Filled 2021-06-11: qty 250

## 2021-06-11 MED ORDER — SODIUM CHLORIDE 0.9 % IV SOLN: 200.0000 mg | Freq: Once | INTRAVENOUS | Status: DC

## 2021-06-11 MED ORDER — IRON SUCROSE 20 MG/ML IV SOLN
200.0000 mg | Freq: Once | INTRAVENOUS | Status: AC
  Administered 2021-06-11: 200 mg via INTRAVENOUS
  Filled 2021-06-11: qty 10

## 2021-06-11 NOTE — Progress Notes (Signed)
Pt took tylenol 650 mg & benadryl 25 mg po prior to arrival

## 2021-06-12 ENCOUNTER — Encounter: Payer: Self-pay | Admitting: Internal Medicine

## 2021-06-15 ENCOUNTER — Inpatient Hospital Stay: Payer: BC Managed Care – PPO

## 2021-06-15 ENCOUNTER — Other Ambulatory Visit: Payer: Self-pay

## 2021-06-15 VITALS — BP 113/62 | HR 74 | Resp 18

## 2021-06-15 DIAGNOSIS — D649 Anemia, unspecified: Secondary | ICD-10-CM

## 2021-06-15 DIAGNOSIS — D473 Essential (hemorrhagic) thrombocythemia: Secondary | ICD-10-CM | POA: Diagnosis not present

## 2021-06-15 MED ORDER — ACETAMINOPHEN 325 MG PO TABS: 650.0000 mg | ORAL_TABLET | Freq: Once | ORAL | Status: DC

## 2021-06-15 MED ORDER — SODIUM CHLORIDE 0.9 % IV SOLN: 200.0000 mg | Freq: Once | INTRAVENOUS | Status: DC

## 2021-06-15 MED ORDER — SODIUM CHLORIDE 0.9 % IV SOLN
Freq: Once | INTRAVENOUS | Status: AC
  Filled 2021-06-15: qty 250

## 2021-06-15 MED ORDER — IRON SUCROSE 20 MG/ML IV SOLN
200.0000 mg | Freq: Once | INTRAVENOUS | Status: AC
  Administered 2021-06-15: 200 mg via INTRAVENOUS
  Filled 2021-06-15: qty 10

## 2021-06-15 MED ORDER — DIPHENHYDRAMINE HCL 25 MG PO CAPS: 50.0000 mg | ORAL_CAPSULE | Freq: Once | ORAL | Status: DC

## 2021-06-17 ENCOUNTER — Encounter: Payer: Self-pay | Admitting: Internal Medicine

## 2021-06-18 ENCOUNTER — Other Ambulatory Visit: Payer: Self-pay

## 2021-06-18 ENCOUNTER — Inpatient Hospital Stay: Payer: BC Managed Care – PPO | Attending: Internal Medicine

## 2021-06-18 VITALS — BP 115/75 | HR 72 | Temp 96.2°F | Resp 18

## 2021-06-18 DIAGNOSIS — D649 Anemia, unspecified: Secondary | ICD-10-CM | POA: Insufficient documentation

## 2021-06-18 DIAGNOSIS — D473 Essential (hemorrhagic) thrombocythemia: Secondary | ICD-10-CM | POA: Diagnosis present

## 2021-06-18 MED ORDER — ACETAMINOPHEN 325 MG PO TABS: 650.0000 mg | ORAL_TABLET | Freq: Once | ORAL | Status: DC

## 2021-06-18 MED ORDER — DIPHENHYDRAMINE HCL 25 MG PO CAPS: 50.0000 mg | ORAL_CAPSULE | Freq: Once | ORAL | Status: DC

## 2021-06-18 MED ORDER — SODIUM CHLORIDE 0.9 % IV SOLN: 200.0000 mg | Freq: Once | INTRAVENOUS | Status: DC

## 2021-06-18 MED ORDER — IRON SUCROSE 20 MG/ML IV SOLN
200.0000 mg | Freq: Once | INTRAVENOUS | Status: AC
  Administered 2021-06-18: 200 mg via INTRAVENOUS
  Filled 2021-06-18: qty 10

## 2021-06-18 MED ORDER — SODIUM CHLORIDE 0.9 % IV SOLN
Freq: Once | INTRAVENOUS | Status: AC
  Filled 2021-06-18: qty 250

## 2021-06-18 NOTE — Patient Instructions (Signed)

## 2021-06-25 ENCOUNTER — Encounter: Payer: Self-pay | Admitting: Internal Medicine

## 2021-07-03 ENCOUNTER — Encounter: Payer: Self-pay | Admitting: Internal Medicine

## 2021-07-06 ENCOUNTER — Encounter: Payer: Self-pay | Admitting: Internal Medicine

## 2021-07-06 ENCOUNTER — Other Ambulatory Visit: Payer: Self-pay

## 2021-07-06 DIAGNOSIS — D473 Essential (hemorrhagic) thrombocythemia: Secondary | ICD-10-CM

## 2021-07-07 ENCOUNTER — Encounter: Payer: Self-pay | Admitting: Internal Medicine

## 2021-07-07 MED ORDER — HYDROXYUREA 500 MG PO CAPS: 500.0000 mg | ORAL_CAPSULE | Freq: Every day | ORAL | 3 refills | Status: DC

## 2021-07-07 NOTE — Telephone Encounter (Signed)
JAK-2 positive. Hydrea 500 mg one a day-platelets improved at 449.  Continue Hydrea 500 mg a day.

## 2021-07-08 ENCOUNTER — Other Ambulatory Visit: Payer: Self-pay | Admitting: Internal Medicine

## 2021-07-15 ENCOUNTER — Other Ambulatory Visit: Payer: Self-pay | Admitting: *Deleted

## 2021-07-15 DIAGNOSIS — E611 Iron deficiency: Secondary | ICD-10-CM

## 2021-07-15 DIAGNOSIS — D649 Anemia, unspecified: Secondary | ICD-10-CM

## 2021-07-20 ENCOUNTER — Other Ambulatory Visit: Payer: Self-pay

## 2021-07-20 ENCOUNTER — Inpatient Hospital Stay: Payer: BC Managed Care – PPO | Attending: Internal Medicine

## 2021-07-20 ENCOUNTER — Inpatient Hospital Stay (HOSPITAL_BASED_OUTPATIENT_CLINIC_OR_DEPARTMENT_OTHER): Payer: BC Managed Care – PPO | Admitting: Internal Medicine

## 2021-07-20 ENCOUNTER — Inpatient Hospital Stay: Payer: BC Managed Care – PPO

## 2021-07-20 VITALS — BP 110/78 | HR 72 | Resp 16

## 2021-07-20 DIAGNOSIS — D473 Essential (hemorrhagic) thrombocythemia: Secondary | ICD-10-CM

## 2021-07-20 DIAGNOSIS — D75839 Thrombocytosis, unspecified: Secondary | ICD-10-CM | POA: Insufficient documentation

## 2021-07-20 DIAGNOSIS — D509 Iron deficiency anemia, unspecified: Secondary | ICD-10-CM | POA: Insufficient documentation

## 2021-07-20 DIAGNOSIS — D649 Anemia, unspecified: Secondary | ICD-10-CM

## 2021-07-20 DIAGNOSIS — Z79899 Other long term (current) drug therapy: Secondary | ICD-10-CM | POA: Diagnosis not present

## 2021-07-20 DIAGNOSIS — Z880 Allergy status to penicillin: Secondary | ICD-10-CM | POA: Diagnosis not present

## 2021-07-20 DIAGNOSIS — M25559 Pain in unspecified hip: Secondary | ICD-10-CM | POA: Insufficient documentation

## 2021-07-20 DIAGNOSIS — Z823 Family history of stroke: Secondary | ICD-10-CM | POA: Insufficient documentation

## 2021-07-20 DIAGNOSIS — Z809 Family history of malignant neoplasm, unspecified: Secondary | ICD-10-CM | POA: Diagnosis not present

## 2021-07-20 DIAGNOSIS — E611 Iron deficiency: Secondary | ICD-10-CM

## 2021-07-20 DIAGNOSIS — Z885 Allergy status to narcotic agent status: Secondary | ICD-10-CM | POA: Diagnosis not present

## 2021-07-20 LAB — CBC WITH DIFFERENTIAL/PLATELET
Abs Immature Granulocytes: 0.02 10*3/uL (ref 0.00–0.07)
Basophils Absolute: 0.1 10*3/uL (ref 0.0–0.1)
Basophils Relative: 2 %
Eosinophils Absolute: 0.1 10*3/uL (ref 0.0–0.5)
Eosinophils Relative: 2 %
HCT: 36.5 % — ABNORMAL LOW (ref 39.0–52.0)
Hemoglobin: 11.2 g/dL — ABNORMAL LOW (ref 13.0–17.0)
Immature Granulocytes: 0 %
Lymphocytes Relative: 21 %
Lymphs Abs: 1.6 10*3/uL (ref 0.7–4.0)
MCH: 27.8 pg (ref 26.0–34.0)
MCHC: 30.7 g/dL (ref 30.0–36.0)
MCV: 90.6 fL (ref 80.0–100.0)
Monocytes Absolute: 0.9 10*3/uL (ref 0.1–1.0)
Monocytes Relative: 11 %
Neutro Abs: 4.9 10*3/uL (ref 1.7–7.7)
Neutrophils Relative %: 64 %
Platelets: 350 10*3/uL (ref 150–400)
RBC: 4.03 MIL/uL — ABNORMAL LOW (ref 4.22–5.81)
RDW: 20.1 % — ABNORMAL HIGH (ref 11.5–15.5)
WBC: 7.7 10*3/uL (ref 4.0–10.5)
nRBC: 0 % (ref 0.0–0.2)

## 2021-07-20 LAB — IRON AND TIBC
Iron: 22 ug/dL — ABNORMAL LOW (ref 45–182)
Saturation Ratios: 5 % — ABNORMAL LOW (ref 17.9–39.5)
TIBC: 420 ug/dL (ref 250–450)
UIBC: 398 ug/dL

## 2021-07-20 LAB — FERRITIN: Ferritin: 16 ng/mL — ABNORMAL LOW (ref 24–336)

## 2021-07-20 MED ORDER — ACETAMINOPHEN 325 MG PO TABS: 650.0000 mg | ORAL_TABLET | Freq: Once | ORAL | Status: DC

## 2021-07-20 MED ORDER — IRON SUCROSE 20 MG/ML IV SOLN
200.0000 mg | Freq: Once | INTRAVENOUS | Status: AC
  Administered 2021-07-20: 200 mg via INTRAVENOUS
  Filled 2021-07-20: qty 10

## 2021-07-20 MED ORDER — DIPHENHYDRAMINE HCL 25 MG PO CAPS
50.0000 mg | ORAL_CAPSULE | Freq: Once | ORAL | Status: AC
  Administered 2021-07-20: 25 mg via ORAL
  Filled 2021-07-20: qty 2

## 2021-07-20 MED ORDER — SODIUM CHLORIDE 0.9 % IV SOLN: 200.0000 mg | Freq: Once | INTRAVENOUS | Status: DC

## 2021-07-20 MED ORDER — SODIUM CHLORIDE 0.9 % IV SOLN
Freq: Once | INTRAVENOUS | Status: AC
  Filled 2021-07-20: qty 250

## 2021-07-20 NOTE — Patient Instructions (Signed)
[facility]   ?Discharge Instructions: ?Thank you for choosing [facility] to provide your oncology and hematology care.  ?If you have a lab appointment with the Cancer Center, please go directly to the Cancer Center and check in at the registration area. ?  ?We strive to give you quality time with your provider. You may need to reschedule your appointment if you arrive late (15 or more minutes).  Arriving late affects you and other patients whose appointments are after yours.  Also, if you miss three or more appointments without notifying the office, you may be dismissed from the clinic at the provider?s discretion.    ?  ?For prescription refill requests, have your pharmacy contact our office and allow 72 hours for refills to be completed.   ? ?Today you received the following: Venofer.    ?  ?BELOW ARE SYMPTOMS THAT SHOULD BE REPORTED IMMEDIATELY: ?*FEVER GREATER THAN 100.4 F (38 ?C) OR HIGHER ?*CHILLS OR SWEATING ?*NAUSEA AND VOMITING THAT IS NOT CONTROLLED WITH YOUR NAUSEA MEDICATION ?*UNUSUAL SHORTNESS OF BREATH ?*UNUSUAL BRUISING OR BLEEDING ?*URINARY PROBLEMS (pain or burning when urinating, or frequent urination) ?*BOWEL PROBLEMS (unusual diarrhea, constipation, pain near the anus) ?TENDERNESS IN MOUTH AND THROAT WITH OR WITHOUT PRESENCE OF ULCERS (sore throat, sores in mouth, or a toothache) ?UNUSUAL RASH, SWELLING OR PAIN  ?UNUSUAL VAGINAL DISCHARGE OR ITCHING  ? ?Items with * indicate a potential emergency and should be followed up as soon as possible or go to the Emergency Department if any problems should occur. ? ?Should you have questions after your visit or need to cancel or reschedule your appointment, please contact [facility]  Dept: [phone]  and follow the prompts.  Office hours are 8:00 a.m. to 4:30 p.m. Monday - Friday. Please note that voicemails left after 4:00 p.m. may not be returned until the following  business day.  We are closed weekends and major holidays. You have access to a nurse at all times for urgent questions. Please call the main number to the clinic Dept: [phone] and follow the prompts. ? ?For any non-urgent questions, you may also contact your provider using MyChart. We now offer e-Visits for anyone 18 and older to request care online for non-urgent symptoms. For details visit [email or web address]. ?  ?Also download the MyChart app! Go to the app store, search "MyChart", open the app, select [facility], and log in with your MyChart username and password. ? ?Due to Covid, a mask is required upon entering the hospital/clinic. If you do not have a mask, one will be given to you upon arrival. For doctor visits, patients may have 1 support person aged 18 or older with them. For treatment visits, patients cannot have anyone with them due to current Covid guidelines and our immunocompromised population.  ?

## 2021-07-20 NOTE — Progress Notes (Signed)
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 On zantac.    # Left hip replacement ------------------------------------------------   DIAGNOSIS: [ ]   STAGE:         ;GOALS:  CURRENT/MOST RECENT THERAPY [ ]       Essential thrombocythemia ([place])     INTERVAL HISTORY:  A very pleasant 65 year-old male patient with above history of essential thrombocytosis currently on low-dose Hydrea; and also iron deficiency anemia is here for follow-up.  Patient s/p IV iron infusion-noted to have improvement of energy levels.  However he still continues to complain of hip pain/post surgery.  Awaiting evaluation with orthopedics.  Review of Systems  Constitutional:  Negative for chills, diaphoresis, fever, malaise/fatigue and weight loss.  HENT:  Negative for nosebleeds and sore throat.   Eyes:  Negative for double vision.  Respiratory:  Negative for cough, hemoptysis, sputum production, shortness of breath and wheezing.   Cardiovascular:  Negative for chest pain, palpitations, orthopnea and leg swelling.  Gastrointestinal:  Negative for abdominal pain, blood in stool, constipation, diarrhea, heartburn, melena, nausea and vomiting.  Genitourinary:  Negative for dysuria, frequency and urgency.  Musculoskeletal:  Negative for back pain and joint pain.  Skin: Negative.  Negative for itching and rash.  Neurological:  Negative for dizziness, tingling, focal weakness, weakness and headaches.  Endo/Heme/Allergies:  Does not bruise/bleed easily.  Psychiatric/Behavioral:  Negative for  depression. The patient is not nervous/anxious and does not have insomnia.      PAST MEDICAL HISTORY :  Past Medical History:  Diagnosis Date   Arthritis    osteoarthritis -hips,spine,hands and left wrist.   Arthritis    Complication of anesthesia    GERD (gastroesophageal reflux disease)    Pneumonia    PONV (postoperative nausea and vomiting)    Sleep apnea    Thrombocytosis    Unspecified diseases of blood and blood-forming organs    "essential hyperthrombocytosis"    PAST SURGICAL HISTORY :   Past Surgical History:  Procedure Laterality Date   BACK SURGERY  June 2015   ROTATOR CUFF REPAIR     x3(lt x2), rt. x1   TOTAL HIP ARTHROPLASTY Left 02/11/2014   Procedure: LEFT TOTAL HIP ARTHROPLASTY ANTERIOR APPROACH;  Surgeon: Gearlean Alf, MD;  Location: [facility];  Service: Orthopedics;  Laterality: Left;   TOTAL HIP ARTHROPLASTY Right 03/11/2021   Procedure: TOTAL HIP ARTHROPLASTY ANTERIOR APPROACH;  Surgeon: Gaynelle Arabian, MD;  Location: [facility];  Service: Orthopedics;  Laterality: Right;    FAMILY HISTORY :   Family History  Problem Relation Age of Onset   Cancer Maternal Aunt    Cancer Paternal Aunt    Stroke Maternal Grandfather     SOCIAL HISTORY:   Social History   Tobacco Use   Smoking status: Never   Smokeless tobacco: Never  Vaping Use   Vaping Use: Never used  Substance Use Topics   Alcohol use: Yes    Alcohol/week: 2.0 standard drinks    Types: 2 Cans of beer per week    Comment: beer/ wine 2glasses  per night   Drug use: No  ALLERGIES:  is allergic to hornet venom, wasp venom protein, vicodin [hydrocodone-acetaminophen], other, and penicillins.  MEDICATIONS:  Current Outpatient Medications  Medication Sig Dispense Refill   acetaminophen (TYLENOL) 500 MG tablet Take 500 mg by mouth every 6 (six) hours as needed.     albuterol (VENTOLIN HFA) 108 (90 Base) MCG/ACT inhaler Inhale 2 puffs into the lungs every 6 (six) hours as needed for wheezing or  shortness of breath. 8 g 2   aspirin EC 81 MG tablet Take 81 mg by mouth daily. Swallow whole.     cyclobenzaprine (FLEXERIL) 5 MG tablet Take 2 tablets (10 mg total) by mouth 3 (three) times daily as needed for muscle spasms. 40 tablet 0   EPINEPHrine 0.3 mg/0.3 mL IJ SOAJ injection Inject 0.3 mg into the muscle as needed for anaphylaxis.  0   hydroxyurea (HYDREA) 500 MG capsule Take 1 capsule (500 mg total) by mouth daily. TAKE TWO CAPSULES DAILY ON MONDAYS, [place], AND FRIDAYS. TAKE ONLY ONE CAPSULE DAILY ON OTHER DAYS OF THE WEEK (Patient taking differently: Take 500 mg by mouth daily. Patient is taking 1 QD) 90 capsule 3   loratadine (CLARITIN) 10 MG tablet Take 10 mg by mouth daily.     sildenafil (REVATIO) 20 MG tablet Take 60 mg by mouth daily as needed (ED).     traMADol (ULTRAM) 50 MG tablet Take 1-2 tablets (50-100 mg total) by mouth every 6 (six) hours as needed for moderate pain. 40 tablet 0   benzonatate (TESSALON PERLES) 100 MG capsule Take 1 capsule (100 mg total) by mouth 3 (three) times daily as needed for cough. (Patient not taking: Reported on 05/26/2021) 20 capsule 0   chlorproMAZINE (THORAZINE) 25 MG tablet  (Patient not taking: Reported on 07/20/2021)     fluticasone (FLONASE) 50 MCG/ACT nasal spray Place 1 spray into both nostrils daily. (Patient not taking: Reported on 06/08/2021)     ibuprofen (ADVIL) 200 MG tablet Take 200 mg by mouth every 6 (six) hours as needed. (Patient not taking: Reported on 07/20/2021)     metaxalone (SKELAXIN) 800 MG tablet Take 800 mg by mouth 3 (three) times daily.     ondansetron (ZOFRAN) 4 MG tablet Take 1 tablet (4 mg total) by mouth every 6 (six) hours as needed for nausea. (Patient not taking: Reported on 05/12/2021) 20 tablet 0   oxyCODONE (OXY IR/ROXICODONE) 5 MG immediate release tablet Take 1-2 tablets (5-10 mg total) by mouth every 6 (six) hours as needed for severe pain. (Patient not taking: Reported on 05/12/2021) 42 tablet 0   No  current facility-administered medications for this visit.    PHYSICAL EXAMINATION: ECOG PERFORMANCE STATUS: 0 - Asymptomatic  BP 114/69   Pulse 82   Temp (!) 97.4 F (36.3 C)   Resp 18   Wt 186 lb (84.4 kg)   BMI 26.69 kg/m   Filed Weights   07/20/21 1352  Weight: 186 lb (84.4 kg)    Physical Exam HENT:     Head: Normocephalic and atraumatic.     Mouth/Throat:     Pharynx: No oropharyngeal exudate.  Eyes:     Pupils: Pupils are equal, round, and reactive to light.  Cardiovascular:     Rate and Rhythm: Normal rate and regular rhythm.  Pulmonary:     Effort: No respiratory distress.     Breath sounds: No wheezing.  Abdominal:     General: Bowel sounds are normal. There is no distension.     Palpations:  Abdomen is soft. There is no mass.     Tenderness: There is no abdominal tenderness. There is no guarding or rebound.  Musculoskeletal:        General: No tenderness. Normal range of motion.     Cervical back: Normal range of motion and neck supple.  Skin:    General: Skin is warm.  Neurological:     Mental Status: He is alert and oriented to person, place, and time.  Psychiatric:        Mood and Affect: Affect normal.   LABORATORY DATA:  I have reviewed the data as listed    Component Value Date/Time   NA 133 (L) 06/08/2021 1350   K 3.9 06/08/2021 1350   CL 100 06/08/2021 1350   CO2 26 06/08/2021 1350   GLUCOSE 100 (H) 06/08/2021 1350   BUN 22 06/08/2021 1350   CREATININE 0.78 06/08/2021 1350   CREATININE 0.86 02/28/2014 1450   CALCIUM 9.0 06/08/2021 1350   PROT 7.2 05/12/2021 1434   PROT 7.4 02/28/2014 1450   ALBUMIN 3.9 05/12/2021 1434   ALBUMIN 3.1 (L) 02/28/2014 1450   AST 19 05/12/2021 1434   AST 16 02/28/2014 1450   ALT 14 05/12/2021 1434   ALT 40 02/28/2014 1450   ALKPHOS 67 05/12/2021 1434   ALKPHOS 109 02/28/2014 1450   BILITOT 0.7 05/12/2021 1434   BILITOT 0.5 02/28/2014 1450   GFRNONAA >60 06/08/2021 1350   GFRNONAA >60 02/28/2014 1450    GFRAA >60 05/12/2020 1423   GFRAA >60 02/28/2014 1450    No results found for: SPEP, UPEP  Lab Results  Component Value Date   WBC 7.7 07/20/2021   NEUTROABS 4.9 07/20/2021   HGB 11.2 (L) 07/20/2021   HCT 36.5 (L) 07/20/2021   MCV 90.6 07/20/2021   PLT 350 07/20/2021      Chemistry      Component Value Date/Time   NA 133 (L) 06/08/2021 1350   K 3.9 06/08/2021 1350   CL 100 06/08/2021 1350   CO2 26 06/08/2021 1350   BUN 22 06/08/2021 1350   CREATININE 0.78 06/08/2021 1350   CREATININE 0.86 02/28/2014 1450      Component Value Date/Time   CALCIUM 9.0 06/08/2021 1350   ALKPHOS 67 05/12/2021 1434   ALKPHOS 109 02/28/2014 1450   AST 19 05/12/2021 1434   AST 16 02/28/2014 1450   ALT 14 05/12/2021 1434   ALT 40 02/28/2014 1450   BILITOT 0.7 05/12/2021 1434   BILITOT 0.5 02/28/2014 1450      ASSESSMENT & PLAN:   Essential thrombocythemia ([place]) # 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 Hydrea 500 mg one a day-platelets improved at  350.  Continue Hydrea 500 mg a day.  #Iron deficient anemia-suspect secondary to surgery [OCT 2022- ferritin-6; iron sat 3%]-s/p IV iron infusion-hemoglobin today is 11.3.  Proceed with 1 more infusion today.  # ETIOLOGY: ? EGD [2019- refulx]; Colonoscopy [2021]/ sigmoidoscopy [hemorrhoidal bleeding but mild hemorroids; [place]- GI-no banding needed]-question blood loss from surgery.  # DISPOSITION: # venofer today; # follow up in 4 MD; labs- cbc/bmp-venofer-Dr.B     Cammie Sickle, MD 07/20/2021 2:14 PM

## 2021-07-20 NOTE — Progress Notes (Signed)
Patient denies new problems/concerns today.   °

## 2021-07-20 NOTE — Assessment & Plan Note (Signed)
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 Hydrea 500 mg one a day-platelets improved at  350.  Continue Hydrea 500 mg a day.  #Iron deficient anemia-suspect secondary to surgery [OCT 2022- ferritin-6; iron sat 3%]-s/p IV iron infusion-hemoglobin today is 11.3.  Proceed with 1 more infusion today.  # ETIOLOGY: ? EGD [2019- refulx]; Colonoscopy [2021]/ sigmoidoscopy [hemorrhoidal bleeding but mild hemorroids; [place]- GI-no banding needed]-question blood loss from surgery.  # DISPOSITION: # venofer today; # follow up in 4 MD; labs- cbc/bmp-venofer-Dr.B

## 2021-07-24 ENCOUNTER — Other Ambulatory Visit (HOSPITAL_COMMUNITY): Payer: Self-pay | Admitting: Orthopedic Surgery

## 2021-07-24 ENCOUNTER — Other Ambulatory Visit: Payer: Self-pay | Admitting: Orthopedic Surgery

## 2021-07-24 DIAGNOSIS — Z96641 Presence of right artificial hip joint: Secondary | ICD-10-CM

## 2021-08-11 ENCOUNTER — Encounter: Payer: Self-pay | Admitting: Internal Medicine

## 2021-08-14 ENCOUNTER — Encounter (HOSPITAL_COMMUNITY): Payer: Self-pay

## 2021-08-14 ENCOUNTER — Encounter (HOSPITAL_COMMUNITY): Payer: BC Managed Care – PPO

## 2021-08-28 ENCOUNTER — Ambulatory Visit (HOSPITAL_COMMUNITY)
Admission: RE | Admit: 2021-08-28 | Discharge: 2021-08-28 | Disposition: A | Discharge status: Home / Self Care | Payer: BC Managed Care – PPO | Source: Ambulatory Visit | Attending: Orthopedic Surgery | Admitting: Orthopedic Surgery

## 2021-08-28 ENCOUNTER — Other Ambulatory Visit: Payer: Self-pay

## 2021-08-28 DIAGNOSIS — Z96641 Presence of right artificial hip joint: Secondary | ICD-10-CM | POA: Insufficient documentation

## 2021-08-28 MED ORDER — TECHNETIUM TC 99M MEDRONATE IV KIT
20.0000 | PACK | Freq: Once | INTRAVENOUS | Status: AC
  Administered 2021-08-28: 20 via INTRAVENOUS

## 2021-11-10 ENCOUNTER — Encounter: Payer: Self-pay | Admitting: Internal Medicine

## 2021-11-10 ENCOUNTER — Encounter: Payer: Self-pay | Admitting: Nurse Practitioner

## 2021-11-10 ENCOUNTER — Ambulatory Visit: Payer: BC Managed Care – PPO | Admitting: Nurse Practitioner

## 2021-11-10 ENCOUNTER — Other Ambulatory Visit: Payer: Self-pay

## 2021-11-10 VITALS — BP 110/68 | HR 84 | Temp 97.9°F | Resp 18

## 2021-11-10 DIAGNOSIS — J3089 Other allergic rhinitis: Secondary | ICD-10-CM

## 2021-11-10 DIAGNOSIS — R051 Acute cough: Secondary | ICD-10-CM

## 2021-11-10 DIAGNOSIS — Z20822 Contact with and (suspected) exposure to covid-19: Secondary | ICD-10-CM

## 2021-11-10 LAB — POC COVID19 BINAXNOW: SARS Coronavirus 2 Ag: NEGATIVE

## 2021-11-10 MED ORDER — BENZONATATE 100 MG PO CAPS: 100.0000 mg | ORAL_CAPSULE | Freq: Three times a day (TID) | ORAL | 0 refills | Status: AC | PRN

## 2021-11-10 NOTE — Progress Notes (Signed)
? ?  Subjective:  ? ? Patient ID: Joshua Cabrera, male    DOB: 1956/02/19, 66 y.o.   MRN: [id number] ? ?HPI ? ?66 year old male presenting to [facility] with complaints of cough, PND and runny nose on and off for the past week. He does suffer from allergies. He sees an ENT and gets weekly allergy injections. He is in year 3 of allergy shots.  ?Takes Claritin and Flonase daily  ?He has tried some Mucinex OTC  ?Was given an inhaler in the past year for a URI but has not used that  ? ?Denies fever ?Today's Vitals  ? 11/10/21 1515  ?BP: 110/68  ?Pulse: 84  ?Resp: 18  ?Temp: 97.9 ?F (36.6 ?C)  ?TempSrc: Tympanic  ?SpO2: 98%  ? ?There is no height or weight on file to calculate BMI.  ? ?Review of Systems  ?Constitutional: Negative.   ?HENT:  Positive for congestion, postnasal drip and rhinorrhea.   ?Eyes: Negative.   ?Respiratory:  Positive for cough.   ?Cardiovascular: Negative.   ?Genitourinary: Negative.   ?Neurological: Negative.   ?Psychiatric/Behavioral: Negative.    ? ?   ?Objective:  ? Physical Exam ?HENT:  ?   Head: Normocephalic.  ?   Right Ear: Tympanic membrane, ear canal and external ear normal.  ?   Left Ear: Tympanic membrane, ear canal and external ear normal.  ?   Nose: Rhinorrhea present.  ?   Right Turbinates: Pale.  ?   Left Turbinates: Pale.  ?   Mouth/Throat:  ?   Mouth: Mucous membranes are moist.  ?   Comments: PND noted without erythema to pharynx  ?Pulmonary:  ?   Effort: Pulmonary effort is normal.  ?   Breath sounds: Normal breath sounds.  ?Musculoskeletal:  ?   Cervical back: Normal range of motion.  ?Skin: ?   General: Skin is warm.  ?Neurological:  ?   General: No focal deficit present.  ?   Mental Status: He is alert.  ?Psychiatric:     ?   Mood and Affect: Mood normal.  ? ? ? ?1. Encounter for screening laboratory testing for COVID-19 virus ? ?- POC COVID-19 negative  ? ?2. Acute cough ? ?- benzonatate (TESSALON) 100 MG capsule; Take 1 capsule (100 mg total) by mouth 3 (three)  times daily as needed for up to 7 days for cough.  Dispense: 30 capsule; Refill: 0 ? ?3. Allergic rhinitis due to other allergic trigger, unspecified seasonality ?Continue allergy regimen, start inhaler as needed (albuterol)  ? ?RTC if symptoms persist or worsen as discussed  ?   ? ?   ? ?

## 2021-11-16 ENCOUNTER — Other Ambulatory Visit: Payer: BC Managed Care – PPO

## 2021-11-16 ENCOUNTER — Ambulatory Visit: Payer: BC Managed Care – PPO

## 2021-11-16 ENCOUNTER — Ambulatory Visit: Payer: BC Managed Care – PPO | Admitting: Internal Medicine

## 2021-11-23 ENCOUNTER — Ambulatory Visit: Payer: BC Managed Care – PPO

## 2021-11-23 ENCOUNTER — Ambulatory Visit: Payer: BC Managed Care – PPO | Admitting: Internal Medicine

## 2021-11-23 ENCOUNTER — Other Ambulatory Visit: Payer: BC Managed Care – PPO

## 2021-11-30 DIAGNOSIS — K635 Polyp of colon: Secondary | ICD-10-CM | POA: Insufficient documentation

## 2021-11-30 DIAGNOSIS — R12 Heartburn: Secondary | ICD-10-CM | POA: Insufficient documentation

## 2021-11-30 DIAGNOSIS — G8929 Other chronic pain: Secondary | ICD-10-CM | POA: Insufficient documentation

## 2021-11-30 DIAGNOSIS — Z860100 Personal history of colon polyps, unspecified: Secondary | ICD-10-CM | POA: Insufficient documentation

## 2021-11-30 DIAGNOSIS — M419 Scoliosis, unspecified: Secondary | ICD-10-CM | POA: Insufficient documentation

## 2021-12-01 ENCOUNTER — Inpatient Hospital Stay: Payer: BC Managed Care – PPO | Attending: Internal Medicine

## 2021-12-01 ENCOUNTER — Other Ambulatory Visit: Payer: Self-pay

## 2021-12-01 ENCOUNTER — Inpatient Hospital Stay: Payer: BC Managed Care – PPO | Admitting: Internal Medicine

## 2021-12-01 ENCOUNTER — Telehealth: Payer: Self-pay

## 2021-12-01 ENCOUNTER — Inpatient Hospital Stay: Payer: BC Managed Care – PPO

## 2021-12-01 DIAGNOSIS — Z809 Family history of malignant neoplasm, unspecified: Secondary | ICD-10-CM | POA: Diagnosis not present

## 2021-12-01 DIAGNOSIS — D473 Essential (hemorrhagic) thrombocythemia: Secondary | ICD-10-CM | POA: Insufficient documentation

## 2021-12-01 DIAGNOSIS — D509 Iron deficiency anemia, unspecified: Secondary | ICD-10-CM | POA: Diagnosis not present

## 2021-12-01 DIAGNOSIS — K921 Melena: Secondary | ICD-10-CM | POA: Insufficient documentation

## 2021-12-01 DIAGNOSIS — M25559 Pain in unspecified hip: Secondary | ICD-10-CM | POA: Diagnosis not present

## 2021-12-01 DIAGNOSIS — Z79899 Other long term (current) drug therapy: Secondary | ICD-10-CM | POA: Insufficient documentation

## 2021-12-01 DIAGNOSIS — D649 Anemia, unspecified: Secondary | ICD-10-CM

## 2021-12-01 DIAGNOSIS — Z823 Family history of stroke: Secondary | ICD-10-CM | POA: Insufficient documentation

## 2021-12-01 DIAGNOSIS — Z8719 Personal history of other diseases of the digestive system: Secondary | ICD-10-CM | POA: Diagnosis not present

## 2021-12-01 DIAGNOSIS — R5383 Other fatigue: Secondary | ICD-10-CM | POA: Insufficient documentation

## 2021-12-01 DIAGNOSIS — Z880 Allergy status to penicillin: Secondary | ICD-10-CM | POA: Insufficient documentation

## 2021-12-01 DIAGNOSIS — Z885 Allergy status to narcotic agent status: Secondary | ICD-10-CM | POA: Diagnosis not present

## 2021-12-01 DIAGNOSIS — D75839 Thrombocytosis, unspecified: Secondary | ICD-10-CM | POA: Diagnosis not present

## 2021-12-01 LAB — FERRITIN: Ferritin: 4 ng/mL — ABNORMAL LOW (ref 24–336)

## 2021-12-01 LAB — IRON AND TIBC
Iron: 16 ug/dL — ABNORMAL LOW (ref 45–182)
Saturation Ratios: 3 % — ABNORMAL LOW (ref 17.9–39.5)
TIBC: 510 ug/dL — ABNORMAL HIGH (ref 250–450)
UIBC: 494 ug/dL

## 2021-12-01 LAB — CBC WITH DIFFERENTIAL/PLATELET
Abs Immature Granulocytes: 0.02 10*3/uL (ref 0.00–0.07)
Basophils Absolute: 0.1 10*3/uL (ref 0.0–0.1)
Basophils Relative: 2 %
Eosinophils Absolute: 0.2 10*3/uL (ref 0.0–0.5)
Eosinophils Relative: 3 %
HCT: 30.8 % — ABNORMAL LOW (ref 39.0–52.0)
Hemoglobin: 9.3 g/dL — ABNORMAL LOW (ref 13.0–17.0)
Immature Granulocytes: 0 %
Lymphocytes Relative: 25 %
Lymphs Abs: 1.8 10*3/uL (ref 0.7–4.0)
MCH: 24.3 pg — ABNORMAL LOW (ref 26.0–34.0)
MCHC: 30.2 g/dL (ref 30.0–36.0)
MCV: 80.6 fL (ref 80.0–100.0)
Monocytes Absolute: 1 10*3/uL (ref 0.1–1.0)
Monocytes Relative: 14 %
Neutro Abs: 4 10*3/uL (ref 1.7–7.7)
Neutrophils Relative %: 56 %
Platelets: 279 10*3/uL (ref 150–400)
RBC: 3.82 MIL/uL — ABNORMAL LOW (ref 4.22–5.81)
RDW: 16.4 % — ABNORMAL HIGH (ref 11.5–15.5)
WBC: 7.1 10*3/uL (ref 4.0–10.5)
nRBC: 0 % (ref 0.0–0.2)

## 2021-12-01 LAB — BASIC METABOLIC PANEL
Anion gap: 6 (ref 5–15)
BUN: 21 mg/dL (ref 8–23)
CO2: 24 mmol/L (ref 22–32)
Calcium: 8.9 mg/dL (ref 8.9–10.3)
Chloride: 104 mmol/L (ref 98–111)
Creatinine, Ser: 0.82 mg/dL (ref 0.61–1.24)
GFR, Estimated: >60 mL/min (ref >60)
Glucose, Bld: 102 mg/dL — ABNORMAL HIGH (ref 70–99)
Potassium: 3.9 mmol/L (ref 3.5–5.1)
Sodium: 134 mmol/L — ABNORMAL LOW (ref 135–145)

## 2021-12-01 LAB — LACTATE DEHYDROGENASE: LDH: 111 U/L (ref 98–192)

## 2021-12-01 MED ORDER — IRON SUCROSE 20 MG/ML IV SOLN
200.0000 mg | Freq: Once | INTRAVENOUS | Status: AC
  Administered 2021-12-01: 200 mg via INTRAVENOUS
  Filled 2021-12-01: qty 10

## 2021-12-01 MED ORDER — ACETAMINOPHEN 325 MG PO TABS
650.0000 mg | ORAL_TABLET | Freq: Once | ORAL | Status: AC
  Administered 2021-12-01: 650 mg via ORAL
  Filled 2021-12-01: qty 2

## 2021-12-01 MED ORDER — DIPHENHYDRAMINE HCL 25 MG PO CAPS
50.0000 mg | ORAL_CAPSULE | Freq: Once | ORAL | Status: AC
  Administered 2021-12-01: 50 mg via ORAL
  Filled 2021-12-01: qty 2

## 2021-12-01 MED ORDER — SODIUM CHLORIDE 0.9 % IV SOLN
Freq: Once | INTRAVENOUS | Status: AC
  Filled 2021-12-01: qty 250

## 2021-12-01 MED ORDER — SODIUM CHLORIDE 0.9 % IV SOLN: 200.0000 mg | Freq: Once | INTRAVENOUS | Status: DC

## 2021-12-01 NOTE — Progress Notes (Signed)
Patient had right hip replacement in 02/2021 but still having pain/inflammation. ?

## 2021-12-01 NOTE — Assessment & Plan Note (Addendum)
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 Hydrea 500 mg one a day-platelets improved at  279.  Continue Hydrea 500 mg a day; low clinical suspicion for any acute process.  However see below ? ?# Iron deficient anemia- ? etiology [OCT 2022- ferritin-6; iron sat 3%]-s/p IV iron infusion [pre-meds- Tylenol/benadryl]-hemoglobin today is  9.3.  Proceed with Venofer.   ? ?# ETIOLOGY: The etiology is unclear-unlikely from hip surgery given the surgery was almost 9 months ago.  Again hemorrhoidal bleeding-which is improved/should typically not cause to have iron deficiency.  Had endoscopies/sigmoidoscopy in 2021.  I have reached out to Dr. Alice Reichert to discuss further.  Given the unclear etiology of the ongoing anemia-I think is reasonable to proceed with the bone marrow biopsy for further evaluation.  ? ?Discussed with the patient the bone marrow biopsy and aspiration indication and procedure at length.  Given significant discomfort involved-I would recommend under anesthesia/with radiology in the hospital. I discussed the potential complications include-bleeding/trauma and risk of infection; which are fortunately very rare.  Patient is in agreement.  Bone marrow biopsy/aspiration is ordered.  ? ? ?# DISPOSITION: ? # please add LDH; iron studies/ferritin to today labs ?# venofer today; ?# Bone marrow biopsy in 2 weeks ?# follow up in 4 weeks MD; labs- cbc/bmp/LDH-venofer-Dr.B ?

## 2021-12-01 NOTE — Patient Instructions (Signed)
[facility]  Discharge Instructions: ?Thank you for choosing [facility] to provide your oncology and hematology care.  ?If you have a lab appointment with the Cancer Center, please go directly to the Cancer Center and check in at the registration area. ? ?Wear comfortable clothing and clothing appropriate for easy access to any Portacath or PICC line.  ? ?We strive to give you quality time with your provider. You may need to reschedule your appointment if you arrive late (15 or more minutes).  Arriving late affects you and other patients whose appointments are after yours.  Also, if you miss three or more appointments without notifying the office, you may be dismissed from the clinic at the provider?s discretion.    ?  ?For prescription refill requests, have your pharmacy contact our office and allow 72 hours for refills to be completed.   ? ?Today you received the following chemotherapy and/or immunotherapy agents VENOFER ?    ?  ?To help prevent nausea and vomiting after your treatment, we encourage you to take your nausea medication as directed. ? ?BELOW ARE SYMPTOMS THAT SHOULD BE REPORTED IMMEDIATELY: ?*FEVER GREATER THAN 100.4 F (38 ?C) OR HIGHER ?*CHILLS OR SWEATING ?*NAUSEA AND VOMITING THAT IS NOT CONTROLLED WITH YOUR NAUSEA MEDICATION ?*UNUSUAL SHORTNESS OF BREATH ?*UNUSUAL BRUISING OR BLEEDING ?*URINARY PROBLEMS (pain or burning when urinating, or frequent urination) ?*BOWEL PROBLEMS (unusual diarrhea, constipation, pain near the anus) ?TENDERNESS IN MOUTH AND THROAT WITH OR WITHOUT PRESENCE OF ULCERS (sore throat, sores in mouth, or a toothache) ?UNUSUAL RASH, SWELLING OR PAIN  ?UNUSUAL VAGINAL DISCHARGE OR ITCHING  ? ?Items with * indicate a potential emergency and should be followed up as soon as possible or go to the Emergency Department if any problems should occur. ? ?Please show the CHEMOTHERAPY ALERT CARD or IMMUNOTHERAPY ALERT CARD at check-in to  the Emergency Department and triage nurse. ? ?Should you have questions after your visit or need to cancel or reschedule your appointment, please contact [facility]  [phone] and follow the prompts.  Office hours are 8:00 a.m. to 4:30 p.m. Monday - Friday. Please note that voicemails left after 4:00 p.m. may not be returned until the following business day.  We are closed weekends and major holidays. You have access to a nurse at all times for urgent questions. Please call the main number to the clinic [phone] and follow the prompts. ? ?For any non-urgent questions, you may also contact your provider using MyChart. We now offer e-Visits for anyone 18 and older to request care online for non-urgent symptoms. For details visit [email or web address]. ?  ?Also download the MyChart app! Go to the app store, search "MyChart", open the app, select [facility], and log in with your MyChart username and password. ? ?Due to Covid, a mask is required upon entering the hospital/clinic. If you do not have a mask, one will be given to you upon arrival. For doctor visits, patients may have 1 support person aged 18 or older with them. For treatment visits, patients cannot have anyone with them due to current Covid guidelines and our immunocompromised population.  ? ?Iron Sucrose Injection ?What is this medication? ?IRON SUCROSE (EYE ern SOO krose) treats low levels of iron (iron deficiency anemia) in people with kidney disease. Iron is a mineral that plays an important role in making red blood cells, which carry oxygen from your lungs to the rest of your body. ?This medicine   may be used for other purposes; ask your health care provider or pharmacist if you have questions. ?COMMON BRAND NAME(S): Venofer ?What should I tell my care team before I take this medication? ?They need to know if you have any of these conditions: ?Anemia not caused by low iron levels ?Heart disease ?High levels of  iron in the blood ?Kidney disease ?Liver disease ?An unusual or allergic reaction to iron, other medications, foods, dyes, or preservatives ?Pregnant or trying to get pregnant ?Breast-feeding ?How should I use this medication? ?This medication is for infusion into a vein. It is given in a hospital or clinic setting. ?Talk to your care team about the use of this medication in children. While this medication may be prescribed for children as young as 2 years for selected conditions, precautions do apply. ?Overdosage: If you think you have taken too much of this medicine contact a poison control center or emergency room at once. ?NOTE: This medicine is only for you. Do not share this medicine with others. ?What if I miss a dose? ?It is important not to miss your dose. Call your care team if you are unable to keep an appointment. ?What may interact with this medication? ?Do not take this medication with any of the following: ?Deferoxamine ?Dimercaprol ?Other iron products ?This medication may also interact with the following: ?Chloramphenicol ?Deferasirox ?This list may not describe all possible interactions. Give your health care provider a list of all the medicines, herbs, non-prescription drugs, or dietary supplements you use. Also tell them if you smoke, drink alcohol, or use illegal drugs. Some items may interact with your medicine. ?What should I watch for while using this medication? ?Visit your care team regularly. Tell your care team if your symptoms do not start to get better or if they get worse. You may need blood work done while you are taking this medication. ?You may need to follow a special diet. Talk to your care team. Foods that contain iron include: whole grains/cereals, dried fruits, beans, or peas, leafy green vegetables, and organ meats (liver, kidney). ?What side effects may I notice from receiving this medication? ?Side effects that you should report to your care team as soon as  possible: ?Allergic reactions--skin rash, itching, hives, swelling of the face, lips, tongue, or throat ?Low blood pressure--dizziness, feeling faint or lightheaded, blurry vision ?Shortness of breath ?Side effects that usually do not require medical attention (report to your care team if they continue or are bothersome): ?Flushing ?Headache ?Joint pain ?Muscle pain ?Nausea ?Pain, redness, or irritation at injection site ?This list may not describe all possible side effects. Call your doctor for medical advice about side effects. You may report side effects to FDA at 1-800-FDA-1088. ?Where should I keep my medication? ?This medication is given in a hospital or clinic and will not be stored at home. ?NOTE: This sheet is a summary. It may not cover all possible information. If you have questions about this medicine, talk to your doctor, pharmacist, or health care provider. ?? 2023 Elsevier/Gold Standard (2020-12-26 00:00:00) ? ?

## 2021-12-01 NOTE — Progress Notes (Signed)
[facility] ?OFFICE PROGRESS NOTE ? ?Patient Care Team: ?Derinda Late, MD as PCP - General (Family Medicine) ?Hulan Fess, MD (Family Medicine) ? ? ?SUMMARY OF ONCOLOGIC HISTORY: ? 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 ?# # EGD- [Dr.Ganum; [place]]. On zantac.   ? ?# Left hip replacement ?------------------------------------------------  ? ?DIAGNOSIS: '[ ]'$  ? ?STAGE:         ;GOALS: ? ?CURRENT/MOST RECENT THERAPY '[ ]'$  ? ?  ?  ?Essential thrombocythemia ([place])  ? ? ? ?INTERVAL HISTORY: ? ?A very pleasant 66 year-old male patient with above history of essential thrombocytosis currently on low-dose Hydrea; and also iron deficiency anemia is here for follow-up. ? ?Patient continues to complain of fatigue.  He is not on any iron supplementation.  ? ?He continues to complain of ongoing hip pain postsurgery.  ? ?Continues to have intermittent blood in his stools which is attributed to hemorrhoidal bleeding; which was present even before the orthopedic surgery.  The hemorrhoidal bleeding is improved in the last few months. ? ?Review of Systems  ?Constitutional:  Negative for chills, diaphoresis, fever, malaise/fatigue and weight loss.  ?HENT:  Negative for nosebleeds and sore throat.   ?Eyes:  Negative for double vision.  ?Respiratory:  Negative for cough, hemoptysis, sputum production, shortness of breath and wheezing.   ?Cardiovascular:  Negative for chest pain, palpitations, orthopnea and leg swelling.  ?Gastrointestinal:  Negative for abdominal pain, blood in stool, constipation, diarrhea, heartburn, melena, nausea and vomiting.  ?Genitourinary:  Negative for dysuria, frequency and urgency.  ?Musculoskeletal:  Negative for back pain and joint pain.  ?Skin: Negative.  Negative for itching and rash.   ?Neurological:  Negative for dizziness, tingling, focal weakness, weakness and headaches.  ?Endo/Heme/Allergies:  Does not bruise/bleed easily.  ?Psychiatric/Behavioral:  Negative for depression. The patient is not nervous/anxious and does not have insomnia.   ?  ? ?PAST MEDICAL HISTORY :  ?Past Medical History:  ?Diagnosis Date  ? Arthritis   ? osteoarthritis -hips,spine,hands and left wrist.  ? Arthritis   ? Complication of anesthesia   ? GERD (gastroesophageal reflux disease)   ? Pneumonia   ? PONV (postoperative nausea and vomiting)   ? Sleep apnea   ? Thrombocytosis   ? Unspecified diseases of blood and blood-forming organs   ? "essential hyperthrombocytosis"  ? ? ?PAST SURGICAL HISTORY :   ?Past Surgical History:  ?Procedure Laterality Date  ? BACK SURGERY  June 2015  ? ROTATOR CUFF REPAIR    ? x3(lt x2), rt. x1  ? TOTAL HIP ARTHROPLASTY Left 02/11/2014  ? Procedure: LEFT TOTAL HIP ARTHROPLASTY ANTERIOR APPROACH;  Surgeon: Gearlean Alf, MD;  Location: [facility];  Service: Orthopedics;  Laterality: Left;  ? TOTAL HIP ARTHROPLASTY Right 03/11/2021  ? Procedure: TOTAL HIP ARTHROPLASTY ANTERIOR APPROACH;  Surgeon: Gaynelle Arabian, MD;  Location: [facility];  Service: Orthopedics;  Laterality: Right;  ? ? ?FAMILY HISTORY :   ?Family History  ?Problem Relation Age of Onset  ? Cancer Maternal Aunt   ? Cancer Paternal Aunt   ? Stroke Maternal Grandfather   ? ? ?SOCIAL HISTORY:   ?Social History  ? ?Tobacco Use  ? Smoking status: Never  ? Smokeless tobacco: Never  ?Vaping Use  ? Vaping Use: Never used  ?Substance Use Topics  ? Alcohol use: Yes  ?  Alcohol/week: 2.0 standard drinks  ?  Types: 2 Cans of beer per week  ?  Comment: beer/ wine 2glasses  per night  ? Drug use: No  ? ? ?ALLERGIES:  is allergic to hornet venom, wasp venom protein, vicodin [hydrocodone-acetaminophen], other, and penicillins. ? ?MEDICATIONS:  ?Current Outpatient Medications  ?Medication Sig Dispense Refill  ? acetaminophen (TYLENOL) 500 MG tablet Take  500 mg by mouth every 6 (six) hours as needed.    ? albuterol (VENTOLIN HFA) 108 (90 Base) MCG/ACT inhaler Inhale 2 puffs into the lungs every 6 (six) hours as needed for wheezing or shortness of breath. 8 g 2  ? aspirin EC 81 MG tablet Take 81 mg by mouth daily. Swallow whole.    ? cyclobenzaprine (FLEXERIL) 5 MG tablet Take 2 tablets (10 mg total) by mouth 3 (three) times daily as needed for muscle spasms. 40 tablet 0  ? EPINEPHrine 0.3 mg/0.3 mL IJ SOAJ injection Inject 0.3 mg into the muscle as needed for anaphylaxis.  0  ? fluticasone (FLONASE) 50 MCG/ACT nasal spray Place 1 spray into both nostrils daily.    ? hydroxyurea (HYDREA) 500 MG capsule Take 1 capsule (500 mg total) by mouth daily. TAKE TWO CAPSULES DAILY ON MONDAYS, [place], AND FRIDAYS. TAKE ONLY ONE CAPSULE DAILY ON OTHER DAYS OF THE WEEK (Patient taking differently: Take 500 mg by mouth daily. Patient is taking 1 QD) 90 capsule 3  ? loratadine (CLARITIN) 10 MG tablet Take 10 mg by mouth daily.    ? metaxalone (SKELAXIN) 800 MG tablet Take 800 mg by mouth 3 (three) times daily.    ? sildenafil (REVATIO) 20 MG tablet Take 60 mg by mouth daily as needed (ED).    ? traMADol (ULTRAM) 50 MG tablet Take 1-2 tablets (50-100 mg total) by mouth every 6 (six) hours as needed for moderate pain. 40 tablet 0  ? benzonatate (TESSALON PERLES) 100 MG capsule Take 1 capsule (100 mg total) by mouth 3 (three) times daily as needed for cough. (Patient not taking: Reported on 05/26/2021) 20 capsule 0  ? chlorproMAZINE (THORAZINE) 25 MG tablet  (Patient not taking: Reported on 07/20/2021)    ? ibuprofen (ADVIL) 200 MG tablet Take 200 mg by mouth every 6 (six) hours as needed. (Patient not taking: Reported on 07/20/2021)    ? ondansetron (ZOFRAN) 4 MG tablet Take 1 tablet (4 mg total) by mouth every 6 (six) hours as needed for nausea. (Patient not taking: Reported on 05/12/2021) 20 tablet 0  ? oxyCODONE (OXY IR/ROXICODONE) 5 MG immediate release tablet Take 1-2 tablets  (5-10 mg total) by mouth every 6 (six) hours as needed for severe pain. (Patient not taking: Reported on 05/12/2021) 42 tablet 0  ? ?No current facility-administered medications for this visit.  ? ?Facility-Administered Medications Ordered in Other Visits  ?Medication Dose Route Frequency Provider Last Rate Last Admin  ? 0.9 %  sodium chloride infusion   Intravenous Once Cammie Sickle, MD      ? acetaminophen (TYLENOL) tablet 650 mg  650 mg Oral Once Cammie Sickle, MD      ? diphenhydrAMINE (BENADRYL) capsule 50 mg  50 mg Oral Once Cammie Sickle, MD      ? iron sucrose (VENOFER) 200 mg in sodium chloride 0.9 % 100 mL IVPB  200 mg Intravenous Once Cammie Sickle, MD      ? ? ?PHYSICAL EXAMINATION: ?ECOG PERFORMANCE STATUS: 0 - Asymptomatic ? ?BP 107/64   Pulse 72  Temp 98.2 ?F (36.8 ?C)   Resp 18   Wt 189 lb 3.2 oz (85.8 kg)   BMI 27.15 kg/m?  ? ?Filed Weights  ? 12/01/21 1300  ?Weight: 189 lb 3.2 oz (85.8 kg)  ? ? ?Physical Exam ?HENT:  ?   Head: Normocephalic and atraumatic.  ?   Mouth/Throat:  ?   Pharynx: No oropharyngeal exudate.  ?Eyes:  ?   Pupils: Pupils are equal, round, and reactive to light.  ?Cardiovascular:  ?   Rate and Rhythm: Normal rate and regular rhythm.  ?Pulmonary:  ?   Effort: No respiratory distress.  ?   Breath sounds: No wheezing.  ?Abdominal:  ?   General: Bowel sounds are normal. There is no distension.  ?   Palpations: Abdomen is soft. There is no mass.  ?   Tenderness: There is no abdominal tenderness. There is no guarding or rebound.  ?Musculoskeletal:     ?   General: No tenderness. Normal range of motion.  ?   Cervical back: Normal range of motion and neck supple.  ?Skin: ?   General: Skin is warm.  ?Neurological:  ?   Mental Status: He is alert and oriented to person, place, and time.  ?Psychiatric:     ?   Mood and Affect: Affect normal.  ? ?LABORATORY DATA:  ?I have reviewed the data as listed ?   ?Component Value Date/Time  ? NA 134 (L)  12/01/2021 1314  ? K 3.9 12/01/2021 1314  ? CL 104 12/01/2021 1314  ? CO2 24 12/01/2021 1314  ? GLUCOSE 102 (H) 12/01/2021 1314  ? BUN 21 12/01/2021 1314  ? CREATININE 0.82 12/01/2021 1314  ? CREATININE 0.86 02/28/2014 1

## 2021-12-01 NOTE — Telephone Encounter (Signed)
Order faxed to special sch for BMB. Awaiting appt. ?

## 2021-12-02 NOTE — Telephone Encounter (Signed)
Appt sch'd 12/14/21 at 8:30 am with an arrival time of 7:30 am. Pt notified. ?

## 2021-12-03 ENCOUNTER — Encounter: Payer: Self-pay | Admitting: Internal Medicine

## 2021-12-14 ENCOUNTER — Ambulatory Visit: Payer: BC Managed Care – PPO

## 2021-12-14 ENCOUNTER — Other Ambulatory Visit: Payer: Self-pay | Admitting: Internal Medicine

## 2021-12-15 ENCOUNTER — Ambulatory Visit
Admission: RE | Admit: 2021-12-15 | Discharge: 2021-12-15 | Disposition: A | Discharge status: Home / Self Care | Payer: BC Managed Care – PPO | Source: Ambulatory Visit | Attending: Internal Medicine | Admitting: Internal Medicine

## 2021-12-15 ENCOUNTER — Encounter: Payer: Self-pay | Admitting: Internal Medicine

## 2021-12-15 DIAGNOSIS — D696 Thrombocytopenia, unspecified: Secondary | ICD-10-CM | POA: Diagnosis present

## 2021-12-15 DIAGNOSIS — D75839 Thrombocytosis, unspecified: Secondary | ICD-10-CM | POA: Insufficient documentation

## 2021-12-15 DIAGNOSIS — K219 Gastro-esophageal reflux disease without esophagitis: Secondary | ICD-10-CM | POA: Diagnosis not present

## 2021-12-15 DIAGNOSIS — D509 Iron deficiency anemia, unspecified: Secondary | ICD-10-CM | POA: Diagnosis not present

## 2021-12-15 DIAGNOSIS — Z96643 Presence of artificial hip joint, bilateral: Secondary | ICD-10-CM | POA: Insufficient documentation

## 2021-12-15 DIAGNOSIS — K625 Hemorrhage of anus and rectum: Secondary | ICD-10-CM | POA: Insufficient documentation

## 2021-12-15 DIAGNOSIS — D473 Essential (hemorrhagic) thrombocythemia: Secondary | ICD-10-CM

## 2021-12-15 LAB — CBC WITH DIFFERENTIAL/PLATELET
Abs Immature Granulocytes: 0.02 10*3/uL (ref 0.00–0.07)
Basophils Absolute: 0.2 10*3/uL — ABNORMAL HIGH (ref 0.0–0.1)
Basophils Relative: 3 %
Eosinophils Absolute: 0.3 10*3/uL (ref 0.0–0.5)
Eosinophils Relative: 5 %
HCT: 33.2 % — ABNORMAL LOW (ref 39.0–52.0)
Hemoglobin: 10 g/dL — ABNORMAL LOW (ref 13.0–17.0)
Immature Granulocytes: 0 %
Lymphocytes Relative: 24 %
Lymphs Abs: 1.5 10*3/uL (ref 0.7–4.0)
MCH: 25.4 pg — ABNORMAL LOW (ref 26.0–34.0)
MCHC: 30.1 g/dL (ref 30.0–36.0)
MCV: 84.3 fL (ref 80.0–100.0)
Monocytes Absolute: 0.7 10*3/uL (ref 0.1–1.0)
Monocytes Relative: 12 %
Neutro Abs: 3.4 10*3/uL (ref 1.7–7.7)
Neutrophils Relative %: 56 %
Platelets: 793 10*3/uL — ABNORMAL HIGH (ref 150–400)
RBC: 3.94 MIL/uL — ABNORMAL LOW (ref 4.22–5.81)
RDW: 22.3 % — ABNORMAL HIGH (ref 11.5–15.5)
Smear Review: NORMAL
WBC: 6.1 10*3/uL (ref 4.0–10.5)
nRBC: 0 % (ref 0.0–0.2)

## 2021-12-15 MED ORDER — FENTANYL CITRATE (PF) 100 MCG/2ML IJ SOLN
INTRAMUSCULAR | Status: AC
  Filled 2021-12-15: qty 2

## 2021-12-15 MED ORDER — SODIUM CHLORIDE 0.9 % IV SOLN: INTRAVENOUS | Status: DC

## 2021-12-15 MED ORDER — MIDAZOLAM HCL 2 MG/2ML IJ SOLN
INTRAMUSCULAR | Status: AC | PRN
  Administered 2021-12-15: 1 mg via INTRAVENOUS
  Administered 2021-12-15: .5 mg via INTRAVENOUS

## 2021-12-15 MED ORDER — FENTANYL CITRATE (PF) 100 MCG/2ML IJ SOLN
INTRAMUSCULAR | Status: AC | PRN
  Administered 2021-12-15: 50 ug via INTRAVENOUS
  Administered 2021-12-15: 25 ug via INTRAVENOUS

## 2021-12-15 MED ORDER — ONDANSETRON HCL 4 MG/2ML IJ SOLN
INTRAMUSCULAR | Status: AC
  Administered 2021-12-15: 4 mg via INTRAVENOUS
  Filled 2021-12-15: qty 2

## 2021-12-15 MED ORDER — ONDANSETRON HCL 4 MG/2ML IJ SOLN
4.0000 mg | Freq: Once | INTRAMUSCULAR | Status: AC
  Filled 2021-12-15: qty 2

## 2021-12-15 MED ORDER — MIDAZOLAM HCL 2 MG/2ML IJ SOLN
INTRAMUSCULAR | Status: AC
  Filled 2021-12-15: qty 2

## 2021-12-15 MED ORDER — HEPARIN SOD (PORK) LOCK FLUSH 100 UNIT/ML IV SOLN
INTRAVENOUS | Status: AC
  Filled 2021-12-15: qty 5

## 2021-12-15 NOTE — Progress Notes (Signed)
Patient clinically stable post BMB per Dr Denna Haggard, tolerated well, vitals stable pre and post procedure. Received Versed 1.5 mg along with Fentanyl 75 mcg IV for procedure. Report given to [facility] RN post procedure/specials .  ?

## 2021-12-15 NOTE — Procedures (Signed)
Interventional Radiology Procedure Note ? ?Date of Procedure: 12/15/2021  ?Procedure: CT BMBx ? ?Findings:  ?1. BMBx, left posterior ilium   ? ?Complications: No immediate complications noted.  ? ?Estimated Blood Loss: minimal ? ?Follow-up and Recommendations: ?1. Bedrest 1 hour  ? ? ?Albin Felling, MD  ?Vascular & Interventional Radiology  ?12/15/2021 10:14 AM ? ? ? ?

## 2021-12-15 NOTE — H&P (Signed)
? ?Chief Complaint: ?Thrombocytopenia and anemia of unknown etiology. Request is for bone marrow biopsy ? ?Referring Physician(s): ?Brahmanday,Govinda R ? ?Supervising Physician: Juliet Rude ? ?Patient Status: [name] ? ?History of Present Illness: ?Joshua Cabrera is a 66 y.o. male outpatient. 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  ? ?Currently without any significant complaints. Patient alert and laying in bed, calm and comfortable. Denies any fevers, headache, chest pain, SOB, cough, abdominal pain, nausea, vomiting or bleeding. Return precautions and treatment recommendations and follow-up discussed with the patient  who is agreeable with the plan. ? ? ? ?Past Medical History:  ?Diagnosis Date  ? Arthritis   ? osteoarthritis -hips,spine,hands and left wrist.  ? Arthritis   ? Complication of anesthesia   ? GERD (gastroesophageal reflux disease)   ? Pneumonia   ? PONV (postoperative nausea and vomiting)   ? Sleep apnea   ? Thrombocytosis   ? Unspecified diseases of blood and blood-forming organs   ? "essential hyperthrombocytosis"  ? ? ?Past Surgical History:  ?Procedure Laterality Date  ? BACK SURGERY  June 2015  ? ROTATOR CUFF REPAIR    ? x3(lt x2), rt. x1  ? TOTAL HIP ARTHROPLASTY Left 02/11/2014  ? Procedure: LEFT TOTAL HIP ARTHROPLASTY ANTERIOR APPROACH;  Surgeon: Gearlean Alf, MD;  Location: [facility];  Service: Orthopedics;  Laterality: Left;  ? TOTAL HIP ARTHROPLASTY Right 03/11/2021  ? Procedure: TOTAL HIP ARTHROPLASTY ANTERIOR APPROACH;  Surgeon: Gaynelle Arabian, MD;  Location: [facility];  Service: Orthopedics;  Laterality: Right;  ? ? ?Allergies: ?Hornet venom, Wasp venom protein, Vicodin [hydrocodone-acetaminophen], Other, and Penicillins ? ?Medications: ?Prior to Admission medications   ?Medication  Sig Start Date End Date Taking? Authorizing Provider  ?acetaminophen (TYLENOL) 500 MG tablet Take 500 mg by mouth every 6 (six) hours as needed.   Yes [provider]  ?albuterol (VENTOLIN HFA) 108 (90 Base) MCG/ACT inhaler Inhale 2 puffs into the lungs every 6 (six) hours as needed for wheezing or shortness of breath. 05/25/21  Yes Ratcliffe, Heather R, PA-C  ?aspirin EC 81 MG tablet Take 81 mg by mouth daily. Swallow whole.   Yes [provider]  ?cyclobenzaprine (FLEXERIL) 5 MG tablet Take 2 tablets (10 mg total) by mouth 3 (three) times daily as needed for muscle spasms. 03/13/21  Yes Edmisten, Kristie L, PA  ?fluticasone (FLONASE) 50 MCG/ACT nasal spray Place 1 spray into both nostrils daily.   Yes [provider]  ?loratadine (CLARITIN) 10 MG tablet Take 10 mg by mouth daily.   Yes [provider]  ?metaxalone (SKELAXIN) 800 MG tablet Take 800 mg by mouth 3 (three) times daily. 06/17/21  Yes [provider]  ?sildenafil (REVATIO) 20 MG tablet Take 60 mg by mouth daily as needed (ED).   Yes [provider]  ?benzonatate (TESSALON PERLES) 100 MG capsule Take 1 capsule (100 mg total) by mouth 3 (three) times daily as needed for cough. ?Patient not taking: Reported on 05/26/2021 05/25/21   Daryll Drown R, PA-C  ?chlorproMAZINE (THORAZINE) 25 MG tablet     [provider]  ?EPINEPHrine 0.3 mg/0.3 mL IJ SOAJ injection Inject 0.3 mg into the muscle as needed for anaphylaxis. 04/17/18   [provider]  ?hydroxyurea (HYDREA) 500 MG capsule Take 1 capsule (500 mg total) by mouth daily. TAKE TWO CAPSULES  DAILY ON MONDAYS, [place], AND FRIDAYS. TAKE ONLY ONE CAPSULE DAILY ON OTHER DAYS OF THE WEEK ?Patient taking differently: Take 500 mg by mouth daily. Patient is taking 1 QD 07/07/21   Cammie Sickle, MD  ?ibuprofen (ADVIL) 200 MG tablet Take 200 mg by mouth every 6 (six) hours as needed. ?Patient not taking: Reported on 07/20/2021     [provider]  ?ondansetron (ZOFRAN) 4 MG tablet Take 1 tablet (4 mg total) by mouth every 6 (six) hours as needed for nausea. ?Patient not taking: Reported on 05/12/2021 03/13/21   Theresa Duty L, PA  ?oxyCODONE (OXY IR/ROXICODONE) 5 MG immediate release tablet Take 1-2 tablets (5-10 mg total) by mouth every 6 (six) hours as needed for severe pain. ?Patient not taking: Reported on 05/12/2021 03/12/21   Derl Barrow, PA  ?traMADol (ULTRAM) 50 MG tablet Take 1-2 tablets (50-100 mg total) by mouth every 6 (six) hours as needed for moderate pain. ?Patient not taking: Reported on 12/15/2021 03/12/21   Derl Barrow, PA  ?  ? ?Family History  ?Problem Relation Age of Onset  ? Cancer Maternal Aunt   ? Cancer Paternal Aunt   ? Stroke Maternal Grandfather   ? ? ?Social History  ? ?Socioeconomic History  ? Marital status: Married  ?  Spouse name: Not on file  ? Number of children: Not on file  ? Years of education: Not on file  ? Highest education level: Not on file  ?Occupational History  ? Not on file  ?Tobacco Use  ? Smoking status: Never  ? Smokeless tobacco: Never  ?Vaping Use  ? Vaping Use: Never used  ?Substance and Sexual Activity  ? Alcohol use: Yes  ?  Alcohol/week: 2.0 standard drinks  ?  Types: 2 Cans of beer per week  ?  Comment: beer/ wine 2glasses  per night  ? Drug use: No  ? Sexual activity: Yes  ?Other Topics Concern  ? Not on file  ?Social History Narrative  ? ** Merged History Encounter **  ?    ? ?Social Determinants of Health  ? ?Financial Resource Strain: Not on file  ?Food Insecurity: Not on file  ?Transportation Needs: Not on file  ?Physical Activity: Not on file  ?Stress: Not on file  ?Social Connections: Not on file  ? ? ?Review of Systems: A 12 point ROS discussed and pertinent positives are indicated in the HPI above.  All other systems are negative. ? ?Review of Systems  ?Constitutional:  Negative for fever.  ?HENT:  Negative for congestion.   ?Respiratory:  Negative for  cough and shortness of breath.   ?Cardiovascular:  Negative for chest pain.  ?Gastrointestinal:  Negative for abdominal pain.  ?Neurological:  Negative for headaches.  ?Psychiatric/Behavioral:  Negative for behavioral problems and confusion.   ? ?Vital Signs: ?BP 108/79   Pulse 66   Temp 98.4 ?F (36.9 ?C) (Oral)   Resp 17   Ht 5' 9"  (1.753 m)   Wt 185 lb (83.9 kg)   SpO2 97%   BMI 27.32 kg/m?  ? ?Physical Exam ?Vitals and nursing note reviewed.  ?Constitutional:   ?   Appearance: He is well-developed.  ?HENT:  ?   Head: Normocephalic.  ?Cardiovascular:  ?   Rate and Rhythm: Normal rate and regular rhythm.  ?   Heart sounds: Normal heart sounds.  ?Pulmonary:  ?   Effort: Pulmonary effort is normal.  ?   Breath sounds: Normal breath sounds.  ?  Musculoskeletal:     ?   General: Normal range of motion.  ?   Cervical back: Normal range of motion.  ?Skin: ?   General: Skin is dry.  ?Neurological:  ?   Mental Status: He is alert and oriented to person, place, and time.  ? ? ?Imaging: ?No results found. ? ?Labs: ? ?CBC: ?Recent Labs  ?  06/01/21 ?1441 06/08/21 ?1350 07/20/21 ?1331 12/01/21 ?1314  ?WBC 7.7 9.7 7.7 7.1  ?HGB 8.4* 9.2* 11.2* 9.3*  ?HCT 28.4* 31.1* 36.5* 30.8*  ?PLT 923* 449* 350 279  ? ? ?COAGS: ?Recent Labs  ?  02/27/21 ?1202  ?INR 0.9  ? ? ?BMP: ?Recent Labs  ?  03/13/21 ?0342 05/12/21 ?1434 06/08/21 ?1350 12/01/21 ?1314  ?NA 132* 135 133* 134*  ?K 3.6 3.9 3.9 3.9  ?CL 100 101 100 104  ?CO2 26 25 26 24   ?GLUCOSE 125* 101* 100* 102*  ?BUN 12 19 22 21   ?CALCIUM 8.4* 9.1 9.0 8.9  ?CREATININE 0.58* 0.92 0.78 0.82  ?GFRNONAA >60 >60 >60 >60  ? ? ?LIVER FUNCTION TESTS: ?Recent Labs  ?  02/27/21 ?1202 05/12/21 ?1434  ?BILITOT 1.2 0.7  ?AST 20 19  ?ALT 15 14  ?ALKPHOS 53 67  ?PROT 7.1 7.2  ?ALBUMIN 3.8 3.9  ? ? ?Assessment and Plan: ? ?66 y.o. male outpatient. 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  ? ?Labs from today pending. Labs from 4.18.23 within acceptable parameters. Patient is on 81 mg of ASA. Pertinent allergies

## 2021-12-16 LAB — SURGICAL PATHOLOGY

## 2021-12-17 LAB — SURGICAL PATHOLOGY

## 2021-12-24 ENCOUNTER — Encounter (HOSPITAL_COMMUNITY): Payer: Self-pay | Admitting: Internal Medicine

## 2021-12-28 ENCOUNTER — Encounter: Payer: Self-pay | Admitting: Internal Medicine

## 2021-12-28 ENCOUNTER — Inpatient Hospital Stay (HOSPITAL_BASED_OUTPATIENT_CLINIC_OR_DEPARTMENT_OTHER): Payer: BC Managed Care – PPO | Admitting: Internal Medicine

## 2021-12-28 ENCOUNTER — Ambulatory Visit: Payer: BC Managed Care – PPO | Admitting: Internal Medicine

## 2021-12-28 ENCOUNTER — Inpatient Hospital Stay: Payer: BC Managed Care – PPO | Attending: Internal Medicine

## 2021-12-28 ENCOUNTER — Other Ambulatory Visit: Payer: BC Managed Care – PPO

## 2021-12-28 ENCOUNTER — Ambulatory Visit: Payer: BC Managed Care – PPO

## 2021-12-28 ENCOUNTER — Inpatient Hospital Stay: Payer: BC Managed Care – PPO

## 2021-12-28 VITALS — BP 114/66 | HR 67 | Temp 98.2°F | Resp 18

## 2021-12-28 DIAGNOSIS — M2550 Pain in unspecified joint: Secondary | ICD-10-CM | POA: Insufficient documentation

## 2021-12-28 DIAGNOSIS — Z809 Family history of malignant neoplasm, unspecified: Secondary | ICD-10-CM | POA: Diagnosis not present

## 2021-12-28 DIAGNOSIS — R5383 Other fatigue: Secondary | ICD-10-CM | POA: Insufficient documentation

## 2021-12-28 DIAGNOSIS — Z885 Allergy status to narcotic agent status: Secondary | ICD-10-CM | POA: Diagnosis not present

## 2021-12-28 DIAGNOSIS — D649 Anemia, unspecified: Secondary | ICD-10-CM

## 2021-12-28 DIAGNOSIS — D509 Iron deficiency anemia, unspecified: Secondary | ICD-10-CM | POA: Diagnosis not present

## 2021-12-28 DIAGNOSIS — Z79899 Other long term (current) drug therapy: Secondary | ICD-10-CM | POA: Insufficient documentation

## 2021-12-28 DIAGNOSIS — D473 Essential (hemorrhagic) thrombocythemia: Secondary | ICD-10-CM

## 2021-12-28 DIAGNOSIS — M25552 Pain in left hip: Secondary | ICD-10-CM | POA: Insufficient documentation

## 2021-12-28 DIAGNOSIS — Z823 Family history of stroke: Secondary | ICD-10-CM | POA: Diagnosis not present

## 2021-12-28 DIAGNOSIS — D75839 Thrombocytosis, unspecified: Secondary | ICD-10-CM | POA: Insufficient documentation

## 2021-12-28 DIAGNOSIS — Z880 Allergy status to penicillin: Secondary | ICD-10-CM | POA: Diagnosis not present

## 2021-12-28 LAB — BASIC METABOLIC PANEL
Anion gap: 5 (ref 5–15)
BUN: 20 mg/dL (ref 8–23)
CO2: 26 mmol/L (ref 22–32)
Calcium: 8.9 mg/dL (ref 8.9–10.3)
Chloride: 104 mmol/L (ref 98–111)
Creatinine, Ser: 0.92 mg/dL (ref 0.61–1.24)
GFR, Estimated: >60 mL/min (ref >60)
Glucose, Bld: 104 mg/dL — ABNORMAL HIGH (ref 70–99)
Potassium: 4.2 mmol/L (ref 3.5–5.1)
Sodium: 135 mmol/L (ref 135–145)

## 2021-12-28 LAB — CBC WITH DIFFERENTIAL/PLATELET
Abs Immature Granulocytes: 0.03 10*3/uL (ref 0.00–0.07)
Basophils Absolute: 0.2 10*3/uL — ABNORMAL HIGH (ref 0.0–0.1)
Basophils Relative: 3 %
Eosinophils Absolute: 0.1 10*3/uL (ref 0.0–0.5)
Eosinophils Relative: 2 %
HCT: 32.1 % — ABNORMAL LOW (ref 39.0–52.0)
Hemoglobin: 9.8 g/dL — ABNORMAL LOW (ref 13.0–17.0)
Immature Granulocytes: 1 %
Lymphocytes Relative: 24 %
Lymphs Abs: 1.5 10*3/uL (ref 0.7–4.0)
MCH: 27 pg (ref 26.0–34.0)
MCHC: 30.5 g/dL (ref 30.0–36.0)
MCV: 88.4 fL (ref 80.0–100.0)
Monocytes Absolute: 0.7 10*3/uL (ref 0.1–1.0)
Monocytes Relative: 12 %
Neutro Abs: 3.6 10*3/uL (ref 1.7–7.7)
Neutrophils Relative %: 58 %
Platelets: 402 10*3/uL — ABNORMAL HIGH (ref 150–400)
RBC: 3.63 MIL/uL — ABNORMAL LOW (ref 4.22–5.81)
RDW: 23.9 % — ABNORMAL HIGH (ref 11.5–15.5)
WBC: 6.2 10*3/uL (ref 4.0–10.5)
nRBC: 0 % (ref 0.0–0.2)

## 2021-12-28 LAB — LACTATE DEHYDROGENASE: LDH: 115 U/L (ref 98–192)

## 2021-12-28 MED ORDER — SODIUM CHLORIDE 0.9 % IV SOLN: 200.0000 mg | Freq: Once | INTRAVENOUS | Status: DC

## 2021-12-28 MED ORDER — IRON SUCROSE 20 MG/ML IV SOLN
200.0000 mg | Freq: Once | INTRAVENOUS | Status: AC
  Administered 2021-12-28: 200 mg via INTRAVENOUS
  Filled 2021-12-28: qty 10

## 2021-12-28 MED ORDER — SODIUM CHLORIDE 0.9 % IV SOLN
Freq: Once | INTRAVENOUS | Status: AC
  Filled 2021-12-28: qty 250

## 2021-12-28 NOTE — Progress Notes (Signed)
Still having blood in stool. ? ?SOB with mild activity. ? ? ?

## 2021-12-28 NOTE — Progress Notes (Signed)
[place] ?OFFICE PROGRESS NOTE ? ?Patient Care Team: ?Derinda Late, MD as PCP - General (Family Medicine) ?Hulan Fess, MD (Family Medicine) ? ? ?SUMMARY OF ONCOLOGIC HISTORY: ? 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 ?April 2023 [worsening Iron def anemia/thrombocytosis]- BONE MARROW, ASPIRATE, CLOT, CORE:  ?-Hypercellular bone marrow with atypical megakaryocytic proliferation  ?-Slight plasmacytosis (6%)  ?-See comment  ? ?PERIPHERAL BLOOD:  ?-Normocytic-normochromic anemia  ?-Thrombocytosis  ? ?COMMENT:  ? ?The bone marrow is hypercellular for age with prominent increase in  ?megakaryocytes associated with many abnormal forms.  Significant  ?dyspoiesis in the erythroid or granulocytic cell lines is not seen and  ?no increase in blastic cells identified.  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  There is also slight plasmacytosis with lack of large  ?aggregates or sheets.  Special stains to assess plasma cell clonality  ?and degree of fibrosis will be performed and the results reported in an  ?addendum.  Correlation with cytogenetic studies is recommended.  ? ?MICROSCOPIC DESCRIPTION:  ? ? ?PERIPHERAL BLOOD SMEAR: The red blood cells display mild to moderate  ?anisopoikilocytosis with microcytic cells, elliptocytes, teardrop cells,  ?target cells.  There is moderate polychromasia.  The white blood cells  ?are normal in number with scattered hypogranular neutrophils.  The  ?platelets are increased in number.  ? ?BONE MARROW ASPIRATE: Bone marrow particles present  ?Erythroid  precursors: Orderly and progressive maturation  ?Granulocytic precursors: Orderly and progressive maturation for the most  ?part.  No increase in blastic cells identified  ?Megakaryocytes: Increased in number with many large, atypical forms, and  ?small and/or hypolobated cells.  ?Lymphocytes/plasma cells: The plasma cells are slightly increased in  ?number representing 6% of all cells with lack of large aggregates or  ?sheets.  Large lymphoid aggregates are not seen.  ? ?TOUCH PREPARATIONS: Mixture of cell types present  ? ?CLOT AND BIOPSY: The sections show 60 to 70% cellularity with a mixture  ?of myeloid cell types but with prominent increase in megakaryocytes with  ?clustering.  Many of the megakaryocytes display abnormal morphology.  ?Occasional small interstitial and focally para-trabecular lymphoid  ?aggregates composed of small lymphoid cells are seen.  ? ?IRON STAIN: Iron stains are performed on a bone marrow aspirate or touch  ?imprint smear and section of clot. The controls stained appropriately.  ? ?     Storage Iron: Present, scanty  ?     Ring Sideroblasts: Absent  ? ?# # EGD- [Dr.Ganum; [place]]. On zantac.   ? ?# Left hip replacement ?------------------------------------------------  ? ?DIAGNOSIS: _0  ? ?STAGE:         ;GOALS: ? ?CURRENT/MOST RECENT THERAPY _1  ? ?  ?  ?Essential thrombocythemia ([place])  ? ? ? ?INTERVAL HISTORY: Alone.  Ambulating dependently. ? ?A very pleasant 66 year-old male patient with above history of essential thrombocytosis currently on low-dose Hydrea; and also iron deficiency anemia is here for follow-up/review the results of the bone marrow biopsy. ? ?Patient continues to complain of fatigue.  Patient is taking gentle iron once a day. ? ?Patient interim has been evaluated by GI awaiting endoscopies next week.  Patient continues to have intermittent  rectal bleeding. ? ?He continues to complain of ongoing hip pain postsurgery.  Awaiting a second opinion. ? ? ?Review of Systems   ?Constitutional:  Positive for malaise/fatigue. Negative for chills, diaphoresis, fever and weight loss.  ?HENT:  Negative for nosebleeds and sore throat.   ?Eyes:  Negative for double vision.  ?Respiratory:  Negative for cough, hemoptysis, sputum production, shortness of breath and wheezing.   ?Cardiovascular:  Negative for chest pain, palpitations, orthopnea and leg swelling.  ?Gastrointestinal:  Negative for abdominal pain, blood in stool, constipation, diarrhea, heartburn, melena, nausea and vomiting.  ?Genitourinary:  Negative for dysuria, frequency and urgency.  ?Musculoskeletal:  Positive for joint pain. Negative for back pain.  ?Skin: Negative.  Negative for itching and rash.  ?Neurological:  Negative for dizziness, tingling, focal weakness, weakness and headaches.  ?Endo/Heme/Allergies:  Does not bruise/bleed easily.  ?Psychiatric/Behavioral:  Negative for depression. The patient is not nervous/anxious and does not have insomnia.   ?  ? ?PAST MEDICAL HISTORY :  ?Past Medical History:  ?Diagnosis Date  ? Arthritis   ? osteoarthritis -hips,spine,hands and left wrist.  ? Arthritis   ? Complication of anesthesia   ? GERD (gastroesophageal reflux disease)   ? Pneumonia   ? PONV (postoperative nausea and vomiting)   ? Sleep apnea   ? Thrombocytosis   ? Unspecified diseases of blood and blood-forming organs   ? "essential hyperthrombocytosis"  ? ? ?PAST SURGICAL HISTORY :   ?Past Surgical History:  ?Procedure Laterality Date  ? BACK SURGERY  June 2015  ? ROTATOR CUFF REPAIR    ? x3(lt x2), rt. x1  ? TOTAL HIP ARTHROPLASTY Left 02/11/2014  ? Procedure: LEFT TOTAL HIP ARTHROPLASTY ANTERIOR APPROACH;  Surgeon: Gearlean Alf, MD;  Location: [facility];  Service: Orthopedics;  Laterality: Left;  ? TOTAL HIP ARTHROPLASTY Right 03/11/2021  ? Procedure: TOTAL HIP ARTHROPLASTY ANTERIOR APPROACH;  Surgeon: Gaynelle Arabian, MD;  Location: [facility];  Service: Orthopedics;  Laterality: Right;  ? ? ?FAMILY HISTORY :   ?Family History   ?Problem Relation Age of Onset  ? Cancer Maternal Aunt   ? Cancer Paternal Aunt   ? Stroke Maternal Grandfather   ? ? ?SOCIAL HISTORY:   ?Social History  ? ?Tobacco Use  ? Smoking status: Never  ? Smokeless tobacco: Never  ?Vaping Use  ? Vaping Use: Never used  ?Substance Use Topics  ? Alcohol use: Yes  ?  Alcohol/week: 2.0 standard drinks  ?  Types: 2 Cans of beer per week  ?  Comment: beer/ wine 2glasses  per night  ? Drug use: No  ? ? ?ALLERGIES:  is allergic to hornet venom, wasp venom protein, vicodin [hydrocodone-acetaminophen], other, and penicillins. ? ?MEDICATIONS:  ?Current Outpatient Medications  ?Medication Sig Dispense Refill  ? acetaminophen (TYLENOL) 500 MG tablet Take 500 mg by mouth every 6 (six) hours as needed.    ? albuterol (VENTOLIN HFA) 108 (90 Base) MCG/ACT inhaler Inhale 2 puffs into the lungs every 6 (six) hours as needed for wheezing or shortness of breath. 8 g 2  ? aspirin EC 81 MG tablet Take 81 mg by mouth daily. Swallow whole.    ? cyclobenzaprine (FLEXERIL) 5 MG tablet Take 2 tablets (10 mg total) by mouth 3 (three) times daily as needed for muscle spasms. 40 tablet 0  ? EPINEPHrine 0.3 mg/0.3 mL IJ SOAJ injection Inject 0.3 mg into the muscle as needed for anaphylaxis.  0  ? Fe Bisgly-Vit C-Vit B12-FA (GENTLE IRON  PO) Take by mouth.    ? fluticasone (FLONASE) 50 MCG/ACT nasal spray Place 1 spray into both nostrils daily.    ? hydroxyurea (HYDREA) 500 MG capsule Take 1 capsule (500 mg total) by mouth daily. TAKE TWO CAPSULES DAILY ON MONDAYS, [place], AND FRIDAYS. TAKE ONLY ONE CAPSULE DAILY ON OTHER DAYS OF THE WEEK (Patient taking differently: Take 500 mg by mouth daily. Patient is taking 1 QD) 90 capsule 3  ? loratadine (CLARITIN) 10 MG tablet Take 10 mg by mouth daily.    ? metaxalone (SKELAXIN) 800 MG tablet Take 800 mg by mouth 3 (three) times daily.    ? sildenafil (REVATIO) 20 MG tablet Take 60 mg by mouth daily as needed (ED).    ? ?No current facility-administered  medications for this visit.  ? ? ?PHYSICAL EXAMINATION: ?ECOG PERFORMANCE STATUS: 0 - Asymptomatic ? ?BP 118/72 (BP Location: Left Arm, Patient Position: Sitting, Cuff Size: Normal)   Pulse 77   Temp 98.5 ?F (36.9

## 2021-12-28 NOTE — Progress Notes (Signed)
Pt states he took Tylenol and Benadryl at home prior to coming to infusion ? ?

## 2021-12-28 NOTE — Assessment & Plan Note (Addendum)
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 Hydrea 500 mg one a day-platelets improved at  279.  Continue Hydrea 500 mg a day; low clinical suspicion for any acute process.  April 2023- Bone marrow Bx- s/o MPN; NEGATIVE for any fibrosis; or acute leukemia process. ? ?# Iron deficient anemia- ? etiology [OCT 2022- ferritin-6; iron sat 3%]-s/p IV iron infusion [pre-meds- Tylenol/benadryl]-hemoglobin today is  9.8  Proceed with Venofer.  On Gentle iron q day.  ? ?# ETIOLOGY: The etiology is unclear-unlikely from hip surgery given the surgery was almost 9 months ago.  Again hemorrhoidal bleeding-which is improved/should typically not cause to have iron deficiency.  Awaiting endoscopies next month.  If not improved patient may need capsule study. ? ?# Left Hip pain: awaiting second opinion with Ortho 5/30.  ? ?# DISPOSITION: ?# venofer today; ?#  3 week- venofer  ?# follow up in 6 weeks MD; labs- cbc/bmp; iron studies/ferritin--venofer-Dr.B ?

## 2021-12-28 NOTE — Patient Instructions (Signed)
[facility] [place]  Discharge Instructions: ?Thank you for choosing [facility] to provide your oncology and hematology care.  ?If you have a lab appointment with the [facility] [facility], please go directly to the [facility] and check in at the registration area. ? ?Wear comfortable clothing and clothing appropriate for easy access to any Portacath or PICC line.  ? ?We strive to give you quality time with your provider. You may need to reschedule your appointment if you arrive late (15 or more minutes).  Arriving late affects you and other patients whose appointments are after yours.  Also, if you miss three or more appointments without notifying the office, you may be dismissed from the clinic at the provider?s discretion.    ?  ?For prescription refill requests, have your pharmacy contact our office and allow 72 hours for refills to be completed.   ? ?Today you received the following chemotherapy and/or immunotherapy agents VENOFER    ?  ?To help prevent nausea and vomiting after your treatment, we encourage you to take your nausea medication as directed. ? ?BELOW ARE SYMPTOMS THAT SHOULD BE REPORTED IMMEDIATELY: ?*FEVER GREATER THAN 100.4 F (38 ?C) OR HIGHER ?*CHILLS OR SWEATING ?*NAUSEA AND VOMITING THAT IS NOT CONTROLLED WITH YOUR NAUSEA MEDICATION ?*UNUSUAL SHORTNESS OF BREATH ?*UNUSUAL BRUISING OR BLEEDING ?*URINARY PROBLEMS (pain or burning when urinating, or frequent urination) ?*BOWEL PROBLEMS (unusual diarrhea, constipation, pain near the anus) ?TENDERNESS IN MOUTH AND THROAT WITH OR WITHOUT PRESENCE OF ULCERS (sore throat, sores in mouth, or a toothache) ?UNUSUAL RASH, SWELLING OR PAIN  ?UNUSUAL VAGINAL DISCHARGE OR ITCHING  ? ?Items with * indicate a potential emergency and should be followed up as soon as possible or go to the Emergency Department if any problems should occur. ? ?Please show the CHEMOTHERAPY ALERT CARD or IMMUNOTHERAPY ALERT CARD at check-in to the  Emergency Department and triage nurse. ? ?Should you have questions after your visit or need to cancel or reschedule your appointment, please contact [facility] [place] [facility] [place]  [phone] and follow the prompts.  Office hours are 8:00 a.m. to 4:30 p.m. Monday - Friday. Please note that voicemails left after 4:00 p.m. may not be returned until the following business day.  We are closed weekends and major holidays. You have access to a nurse at all times for urgent questions. Please call the main number to the clinic [phone] and follow the prompts. ? ?For any non-urgent questions, you may also contact your provider using MyChart. We now offer e-Visits for anyone 66 and older to request care online for non-urgent symptoms. For details visit [email or web address]. ?  ?Also download the MyChart app! Go to the app store, search "MyChart", open the app, select [place], and log in with your MyChart username and password. ? ?Due to Covid, a mask is required upon entering the hospital/clinic. If you do not have a mask, one will be given to you upon arrival. For doctor visits, patients may have 1 support person aged 66 or older with them. For treatment visits, patients cannot have anyone with them due to current Covid guidelines and our immunocompromised population.  ? ? ?

## 2022-01-12 DIAGNOSIS — M7070 Other bursitis of hip, unspecified hip: Secondary | ICD-10-CM | POA: Insufficient documentation

## 2022-01-18 ENCOUNTER — Encounter: Payer: Self-pay | Admitting: Internal Medicine

## 2022-01-18 ENCOUNTER — Inpatient Hospital Stay: Payer: BC Managed Care – PPO | Attending: Internal Medicine

## 2022-01-18 VITALS — BP 106/71 | HR 57 | Temp 97.0°F | Resp 18

## 2022-01-18 DIAGNOSIS — D509 Iron deficiency anemia, unspecified: Secondary | ICD-10-CM | POA: Diagnosis present

## 2022-01-18 DIAGNOSIS — D473 Essential (hemorrhagic) thrombocythemia: Secondary | ICD-10-CM | POA: Diagnosis present

## 2022-01-18 DIAGNOSIS — D649 Anemia, unspecified: Secondary | ICD-10-CM

## 2022-01-18 DIAGNOSIS — Z79899 Other long term (current) drug therapy: Secondary | ICD-10-CM | POA: Diagnosis not present

## 2022-01-18 DIAGNOSIS — R5383 Other fatigue: Secondary | ICD-10-CM | POA: Diagnosis not present

## 2022-01-18 DIAGNOSIS — Z885 Allergy status to narcotic agent status: Secondary | ICD-10-CM | POA: Insufficient documentation

## 2022-01-18 DIAGNOSIS — Z809 Family history of malignant neoplasm, unspecified: Secondary | ICD-10-CM | POA: Diagnosis not present

## 2022-01-18 DIAGNOSIS — Z823 Family history of stroke: Secondary | ICD-10-CM | POA: Diagnosis not present

## 2022-01-18 DIAGNOSIS — M2550 Pain in unspecified joint: Secondary | ICD-10-CM | POA: Diagnosis not present

## 2022-01-18 DIAGNOSIS — M25552 Pain in left hip: Secondary | ICD-10-CM | POA: Insufficient documentation

## 2022-01-18 DIAGNOSIS — D75839 Thrombocytosis, unspecified: Secondary | ICD-10-CM | POA: Insufficient documentation

## 2022-01-18 DIAGNOSIS — Z880 Allergy status to penicillin: Secondary | ICD-10-CM | POA: Insufficient documentation

## 2022-01-18 DIAGNOSIS — Z7289 Other problems related to lifestyle: Secondary | ICD-10-CM | POA: Insufficient documentation

## 2022-01-18 MED ORDER — ACETAMINOPHEN 325 MG PO TABS: 650.0000 mg | ORAL_TABLET | Freq: Once | ORAL | Status: DC

## 2022-01-18 MED ORDER — DIPHENHYDRAMINE HCL 25 MG PO CAPS: 50.0000 mg | ORAL_CAPSULE | Freq: Once | ORAL | Status: DC

## 2022-01-18 MED ORDER — SODIUM CHLORIDE 0.9 % IV SOLN: 200.0000 mg | Freq: Once | INTRAVENOUS | Status: DC

## 2022-01-18 MED ORDER — IRON SUCROSE 20 MG/ML IV SOLN
200.0000 mg | Freq: Once | INTRAVENOUS | Status: AC
  Administered 2022-01-18: 200 mg via INTRAVENOUS
  Filled 2022-01-18: qty 10

## 2022-01-18 MED ORDER — SODIUM CHLORIDE 0.9 % IV SOLN
Freq: Once | INTRAVENOUS | Status: AC
  Filled 2022-01-18: qty 250

## 2022-01-18 NOTE — Patient Instructions (Signed)

## 2022-02-08 ENCOUNTER — Inpatient Hospital Stay (HOSPITAL_BASED_OUTPATIENT_CLINIC_OR_DEPARTMENT_OTHER): Payer: BC Managed Care – PPO | Admitting: Internal Medicine

## 2022-02-08 ENCOUNTER — Inpatient Hospital Stay: Payer: BC Managed Care – PPO

## 2022-02-08 ENCOUNTER — Encounter: Payer: Self-pay | Admitting: Internal Medicine

## 2022-02-08 VITALS — BP 116/68 | HR 64

## 2022-02-08 DIAGNOSIS — D473 Essential (hemorrhagic) thrombocythemia: Secondary | ICD-10-CM

## 2022-02-08 DIAGNOSIS — D649 Anemia, unspecified: Secondary | ICD-10-CM

## 2022-02-08 LAB — CBC WITH DIFFERENTIAL/PLATELET
Abs Immature Granulocytes: 0.02 10*3/uL (ref 0.00–0.07)
Basophils Absolute: 0.1 10*3/uL (ref 0.0–0.1)
Basophils Relative: 2 %
Eosinophils Absolute: 0.1 10*3/uL (ref 0.0–0.5)
Eosinophils Relative: 2 %
HCT: 32.9 % — ABNORMAL LOW (ref 39.0–52.0)
Hemoglobin: 10.4 g/dL — ABNORMAL LOW (ref 13.0–17.0)
Immature Granulocytes: 0 %
Lymphocytes Relative: 25 %
Lymphs Abs: 1.8 10*3/uL (ref 0.7–4.0)
MCH: 29.5 pg (ref 26.0–34.0)
MCHC: 31.6 g/dL (ref 30.0–36.0)
MCV: 93.2 fL (ref 80.0–100.0)
Monocytes Absolute: 0.9 10*3/uL (ref 0.1–1.0)
Monocytes Relative: 13 %
Neutro Abs: 4.1 10*3/uL (ref 1.7–7.7)
Neutrophils Relative %: 58 %
Platelets: 432 10*3/uL — ABNORMAL HIGH (ref 150–400)
RBC: 3.53 MIL/uL — ABNORMAL LOW (ref 4.22–5.81)
RDW: 18.8 % — ABNORMAL HIGH (ref 11.5–15.5)
WBC: 7 10*3/uL (ref 4.0–10.5)
nRBC: 0 % (ref 0.0–0.2)

## 2022-02-08 LAB — BASIC METABOLIC PANEL
Anion gap: 7 (ref 5–15)
BUN: 19 mg/dL (ref 8–23)
CO2: 25 mmol/L (ref 22–32)
Calcium: 8.7 mg/dL — ABNORMAL LOW (ref 8.9–10.3)
Chloride: 105 mmol/L (ref 98–111)
Creatinine, Ser: 0.83 mg/dL (ref 0.61–1.24)
GFR, Estimated: >60 mL/min (ref >60)
Glucose, Bld: 106 mg/dL — ABNORMAL HIGH (ref 70–99)
Potassium: 4 mmol/L (ref 3.5–5.1)
Sodium: 137 mmol/L (ref 135–145)

## 2022-02-08 LAB — IRON AND TIBC
Iron: 13 ug/dL — ABNORMAL LOW (ref 45–182)
Saturation Ratios: 3 % — ABNORMAL LOW (ref 17.9–39.5)
TIBC: 445 ug/dL (ref 250–450)
UIBC: 432 ug/dL

## 2022-02-08 LAB — FERRITIN: Ferritin: 11 ng/mL — ABNORMAL LOW (ref 24–336)

## 2022-02-08 MED ORDER — ACETAMINOPHEN 325 MG PO TABS
650.0000 mg | ORAL_TABLET | Freq: Once | ORAL | Status: AC
  Administered 2022-02-08: 650 mg via ORAL
  Filled 2022-02-08: qty 2

## 2022-02-08 MED ORDER — IRON SUCROSE 20 MG/ML IV SOLN
200.0000 mg | Freq: Once | INTRAVENOUS | Status: AC
  Administered 2022-02-08: 200 mg via INTRAVENOUS
  Filled 2022-02-08: qty 10

## 2022-02-08 MED ORDER — SODIUM CHLORIDE 0.9 % IV SOLN: 200.0000 mg | Freq: Once | INTRAVENOUS | Status: DC

## 2022-02-08 MED ORDER — SODIUM CHLORIDE 0.9 % IV SOLN
Freq: Once | INTRAVENOUS | Status: AC
  Filled 2022-02-08: qty 250

## 2022-02-08 MED ORDER — DIPHENHYDRAMINE HCL 25 MG PO CAPS
50.0000 mg | ORAL_CAPSULE | Freq: Once | ORAL | Status: AC
  Administered 2022-02-08: 50 mg via ORAL
  Filled 2022-02-08: qty 2

## 2022-02-08 NOTE — Assessment & Plan Note (Addendum)
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 Hydrea 500 mg one a day-platelets improved at  279.  Continue Hydrea 500 mg a day; low clinical suspicion for any acute process.  April 2023- Bone marrow Bx- s/o MPN; NEGATIVE for any fibrosis; or acute leukemia process.  # Iron deficient anemia- ? etiology [OCT 2022- ferritin-6; iron sat 3%]-s/p IV iron infusion [pre-meds- Tylenol/benadryl]-hemoglobin today is 10.3;   Proceed with Venofer.  Continue/restart gentle iron q day.   S/p endoscopies June. S/p capsule study- June 22nd.  Discussed with GI-await capsule results.  # ETIOLOGY: The etiology is unclear-unlikely from hip surgery given the surgery was almost 9 months ago.  Again hemorrhoidal bleeding-which is improved/should typically not cause to have iron deficiency.  Await upper GI work-up  # Left Hip pain: s/p second opinion with Ortho-overall stable.  # DISPOSITION: # venofer today; #  Once a month venofer x 3  # follow up in 4 months- MD; labs- cbc/bmp; iron studies/ferritin--venofer-Dr.B

## 2022-02-28 ENCOUNTER — Other Ambulatory Visit: Payer: Self-pay | Admitting: Internal Medicine

## 2022-02-28 DIAGNOSIS — D473 Essential (hemorrhagic) thrombocythemia: Secondary | ICD-10-CM

## 2022-03-01 ENCOUNTER — Encounter: Payer: Self-pay | Admitting: Internal Medicine

## 2022-03-01 NOTE — Telephone Encounter (Signed)
CBC with Differential/Platelet Order: [id number] Status: Final result    Visible to patient: Yes (seen)    Next appt: 03/10/2022 at 03:00 PM in Oncology ([facility])    Dx: Essential thrombocythemia ([place])    0 Result Notes           Component Ref Range & Units 3 wk ago (02/08/22) 2 mo ago (12/28/21) 2 mo ago (12/15/21) 3 mo ago (12/01/21) 7 mo ago (07/20/21) 8 mo ago (06/08/21) 9 mo ago (06/01/21)  WBC 4.0 - 10.5 K/uL 7.0  6.2  6.1  7.1  7.7  9.7  7.7   RBC 4.22 - 5.81 MIL/uL 3.53 Low   3.63 Low   3.94 Low   3.82 Low   4.03 Low   3.58 Low   3.33 Low    Hemoglobin 13.0 - 17.0 g/dL 10.4 Low   9.8 Low   10.0 Low   9.3 Low   11.2 Low   9.2 Low   8.4 Low    HCT 39.0 - 52.0 % 32.9 Low   32.1 Low   33.2 Low   30.8 Low   36.5 Low   31.1 Low   28.4 Low    MCV 80.0 - 100.0 fL 93.2  88.4  84.3  80.6  90.6  86.9  85.3   MCH 26.0 - 34.0 pg 29.5  27.0  25.4 Low   24.3 Low   27.8  25.7 Low   25.2 Low    MCHC 30.0 - 36.0 g/dL 31.6  30.5  30.1  30.2  30.7  29.6 Low   29.6 Low    RDW 11.5 - 15.5 % 18.8 High   23.9 High   22.3 High   16.4 High   20.1 High   19.5 High   16.4 High    Platelets 150 - 400 K/uL 432 High   402 High   793 High   279  350  449 High   923 High Panic  CM   nRBC 0.0 - 0.2 % 0.0  0.0  0.0  0.0  0.0  0.0  0.0   Neutrophils Relative % % 58  58  56  56  64  65  53   Neutro Abs 1.7 - 7.7 K/uL 4.1  3.6  3.4  4.0  4.9  6.3  4.2   Lymphocytes Relative % '25  24  24  25  21  23  21   '$ Lymphs Abs 0.7 - 4.0 K/uL 1.8  1.5  1.5  1.8  1.6  2.2  1.6   Monocytes Relative % '13  12  12  14  11  9  19   '$ Monocytes Absolute 0.1 - 1.0 K/uL 0.9  0.7  0.7  1.0  0.9  0.9  1.4 High    Eosinophils Relative % '2  2  5  3  2  2  4   '$ Eosinophils Absolute 0.0 - 0.5 K/uL 0.1  0.1  0.3  0.2  0.1  0.2  0.3   Basophils Relative % '2  3  3  2  2  1  2   '$ Basophils Absolute 0.0 - 0.1 K/uL 0.1  0.2 High   0.2 High   0.1  0.1  0.1  0.1   Immature Granulocytes % 0  1  0  0  0  0  1   Abs Immature Granulocytes  0.00 - 0.07 K/uL 0.02  0.03 CM  0.02  0.02 CM  0.02 CM  0.03 CM  0.04 CM   Comment: Performed at [facility], [address] [place], [place] [address]  RBC Morphology    HYPOCROMIC     MIXED RBC POPULATION   Smear Review    Normal platelet morphology     PLATELETS APPEAR INCREASED CM   Polychromasia    PRESENT CM       WBC Morphology        UNREMARKABLE   Resulting Agency  [facility] [facility] [place] [facility] [place] [facility] [place] [facility] [place] [facility] [place] [facility] [place] [facility]         Specimen Collected: 02/08/22 12:56 Last Resulted: 02/08/22 13:19      Lab Flowsheet    Order Details    View Encounter    Lab and Collection Details    Routing    Result History    View All Conversations on this Encounter      CM=Additional comments      Result Care Coordination   Patient Communication   Add Comments   Seen Back to Top       Other Results from 02/08/2022   Contains abnormal data Ferritin Order: [id number] Status: Final result    Visible to patient: Yes (seen)    Next appt: 03/10/2022 at 03:00 PM in Oncology ([facility] 13)    Dx: Essential thrombocythemia ([place])    0 Result Notes           Component Ref Range & Units 3 wk ago 3 mo ago 7 mo ago 8 mo ago 9 mo ago 9 yr ago 13 yr ago  Ferritin 24 - 336 ng/mL 11 Low   4 Low  CM  16 Low  CM  59 CM  6 Low  CM  31 R  41 R   Comment: Performed at [facility], [address] [place], [place] [address]  Resulting Agency  [place] [facility] [place] [facility] [place] [facility] [place] [facility] [place] [facility]         Specimen Collected: 02/08/22 12:56 Last Resulted: 02/08/22 14:11      Lab Flowsheet    Order Details    View Encounter    Lab and Collection Details    Routing    Result History    View All Conversations on this Encounter      CM=Additional comments  R=Reference range differs from displayed range      Result Care Coordination   Patient Communication   Add Comments   Seen Back to Top           Contains abnormal data Iron and TIBC Order: [id number] Status: Final result    Visible to patient: Yes (seen)    Next appt: 03/10/2022 at 03:00 PM in Oncology ([facility] 13)    Dx: Essential thrombocythemia ([place])    0 Result Notes           Component Ref Range & Units 3 wk ago 3 mo ago 7 mo ago 8 mo ago 9 mo ago 8 yr ago 13 yr ago  Iron 45 - 182 ug/dL 13 Low   16 Low   22 Low   36 Low   13 Low   109 R  144 R   TIBC 250 - 450 ug/dL 445  510 High   420  475 High   448  329 R   Saturation Ratios 17.9 - 39.5 % 3 Low   3 Low   5 Low   8 Low   3 Low      UIBC ug/dL 432  494 CM  398 CM  439 CM  435 CM   185   Comment: Performed at [facility], [address] [place], [place] [address]  Resulting Agency  [place] [facility] [place] [facility] [place] [facility] [place] [facility] [place] [facility] CONVERSION [facility]         Specimen Collected: 02/08/22 12:56 Last Resulted: 02/08/22 14:11      Lab Flowsheet    Order Details    View Encounter    Lab and Collection Details    Routing    Result History    View All Conversations on this Encounter      CM=Additional comments  R=Reference range differs from displayed range      Result Care Coordination   Patient Communication   Add Comments   Seen Back to Top          Contains abnormal data Basic metabolic panel Order: [id number] Status: Final result    Visible to patient: Yes (seen)    Next appt: 03/10/2022 at 03:00 PM in Oncology ([facility] 13)    Dx: Essential thrombocythemia ([place])    0 Result Notes           Component Ref Range & Units 3 wk ago (02/08/22) 2 mo ago (12/28/21) 3 mo ago (12/01/21) 8 mo ago (06/08/21) 9 mo ago (05/12/21) 11 mo ago (03/13/21) 11 mo ago (03/12/21)  Sodium 135 - 145 mmol/L 137  135  134 Low   133 Low   135  132 Low   133 Low    Potassium 3.5 - 5.1 mmol/L 4.0  4.2  3.9  3.9  3.9  3.6  4.0   Chloride 98 - 111 mmol/L 105  104  104  100  101  100  103   CO2 22 - 32 mmol/L '25  26  24  26   25  26  25   '$ Glucose, Bld 70 - 99 mg/dL 106 High   104 High  CM  102 High  CM  100 High  CM  101 High  CM  125 High  CM  129 High  CM   Comment: Glucose reference range applies only to samples taken after fasting for at least 8 hours.  BUN 8 - 23 mg/dL '19  20  21  22  19  12  13   '$ Creatinine, Ser 0.61 - 1.24 mg/dL 0.83  0.92  0.82  0.78  0.92  0.58 Low   0.66   Calcium 8.9 - 10.3 mg/dL 8.7 Low   8.9  8.9  9.0  9.1  8.4 Low   8.1 Low    GFR, Estimated >60 mL/min >60  >60 CM  >60 CM  >60 CM  >60 CM  >60 CM  >60 CM   Comment: (NOTE)  Calculated using the CKD-EPI Creatinine Equation (2021)   Anion gap 5 - '15 7  5 '$ CM  6 CM  7 CM  9 CM  6 CM  5 CM   Comment: Performed at [facility], [address] [place], [place] [address]  Resulting Agency  [place] [facility] [place] [facility] [place] [facility] [place] [facility] [place] [facility] [place] [facility] [place]  [facility]         Specimen Collected: 02/08/22 12:56 Last Resulted: 02/08/22 13:18

## 2022-03-10 ENCOUNTER — Inpatient Hospital Stay: Payer: BC Managed Care – PPO | Attending: Internal Medicine

## 2022-03-10 VITALS — BP 108/60 | HR 70 | Temp 96.0°F

## 2022-03-10 DIAGNOSIS — M2550 Pain in unspecified joint: Secondary | ICD-10-CM | POA: Diagnosis not present

## 2022-03-10 DIAGNOSIS — Z809 Family history of malignant neoplasm, unspecified: Secondary | ICD-10-CM | POA: Diagnosis not present

## 2022-03-10 DIAGNOSIS — D509 Iron deficiency anemia, unspecified: Secondary | ICD-10-CM | POA: Diagnosis present

## 2022-03-10 DIAGNOSIS — Z7289 Other problems related to lifestyle: Secondary | ICD-10-CM | POA: Diagnosis not present

## 2022-03-10 DIAGNOSIS — D473 Essential (hemorrhagic) thrombocythemia: Secondary | ICD-10-CM | POA: Insufficient documentation

## 2022-03-10 DIAGNOSIS — Z823 Family history of stroke: Secondary | ICD-10-CM | POA: Diagnosis not present

## 2022-03-10 DIAGNOSIS — R5383 Other fatigue: Secondary | ICD-10-CM | POA: Insufficient documentation

## 2022-03-10 DIAGNOSIS — Z79899 Other long term (current) drug therapy: Secondary | ICD-10-CM | POA: Insufficient documentation

## 2022-03-10 DIAGNOSIS — D649 Anemia, unspecified: Secondary | ICD-10-CM

## 2022-03-10 MED ORDER — SODIUM CHLORIDE 0.9 % IV SOLN
Freq: Once | INTRAVENOUS | Status: AC
  Filled 2022-03-10: qty 250

## 2022-03-10 MED ORDER — ACETAMINOPHEN 325 MG PO TABS
650.0000 mg | ORAL_TABLET | Freq: Once | ORAL | Status: AC
  Administered 2022-03-10: 325 mg via ORAL
  Filled 2022-03-10: qty 2

## 2022-03-10 MED ORDER — DIPHENHYDRAMINE HCL 25 MG PO CAPS
50.0000 mg | ORAL_CAPSULE | Freq: Once | ORAL | Status: AC
  Administered 2022-03-10: 25 mg via ORAL
  Filled 2022-03-10: qty 2

## 2022-03-10 MED ORDER — SODIUM CHLORIDE 0.9 % IV SOLN
200.0000 mg | Freq: Once | INTRAVENOUS | Status: AC
  Administered 2022-03-10: 200 mg via INTRAVENOUS
  Filled 2022-03-10: qty 200

## 2022-03-10 NOTE — Patient Instructions (Signed)

## 2022-04-09 ENCOUNTER — Inpatient Hospital Stay: Payer: BC Managed Care – PPO | Attending: Internal Medicine

## 2022-04-09 VITALS — BP 102/71 | HR 63 | Temp 96.9°F | Resp 14

## 2022-04-09 DIAGNOSIS — D509 Iron deficiency anemia, unspecified: Secondary | ICD-10-CM | POA: Diagnosis present

## 2022-04-09 DIAGNOSIS — D649 Anemia, unspecified: Secondary | ICD-10-CM

## 2022-04-09 DIAGNOSIS — D473 Essential (hemorrhagic) thrombocythemia: Secondary | ICD-10-CM | POA: Insufficient documentation

## 2022-04-09 DIAGNOSIS — Z79899 Other long term (current) drug therapy: Secondary | ICD-10-CM | POA: Insufficient documentation

## 2022-04-09 MED ORDER — SODIUM CHLORIDE 0.9 % IV SOLN
Freq: Once | INTRAVENOUS | Status: AC
  Filled 2022-04-09: qty 250

## 2022-04-09 MED ORDER — SODIUM CHLORIDE 0.9 % IV SOLN
200.0000 mg | Freq: Once | INTRAVENOUS | Status: AC
  Administered 2022-04-09: 200 mg via INTRAVENOUS
  Filled 2022-04-09: qty 200

## 2022-04-09 NOTE — Patient Instructions (Signed)

## 2022-04-09 NOTE — Progress Notes (Signed)
Patient reports taking tylenol at home around 1415 and refuses benadryl today.

## 2022-05-11 ENCOUNTER — Inpatient Hospital Stay: Payer: BC Managed Care – PPO | Attending: Internal Medicine

## 2022-05-11 VITALS — BP 119/71 | HR 74 | Temp 96.8°F | Resp 18

## 2022-05-11 DIAGNOSIS — Z79899 Other long term (current) drug therapy: Secondary | ICD-10-CM | POA: Insufficient documentation

## 2022-05-11 DIAGNOSIS — D509 Iron deficiency anemia, unspecified: Secondary | ICD-10-CM | POA: Insufficient documentation

## 2022-05-11 DIAGNOSIS — D649 Anemia, unspecified: Secondary | ICD-10-CM

## 2022-05-11 DIAGNOSIS — D473 Essential (hemorrhagic) thrombocythemia: Secondary | ICD-10-CM | POA: Insufficient documentation

## 2022-05-11 MED ORDER — SODIUM CHLORIDE 0.9 % IV SOLN
Freq: Once | INTRAVENOUS | Status: AC
  Filled 2022-05-11: qty 250

## 2022-05-11 MED ORDER — ACETAMINOPHEN 325 MG PO TABS
650.0000 mg | ORAL_TABLET | Freq: Once | ORAL | Status: AC
  Administered 2022-05-11: 650 mg via ORAL
  Filled 2022-05-11: qty 2

## 2022-05-11 MED ORDER — SODIUM CHLORIDE 0.9 % IV SOLN
200.0000 mg | Freq: Once | INTRAVENOUS | Status: AC
  Administered 2022-05-11: 200 mg via INTRAVENOUS
  Filled 2022-05-11: qty 200

## 2022-05-11 NOTE — Patient Instructions (Signed)
[facility]  Discharge Instructions: Thank you for choosing [facility] to provide your oncology and hematology care.  If you have a lab appointment with the Cancer Center, please go directly to the Cancer Center and check in at the registration area.  Wear comfortable clothing and clothing appropriate for easy access to any Portacath or PICC line.   We strive to give you quality time with your provider. You may need to reschedule your appointment if you arrive late (15 or more minutes).  Arriving late affects you and other patients whose appointments are after yours.  Also, if you miss three or more appointments without notifying the office, you may be dismissed from the clinic at the provider's discretion.      For prescription refill requests, have your pharmacy contact our office and allow 72 hours for refills to be completed.    Today you received the following chemotherapy and/or immunotherapy agents Venofer.      To help prevent nausea and vomiting after your treatment, we encourage you to take your nausea medication as directed.  BELOW ARE SYMPTOMS THAT SHOULD BE REPORTED IMMEDIATELY: *FEVER GREATER THAN 100.4 F (38 C) OR HIGHER *CHILLS OR SWEATING *NAUSEA AND VOMITING THAT IS NOT CONTROLLED WITH YOUR NAUSEA MEDICATION *UNUSUAL SHORTNESS OF BREATH *UNUSUAL BRUISING OR BLEEDING *URINARY PROBLEMS (pain or burning when urinating, or frequent urination) *BOWEL PROBLEMS (unusual diarrhea, constipation, pain near the anus) TENDERNESS IN MOUTH AND THROAT WITH OR WITHOUT PRESENCE OF ULCERS (sore throat, sores in mouth, or a toothache) UNUSUAL RASH, SWELLING OR PAIN  UNUSUAL VAGINAL DISCHARGE OR ITCHING   Items with * indicate a potential emergency and should be followed up as soon as possible or go to the Emergency Department if any problems should occur.  Please show the CHEMOTHERAPY ALERT CARD or IMMUNOTHERAPY ALERT CARD at check-in to  the Emergency Department and triage nurse.  Should you have questions after your visit or need to cancel or reschedule your appointment, please contact [facility]  [phone] and follow the prompts.  Office hours are 8:00 a.m. to 4:30 p.m. Monday - Friday. Please note that voicemails left after 4:00 p.m. may not be returned until the following business day.  We are closed weekends and major holidays. You have access to a nurse at all times for urgent questions. Please call the main number to the clinic [phone] and follow the prompts.  For any non-urgent questions, you may also contact your provider using MyChart. We now offer e-Visits for anyone 18 and older to request care online for non-urgent symptoms. For details visit [email or web address].   Also download the MyChart app! Go to the app store, search "MyChart", open the app, select [facility], and log in with your MyChart username and password.  Masks are optional in the cancer centers. If you would like for your care team to wear a mask while they are taking care of you, please let them know. For doctor visits, patients may have with them one support person who is at least 66 years old. At this time, visitors are not allowed in the infusion area.   

## 2022-05-12 ENCOUNTER — Other Ambulatory Visit (HOSPITAL_COMMUNITY): Payer: Self-pay | Admitting: Orthopedic Surgery

## 2022-05-12 DIAGNOSIS — Z96641 Presence of right artificial hip joint: Secondary | ICD-10-CM

## 2022-06-04 ENCOUNTER — Ambulatory Visit: Payer: BC Managed Care – PPO

## 2022-06-09 ENCOUNTER — Inpatient Hospital Stay: Payer: BC Managed Care – PPO

## 2022-06-09 ENCOUNTER — Encounter: Payer: Self-pay | Admitting: Internal Medicine

## 2022-06-09 ENCOUNTER — Inpatient Hospital Stay (HOSPITAL_BASED_OUTPATIENT_CLINIC_OR_DEPARTMENT_OTHER): Payer: BC Managed Care – PPO | Admitting: Internal Medicine

## 2022-06-09 ENCOUNTER — Inpatient Hospital Stay: Payer: BC Managed Care – PPO | Attending: Internal Medicine

## 2022-06-09 DIAGNOSIS — R5383 Other fatigue: Secondary | ICD-10-CM | POA: Diagnosis not present

## 2022-06-09 DIAGNOSIS — D649 Anemia, unspecified: Secondary | ICD-10-CM

## 2022-06-09 DIAGNOSIS — M25552 Pain in left hip: Secondary | ICD-10-CM | POA: Insufficient documentation

## 2022-06-09 DIAGNOSIS — Z880 Allergy status to penicillin: Secondary | ICD-10-CM | POA: Diagnosis not present

## 2022-06-09 DIAGNOSIS — Z809 Family history of malignant neoplasm, unspecified: Secondary | ICD-10-CM | POA: Insufficient documentation

## 2022-06-09 DIAGNOSIS — K921 Melena: Secondary | ICD-10-CM | POA: Insufficient documentation

## 2022-06-09 DIAGNOSIS — D75839 Thrombocytosis, unspecified: Secondary | ICD-10-CM | POA: Diagnosis not present

## 2022-06-09 DIAGNOSIS — Z823 Family history of stroke: Secondary | ICD-10-CM | POA: Insufficient documentation

## 2022-06-09 DIAGNOSIS — M2550 Pain in unspecified joint: Secondary | ICD-10-CM | POA: Diagnosis not present

## 2022-06-09 DIAGNOSIS — D473 Essential (hemorrhagic) thrombocythemia: Secondary | ICD-10-CM

## 2022-06-09 DIAGNOSIS — Z79899 Other long term (current) drug therapy: Secondary | ICD-10-CM | POA: Diagnosis not present

## 2022-06-09 DIAGNOSIS — D509 Iron deficiency anemia, unspecified: Secondary | ICD-10-CM | POA: Diagnosis present

## 2022-06-09 DIAGNOSIS — Z885 Allergy status to narcotic agent status: Secondary | ICD-10-CM | POA: Insufficient documentation

## 2022-06-09 LAB — CBC WITH DIFFERENTIAL/PLATELET
Abs Immature Granulocytes: 0.04 10*3/uL (ref 0.00–0.07)
Basophils Absolute: 0.1 10*3/uL (ref 0.0–0.1)
Basophils Relative: 2 %
Eosinophils Absolute: 0.2 10*3/uL (ref 0.0–0.5)
Eosinophils Relative: 2 %
HCT: 41.2 % (ref 39.0–52.0)
Hemoglobin: 13.7 g/dL (ref 13.0–17.0)
Immature Granulocytes: 1 %
Lymphocytes Relative: 23 %
Lymphs Abs: 1.7 10*3/uL (ref 0.7–4.0)
MCH: 33.5 pg (ref 26.0–34.0)
MCHC: 33.3 g/dL (ref 30.0–36.0)
MCV: 100.7 fL — ABNORMAL HIGH (ref 80.0–100.0)
Monocytes Absolute: 1 10*3/uL (ref 0.1–1.0)
Monocytes Relative: 13 %
Neutro Abs: 4.4 10*3/uL (ref 1.7–7.7)
Neutrophils Relative %: 59 %
Platelets: 602 10*3/uL — ABNORMAL HIGH (ref 150–400)
RBC: 4.09 MIL/uL — ABNORMAL LOW (ref 4.22–5.81)
RDW: 14.2 % (ref 11.5–15.5)
WBC: 7.4 10*3/uL (ref 4.0–10.5)
nRBC: 0 % (ref 0.0–0.2)

## 2022-06-09 LAB — IRON AND TIBC
Iron: 39 ug/dL — ABNORMAL LOW (ref 45–182)
Saturation Ratios: 10 % — ABNORMAL LOW (ref 17.9–39.5)
TIBC: 406 ug/dL (ref 250–450)
UIBC: 367 ug/dL

## 2022-06-09 LAB — BASIC METABOLIC PANEL
Anion gap: 6 (ref 5–15)
BUN: 18 mg/dL (ref 8–23)
CO2: 26 mmol/L (ref 22–32)
Calcium: 8.9 mg/dL (ref 8.9–10.3)
Chloride: 104 mmol/L (ref 98–111)
Creatinine, Ser: 0.88 mg/dL (ref 0.61–1.24)
GFR, Estimated: >60 mL/min (ref >60)
Glucose, Bld: 101 mg/dL — ABNORMAL HIGH (ref 70–99)
Potassium: 4 mmol/L (ref 3.5–5.1)
Sodium: 136 mmol/L (ref 135–145)

## 2022-06-09 LAB — FERRITIN: Ferritin: 14 ng/mL — ABNORMAL LOW (ref 24–336)

## 2022-06-09 MED ORDER — SODIUM CHLORIDE 0.9 % IV SOLN
200.0000 mg | Freq: Once | INTRAVENOUS | Status: AC
  Administered 2022-06-09: 200 mg via INTRAVENOUS
  Filled 2022-06-09: qty 200

## 2022-06-09 MED ORDER — SODIUM CHLORIDE 0.9 % IV SOLN
Freq: Once | INTRAVENOUS | Status: AC
  Filled 2022-06-09: qty 250

## 2022-06-09 MED ORDER — ACETAMINOPHEN 325 MG PO TABS
650.0000 mg | ORAL_TABLET | Freq: Once | ORAL | Status: AC
  Administered 2022-06-09: 325 mg via ORAL
  Filled 2022-06-09: qty 2

## 2022-06-09 MED ORDER — DIPHENHYDRAMINE HCL 25 MG PO CAPS: 50.0000 mg | ORAL_CAPSULE | Freq: Once | ORAL | Status: DC

## 2022-06-09 NOTE — Progress Notes (Signed)
Pt in for follow up, reports having increased energy levels for first few weeks after infusion and then notice levels go down greatly.  Pt also states he continues to have blood in stools.  Reports not as much as before but still concerned over blood loss.

## 2022-06-09 NOTE — Progress Notes (Signed)
[facility] OFFICE PROGRESS NOTE  Patient Care Team: Derinda Late, MD as PCP - General (Family Medicine) Hulan Fess, MD (Family Medicine) Cammie Sickle, MD as Consulting Physician (Oncology)   SUMMARY OF ONCOLOGIC HISTORY:  Oncology History Overview Note  # 2011- ESSENTIAL THROMBOCYTOSIS [Jak-2 pos; incidental]  Summer 2015- Start hydrea;Asprin 81 mg/d; NOV 2016- START hydrea 500/day ]; Fall 2020-Monday Wednesday Friday-1000 milligrams; other days 500 milligrams  April 2023 [worsening Iron def anemia/thrombocytosis]- BONE MARROW, ASPIRATE, CLOT, CORE:  -Hypercellular bone marrow with atypical megakaryocytic proliferation  -Slight plasmacytosis (6%)  -See comment   PERIPHERAL BLOOD:  -Normocytic-normochromic anemia  -Thrombocytosis   COMMENT:   The bone marrow is hypercellular for age with prominent increase in  megakaryocytes associated with many abnormal forms.  Significant  dyspoiesis in the erythroid or granulocytic cell lines is not seen and  no increase in blastic cells identified.  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  There is also slight plasmacytosis with lack of large  aggregates or sheets.  Special stains to assess plasma cell clonality  and degree of fibrosis will be performed and the results reported in an  addendum.  Correlation with cytogenetic studies is recommended.   MICROSCOPIC DESCRIPTION:    PERIPHERAL BLOOD SMEAR: The red blood cells display mild to moderate  anisopoikilocytosis with microcytic cells, elliptocytes, teardrop cells,  target cells.  There is moderate polychromasia.  The white blood cells  are normal in number with scattered hypogranular neutrophils.  The  platelets are increased in number.   BONE  MARROW ASPIRATE: Bone marrow particles present  Erythroid precursors: Orderly and progressive maturation  Granulocytic precursors: Orderly and progressive maturation for the most  part.  No increase in blastic cells identified  Megakaryocytes: Increased in number with many large, atypical forms, and  small and/or hypolobated cells.  Lymphocytes/plasma cells: The plasma cells are slightly increased in  number representing 6% of all cells with lack of large aggregates or  sheets.  Large lymphoid aggregates are not seen.   TOUCH PREPARATIONS: Mixture of cell types present   CLOT AND BIOPSY: The sections show 60 to 70% cellularity with a mixture  of myeloid cell types but with prominent increase in megakaryocytes with  clustering.  Many of the megakaryocytes display abnormal morphology.  Occasional small interstitial and focally para-trabecular lymphoid  aggregates composed of small lymphoid cells are seen.   IRON STAIN: Iron stains are performed on a bone marrow aspirate or touch  imprint smear and section of clot. The controls stained appropriately.        Storage Iron: Present, scanty       Ring Sideroblasts: Absent   # # EGD- [Dr.Ganum; [place]]. On zantac.    # Left hip replacement ------------------------------------------------   Tuesday Jan 05, 2022      Essential thrombocythemia [facility])     INTERVAL HISTORY: Alone.  Ambulating dependently.  A very pleasant 66 year-old male patient with above history of essential thrombocytosis currently on low-dose Hydrea; and also iron deficiency anemia is here for follow-up.  Pt in for follow up, reports having increased energy levels for first few weeks after infusion and then notice levels go down greatly.  Pt also states he continues to have blood in stools.  Reports not as much as before but still concerned over blood loss. Patient  is currently s/p status post EGD/colonoscopy; also status post-capsule endoscopy.   Patient continues to  have intermittent blood in his stools.     He continues to complain of ongoing hip pain postsurgery.-Status post econd opinion.  Review of Systems  Constitutional:  Positive for malaise/fatigue. Negative for chills, diaphoresis, fever and weight loss.  HENT:  Negative for nosebleeds and sore throat.   Eyes:  Negative for double vision.  Respiratory:  Negative for cough, hemoptysis, sputum production, shortness of breath and wheezing.   Cardiovascular:  Negative for chest pain, palpitations, orthopnea and leg swelling.  Gastrointestinal:  Negative for abdominal pain, blood in stool, constipation, diarrhea, heartburn, melena, nausea and vomiting.  Genitourinary:  Negative for dysuria, frequency and urgency.  Musculoskeletal:  Positive for joint pain. Negative for back pain.  Skin: Negative.  Negative for itching and rash.  Neurological:  Negative for dizziness, tingling, focal weakness, weakness and headaches.  Endo/Heme/Allergies:  Does not bruise/bleed easily.  Psychiatric/Behavioral:  Negative for depression. The patient is not nervous/anxious and does not have insomnia.       PAST MEDICAL HISTORY :  Past Medical History:  Diagnosis Date   Arthritis    osteoarthritis -hips,spine,hands and left wrist.   Arthritis    Complication of anesthesia    GERD (gastroesophageal reflux disease)    Pneumonia    PONV (postoperative nausea and vomiting)    Sleep apnea    Thrombocytosis    Unspecified diseases of blood and blood-forming organs    "essential hyperthrombocytosis"    PAST SURGICAL HISTORY :   Past Surgical History:  Procedure Laterality Date   BACK SURGERY  June 2015   ROTATOR CUFF REPAIR     x3(lt x2), rt. x1   TOTAL HIP ARTHROPLASTY Left 02/11/2014   Procedure: LEFT TOTAL HIP ARTHROPLASTY ANTERIOR APPROACH;  Surgeon: Gearlean Alf, MD;  Location: [facility];  Service: Orthopedics;  Laterality: Left;   TOTAL HIP ARTHROPLASTY Right 03/11/2021   Procedure: TOTAL HIP  ARTHROPLASTY ANTERIOR APPROACH;  Surgeon: Gaynelle Arabian, MD;  Location: [facility];  Service: Orthopedics;  Laterality: Right;    FAMILY HISTORY :   Family History  Problem Relation Age of Onset   Cancer Maternal Aunt    Cancer Paternal Aunt    Stroke Maternal Grandfather     SOCIAL HISTORY:   Social History   Tobacco Use   Smoking status: Never   Smokeless tobacco: Never  Vaping Use   Vaping Use: Never used  Substance Use Topics   Alcohol use: Yes    Alcohol/week: 2.0 standard drinks of alcohol    Types: 2 Cans of beer per week    Comment: beer/ wine 2glasses  per night   Drug use: No    ALLERGIES:  is allergic to hornet venom, wasp venom protein, vicodin [hydrocodone-acetaminophen], other, and penicillins.  MEDICATIONS:  Current Outpatient Medications  Medication Sig Dispense Refill   acetaminophen (TYLENOL) 500 MG tablet Take 500 mg by mouth every 6 (six) hours as needed.     albuterol (VENTOLIN HFA) 108 (90 Base) MCG/ACT inhaler Inhale 2 puffs into the lungs every 6 (six) hours as needed for wheezing or shortness of breath. 8 g 2   aspirin EC 81 MG tablet Take 81 mg by mouth daily. Swallow whole.     cyclobenzaprine (FLEXERIL) 5 MG tablet Take 2 tablets (10 mg total) by mouth 3 (three) times daily as needed for muscle spasms. 40 tablet 0   EPINEPHrine 0.3 mg/0.3 mL IJ  SOAJ injection Inject 0.3 mg into the muscle as needed for anaphylaxis.  0   famotidine (PEPCID) 10 MG tablet Take by mouth.     Fe Bisgly-Vit C-Vit B12-FA (GENTLE IRON PO) Take by mouth.     fluticasone (FLONASE) 50 MCG/ACT nasal spray Place 1 spray into both nostrils daily.     hydroxyurea (HYDREA) 500 MG capsule Take 1 capsule (500 mg total) by mouth daily. Patient is taking 1 QD 90 capsule 1   loratadine (CLARITIN) 10 MG tablet Take 10 mg by mouth daily.     metaxalone (SKELAXIN) 800 MG tablet Take 800 mg by mouth 3 (three) times daily.     sildenafil (REVATIO) 20 MG tablet Take 60 mg by mouth daily as  needed (ED).     No current facility-administered medications for this visit.    PHYSICAL EXAMINATION: ECOG PERFORMANCE STATUS: 0 - Asymptomatic  BP 116/73 (BP Location: Left Arm, Patient Position: Sitting)   Pulse 80   Temp (!) 97 F (36.1 C) (Tympanic)   Resp 16   Wt 180 lb (81.6 kg)   SpO2 95%   BMI 26.58 kg/m   Filed Weights   06/09/22 1520  Weight: 180 lb (81.6 kg)    Physical Exam HENT:     Head: Normocephalic and atraumatic.     Mouth/Throat:     Pharynx: No oropharyngeal exudate.  Eyes:     Pupils: Pupils are equal, round, and reactive to light.  Cardiovascular:     Rate and Rhythm: Normal rate and regular rhythm.  Pulmonary:     Effort: No respiratory distress.     Breath sounds: No wheezing.  Abdominal:     General: Bowel sounds are normal. There is no distension.     Palpations: Abdomen is soft. There is no mass.     Tenderness: There is no abdominal tenderness. There is no guarding or rebound.  Musculoskeletal:        General: No tenderness. Normal range of motion.     Cervical back: Normal range of motion and neck supple.  Skin:    General: Skin is warm.  Neurological:     Mental Status: He is alert and oriented to person, place, and time.  Psychiatric:        Mood and Affect: Affect normal.    LABORATORY DATA:  I have reviewed the data as listed    Component Value Date/Time   NA 136 06/09/2022 1447   K 4.0 06/09/2022 1447   CL 104 06/09/2022 1447   CO2 26 06/09/2022 1447   GLUCOSE 101 (H) 06/09/2022 1447   BUN 18 06/09/2022 1447   CREATININE 0.88 06/09/2022 1447   CREATININE 0.86 02/28/2014 1450   CALCIUM 8.9 06/09/2022 1447   PROT 7.2 05/12/2021 1434   PROT 7.4 02/28/2014 1450   ALBUMIN 3.9 05/12/2021 1434   ALBUMIN 3.1 (L) 02/28/2014 1450   AST 19 05/12/2021 1434   AST 16 02/28/2014 1450   ALT 14 05/12/2021 1434   ALT 40 02/28/2014 1450   ALKPHOS 67 05/12/2021 1434   ALKPHOS 109 02/28/2014 1450   BILITOT 0.7 05/12/2021 1434    BILITOT 0.5 02/28/2014 1450   GFRNONAA >60 06/09/2022 1447   GFRNONAA >60 02/28/2014 1450   GFRAA >60 05/12/2020 1423   GFRAA >60 02/28/2014 1450    No results found for: "SPEP", "UPEP"  Lab Results  Component Value Date   WBC 7.4 06/09/2022   NEUTROABS 4.4 06/09/2022   HGB 13.7 06/09/2022  HCT 41.2 06/09/2022   MCV 100.7 (H) 06/09/2022   PLT 602 (H) 06/09/2022      Chemistry      Component Value Date/Time   NA 136 06/09/2022 1447   K 4.0 06/09/2022 1447   CL 104 06/09/2022 1447   CO2 26 06/09/2022 1447   BUN 18 06/09/2022 1447   CREATININE 0.88 06/09/2022 1447   CREATININE 0.86 02/28/2014 1450      Component Value Date/Time   CALCIUM 8.9 06/09/2022 1447   ALKPHOS 67 05/12/2021 1434   ALKPHOS 109 02/28/2014 1450   AST 19 05/12/2021 1434   AST 16 02/28/2014 1450   ALT 14 05/12/2021 1434   ALT 40 02/28/2014 1450   BILITOT 0.7 05/12/2021 1434   BILITOT 0.5 02/28/2014 1450      ASSESSMENT & PLAN:   Essential thrombocythemia ([place]) # 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 low clinical suspicion for any acute process.  April 2023- Bone marrow Bx- s/o MPN; NEGATIVE for any fibrosis; or acute leukemia process.  Platelets 600.  Monitor for now. Continue Hydrea 500 mg a day;   # Iron deficient anemia-? etiology [OCT 2022- ferritin-6; iron sat 3%]- s/p IV iron infusion [pre-meds- Tylenol/benadryl]-hemoglobin today is 13.  Proceed with Venofer.  Continue/restart gentle iron q day.   # ETIOLOGY: The etiology is unclear-? hemorrhoidal bleeding S/p endoscopies; S/p capsule study- 2023- WNL. .  S/p Dr.Cintron steroids supp.  # Left Hip pain: s/p second opinion with Ortho-overall stable.  # DISPOSITION: # venofer today; # follow up in 6  months- MD; labs- cbc/bmp; iron studies/ferritin--venofer-Dr.B     Cammie Sickle, MD 06/09/2022 11:13 PM

## 2022-06-09 NOTE — Assessment & Plan Note (Addendum)
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 low clinical suspicion for any acute process.  April 2023- Bone marrow Bx- s/o MPN; NEGATIVE for any fibrosis; or acute leukemia process.  Platelets 600.  Monitor for now. Continue Hydrea 500 mg a day;   # Iron deficient anemia-? etiology [OCT 2022- ferritin-6; iron sat 3%]- s/p IV iron infusion [pre-meds- Tylenol/benadryl]-hemoglobin today is 13.  Proceed with Venofer.  Continue/restart gentle iron q day.   # ETIOLOGY: The etiology is unclear-? hemorrhoidal bleeding S/p endoscopies; S/p capsule study- 2023- WNL. .  S/p Dr.Cintron steroids supp.  # Left Hip pain: s/p second opinion with Ortho-overall stable.  # DISPOSITION: # venofer today; # follow up in 6  months- MD; labs- cbc/bmp; iron studies/ferritin--venofer-Dr.B

## 2022-06-28 ENCOUNTER — Ambulatory Visit (INDEPENDENT_AMBULATORY_CARE_PROVIDER_SITE_OTHER): Payer: Self-pay | Admitting: Physician Assistant

## 2022-06-28 ENCOUNTER — Encounter: Payer: Self-pay | Admitting: Physician Assistant

## 2022-06-28 ENCOUNTER — Encounter: Payer: Self-pay | Admitting: Internal Medicine

## 2022-06-28 VITALS — BP 120/70 | HR 90 | Temp 98.0°F | Ht 69.5 in | Wt 179.8 lb

## 2022-06-28 DIAGNOSIS — R051 Acute cough: Secondary | ICD-10-CM

## 2022-06-28 DIAGNOSIS — J069 Acute upper respiratory infection, unspecified: Secondary | ICD-10-CM

## 2022-06-28 MED ORDER — BENZONATATE 200 MG PO CAPS: 200.0000 mg | ORAL_CAPSULE | Freq: Three times a day (TID) | ORAL | 0 refills | Status: DC | PRN

## 2022-06-28 NOTE — Progress Notes (Signed)
[facility] [address], [place] [address]   Office Visit Note  Patient Name: Joshua Cabrera Date of Birth [id number]  Medical Record number [id number]  Date of Service: 06/28/2022  Chief Complaint  Patient presents with   Nasal Congestion    Friday morning he started not feeling right. Congested, coughing all night. Has been doing OTC meds. COVID was neg. this morning. Kept him awake most of the night. Not coughing any mucus up.      66 y/o M presents to the clinic for c/o cough x 3 days. +post-nasal drainage. Denies CP, SOB, wheezing, fever or body aches. Has been taking otc claritin without much relief. No known exposure to covid. Tested negative for covid earlier today.       Current Medication:  Outpatient Encounter Medications as of 06/28/2022  Medication Sig   aspirin EC 81 MG tablet Take 81 mg by mouth daily. Swallow whole.   benzonatate (TESSALON) 200 MG capsule Take 1 capsule (200 mg total) by mouth 3 (three) times daily as needed for cough.   famotidine (PEPCID) 10 MG tablet Take by mouth.   Fe Bisgly-Vit C-Vit B12-FA (GENTLE IRON PO) Take by mouth.   fluticasone (FLONASE) 50 MCG/ACT nasal spray Place 1 spray into both nostrils daily.   hydroxyurea (HYDREA) 500 MG capsule Take 1 capsule (500 mg total) by mouth daily. Patient is taking 1 QD   loratadine (CLARITIN) 10 MG tablet Take 10 mg by mouth daily.   metaxalone (SKELAXIN) 800 MG tablet Take 800 mg by mouth 3 (three) times daily.   acetaminophen (TYLENOL) 500 MG tablet Take 500 mg by mouth every 6 (six) hours as needed. (Patient not taking: Reported on 06/28/2022)   albuterol (VENTOLIN HFA) 108 (90 Base) MCG/ACT inhaler Inhale 2 puffs into the lungs every 6 (six) hours as needed for wheezing or shortness of breath. (Patient not taking: Reported on 06/28/2022)   cyclobenzaprine (FLEXERIL) 5 MG tablet Take 2 tablets (10 mg total) by mouth 3 (three) times daily as needed for muscle spasms. (Patient not  taking: Reported on 06/28/2022)   EPINEPHrine 0.3 mg/0.3 mL IJ SOAJ injection Inject 0.3 mg into the muscle as needed for anaphylaxis.   sildenafil (REVATIO) 20 MG tablet Take 60 mg by mouth daily as needed (ED).   No facility-administered encounter medications on file as of 06/28/2022.      Medical History: Past Medical History:  Diagnosis Date   Arthritis    osteoarthritis -hips,spine,hands and left wrist.   Arthritis    Complication of anesthesia    GERD (gastroesophageal reflux disease)    Pneumonia    PONV (postoperative nausea and vomiting)    Sleep apnea    Thrombocytosis    Unspecified diseases of blood and blood-forming organs    "essential hyperthrombocytosis"     Vital Signs: BP 120/70 (BP Location: Left Arm, Patient Position: Sitting, Cuff Size: Normal)   Pulse 90   Temp 98 F (36.7 C) (Tympanic)   Ht 5' 9.5" (1.765 m)   Wt 179 lb 12.8 oz (81.6 kg)   SpO2 98%   BMI 26.17 kg/m    Review of Systems  Constitutional: Negative.   HENT:  Positive for postnasal drip. Negative for congestion, rhinorrhea, sinus pressure, sinus pain, sneezing and trouble swallowing.   Respiratory:  Positive for cough. Negative for chest tightness, shortness of breath and wheezing.   Cardiovascular: Negative.   Neurological: Negative.     Physical Exam Constitutional:  Appearance: Normal appearance.  HENT:     Head: Atraumatic.     Right Ear: Tympanic membrane, ear canal and external ear normal.     Left Ear: Tympanic membrane, ear canal and external ear normal.     Nose: Nose normal.     Mouth/Throat:     Mouth: Mucous membranes are moist.     Pharynx: Oropharynx is clear.  Eyes:     Extraocular Movements: Extraocular movements intact.  Cardiovascular:     Rate and Rhythm: Normal rate and regular rhythm.  Pulmonary:     Effort: Pulmonary effort is normal.     Breath sounds: Normal breath sounds. No wheezing, rhonchi or rales.     Comments: Intermittent coughing  during examination Musculoskeletal:     Cervical back: Neck supple.  Skin:    General: Skin is warm.  Neurological:     Mental Status: He is alert.  Psychiatric:        Mood and Affect: Mood normal.        Behavior: Behavior normal.        Thought Content: Thought content normal.        Judgment: Judgment normal.       Assessment/Plan:  1. Viral upper respiratory tract infection  2. Acute cough - benzonatate (TESSALON) 200 MG capsule; Take 1 capsule (200 mg total) by mouth 3 (three) times daily as needed for cough.  Dispense: 30 capsule; Refill: 0  Increase fluids Continue with humidifier Continue with otc oral anti-histamine Take otc oral decongestant Take medicine as prescribed Watch for worsening symptoms RTC if symptoms don't improve or worsen. Briefly discussed oral steroids or antibiotic if symptoms worsen.  Pt verbalized understanding and in agreement.    General Counseling: dudley mages understanding of the findings of todays visit and agrees with plan of treatment. I have discussed any further diagnostic evaluation that may be needed or ordered today. We also reviewed his medications today. he has been encouraged to call the office with any questions or concerns that should arise related to todays visit.    Time spent:[address] [place], [place] Physician Assistant

## 2022-08-17 IMAGING — CT CT BIOPSY AND ASPIRATION BONE MARROW
1 of 3 series · 9 of 14 positions shown, 12 images · non-contrast
Comparison: none

INDICATION: Anemia

[Series 2: i-spiral 5.0 br38 · axial · 0.82mm/px · z∈[+1096,+1222]mm · 9 of 46 slices shown, 12 images]
[im 5/46  soft-tissue]
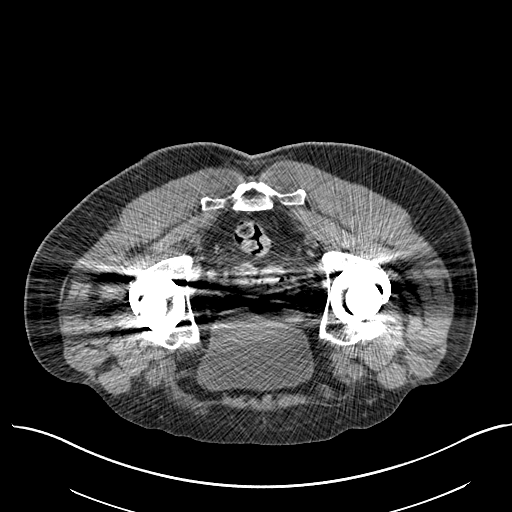
[im 5/46  bone]
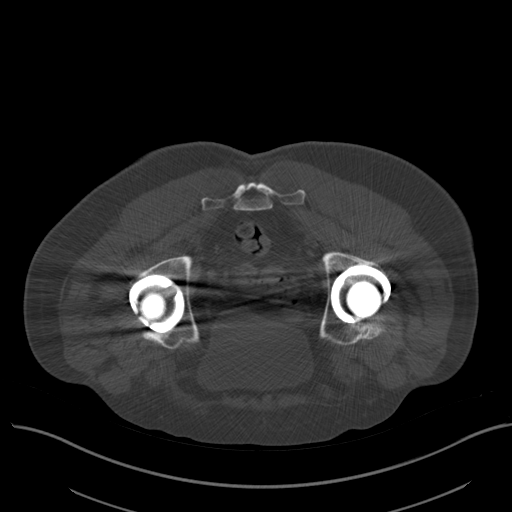
[im 10/46  bone]
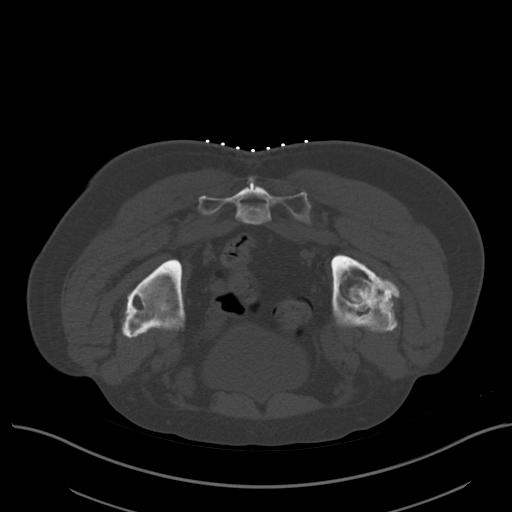
[im 14/46  bone]
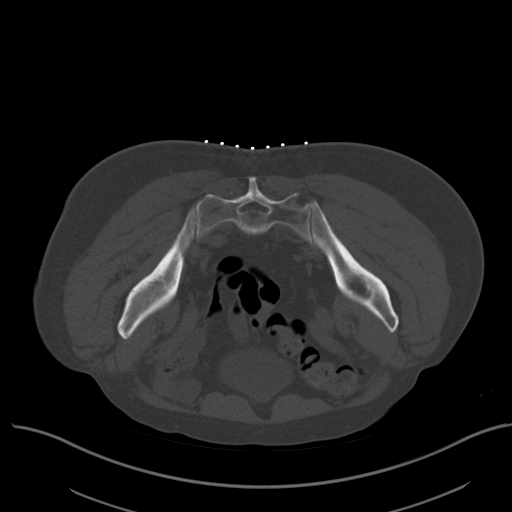
[im 19/46  bone]
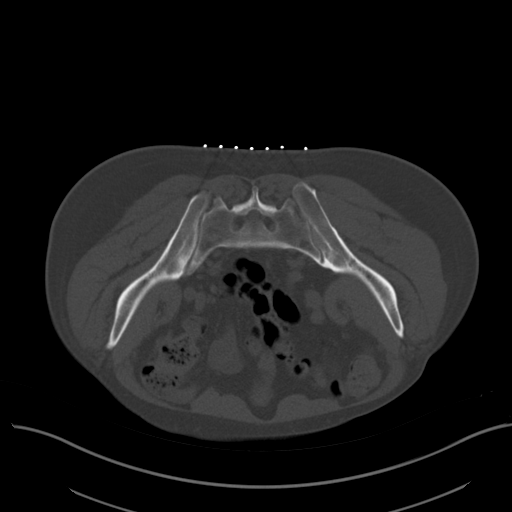
[im 23/46  soft-tissue]
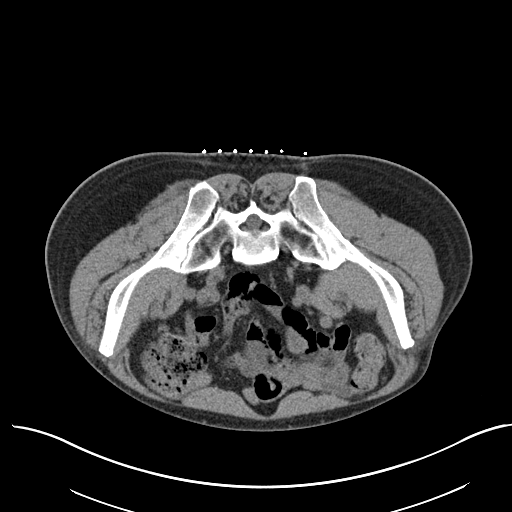
[im 23/46  bone]
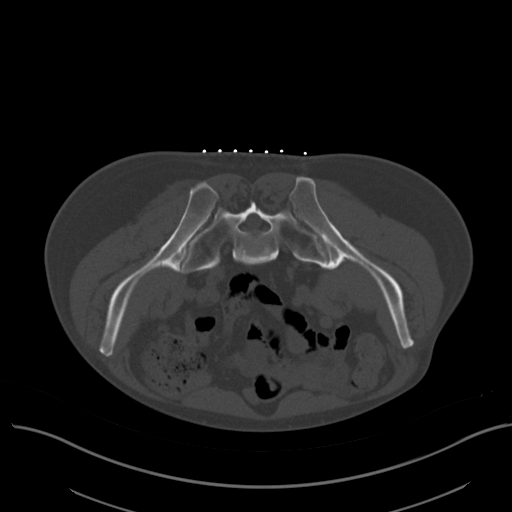
[im 28/46  bone]
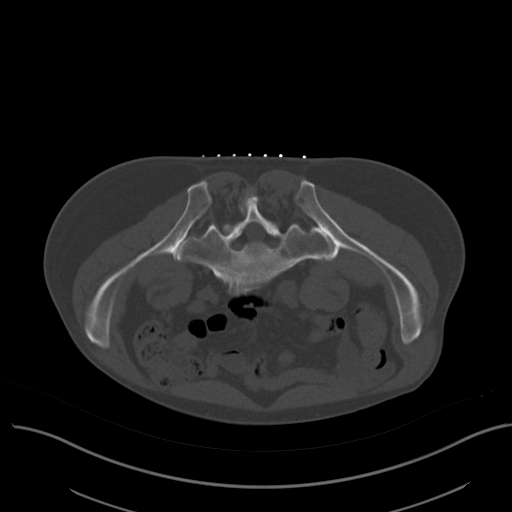
[im 32/46  bone]
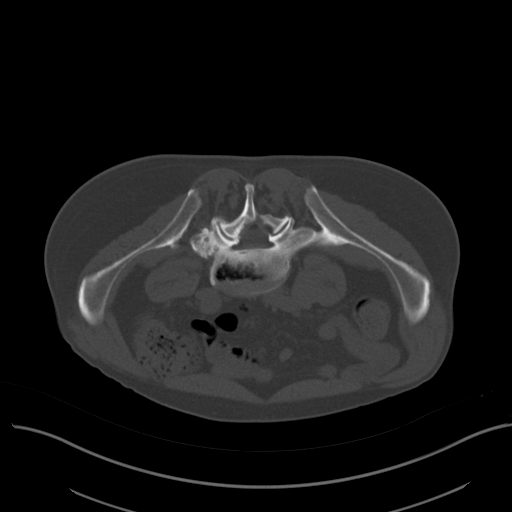
[im 37/46  bone]
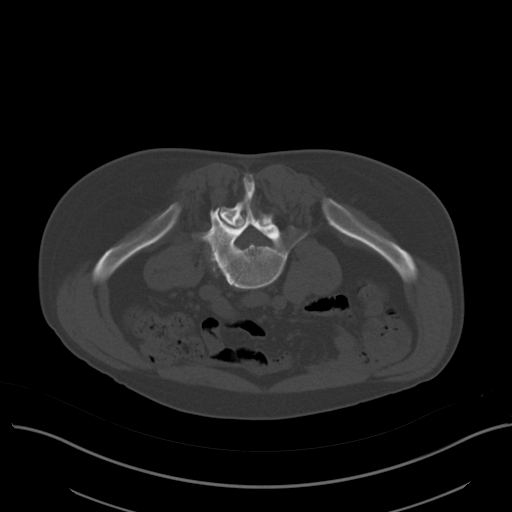
[im 41/46  soft-tissue]
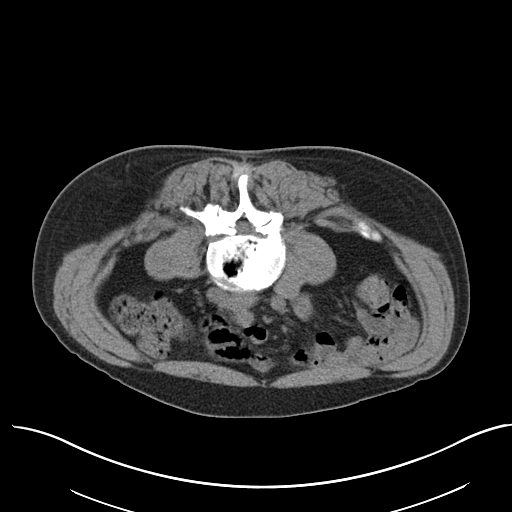
[im 41/46  bone]
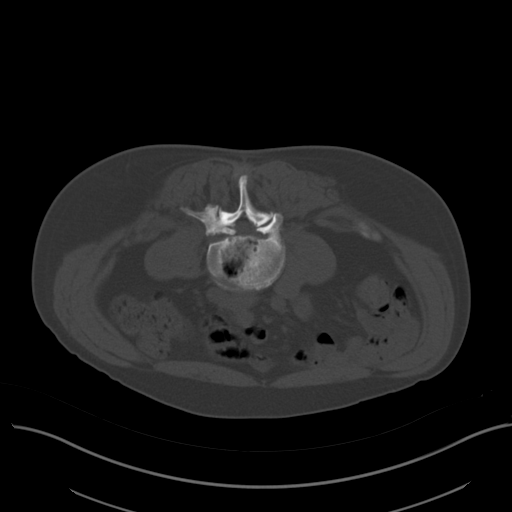

[9 of 14 positions shown; findings below may reference images not displayed]

EXAM:
CT BONE MARROW BIOPSY AND ASPIRATION

MEDICATIONS:
None.

ANESTHESIA/SEDATION:
Moderate (conscious) sedation was employed during this procedure. A
total of Versed 1.5 mg and Fentanyl 75 mcg was administered
intravenously.

Moderate Sedation Time: 10 minutes. The patient's level of
consciousness and vital signs were monitored continuously by
radiology nursing throughout the procedure under my direct
supervision.

FLUOROSCOPY TIME:  N/a

COMPLICATIONS:
None immediate.

PROCEDURE:
Informed written consent was obtained from the patient after a
thorough discussion of the procedural risks, benefits and
alternatives. All questions were addressed. Maximal Sterile Barrier
Technique was utilized including caps, mask, sterile gowns, sterile
gloves, sterile drape, hand hygiene and skin antiseptic. A timeout
was performed prior to the initiation of the procedure.

The patient was placed prone on the CT exam table. Limited CT of the
pelvis was performed for planning purposes. Skin entry site was
marked, and the overlying skin was prepped and draped in the
standard sterile fashion. Local analgesia was obtained with 1%
lidocaine. Using CT guidance, an 11 gauge needle was advanced just
deep to the cortex of the left posterior ilium. Subsequently, bone
marrow aspiration and core biopsy were performed. Specimens were
submitted to lab/pathology for handling. Hemostasis was achieved
with manual pressure, and a clean dressing was placed. The patient
tolerated the procedure well without immediate complication.
IMPRESSION: Successful CT-guided bone marrow aspiration and core biopsy of the
left posterior ilium.

## 2022-09-13 ENCOUNTER — Other Ambulatory Visit: Payer: Self-pay | Admitting: Internal Medicine

## 2022-09-13 DIAGNOSIS — D473 Essential (hemorrhagic) thrombocythemia: Secondary | ICD-10-CM

## 2022-09-16 ENCOUNTER — Encounter: Payer: Self-pay | Admitting: Internal Medicine

## 2022-09-23 ENCOUNTER — Inpatient Hospital Stay: Payer: BC Managed Care – PPO | Attending: Internal Medicine

## 2022-09-23 ENCOUNTER — Encounter: Payer: Self-pay | Admitting: Nurse Practitioner

## 2022-09-23 ENCOUNTER — Inpatient Hospital Stay (HOSPITAL_BASED_OUTPATIENT_CLINIC_OR_DEPARTMENT_OTHER): Payer: BC Managed Care – PPO | Admitting: Nurse Practitioner

## 2022-09-23 ENCOUNTER — Inpatient Hospital Stay: Payer: BC Managed Care – PPO

## 2022-09-23 VITALS — BP 129/84 | HR 80 | Temp 97.8°F | Ht 69.5 in | Wt 188.0 lb

## 2022-09-23 VITALS — BP 119/78 | HR 76

## 2022-09-23 DIAGNOSIS — Z880 Allergy status to penicillin: Secondary | ICD-10-CM | POA: Diagnosis not present

## 2022-09-23 DIAGNOSIS — Z79899 Other long term (current) drug therapy: Secondary | ICD-10-CM | POA: Insufficient documentation

## 2022-09-23 DIAGNOSIS — Z823 Family history of stroke: Secondary | ICD-10-CM | POA: Diagnosis not present

## 2022-09-23 DIAGNOSIS — R0602 Shortness of breath: Secondary | ICD-10-CM | POA: Insufficient documentation

## 2022-09-23 DIAGNOSIS — D649 Anemia, unspecified: Secondary | ICD-10-CM

## 2022-09-23 DIAGNOSIS — R5383 Other fatigue: Secondary | ICD-10-CM | POA: Insufficient documentation

## 2022-09-23 DIAGNOSIS — M2550 Pain in unspecified joint: Secondary | ICD-10-CM | POA: Insufficient documentation

## 2022-09-23 DIAGNOSIS — D473 Essential (hemorrhagic) thrombocythemia: Secondary | ICD-10-CM | POA: Diagnosis not present

## 2022-09-23 DIAGNOSIS — D509 Iron deficiency anemia, unspecified: Secondary | ICD-10-CM | POA: Diagnosis present

## 2022-09-23 DIAGNOSIS — K921 Melena: Secondary | ICD-10-CM | POA: Insufficient documentation

## 2022-09-23 DIAGNOSIS — E611 Iron deficiency: Secondary | ICD-10-CM | POA: Diagnosis not present

## 2022-09-23 DIAGNOSIS — Z809 Family history of malignant neoplasm, unspecified: Secondary | ICD-10-CM | POA: Diagnosis not present

## 2022-09-23 DIAGNOSIS — D75839 Thrombocytosis, unspecified: Secondary | ICD-10-CM | POA: Diagnosis present

## 2022-09-23 DIAGNOSIS — M25552 Pain in left hip: Secondary | ICD-10-CM | POA: Diagnosis not present

## 2022-09-23 DIAGNOSIS — Z885 Allergy status to narcotic agent status: Secondary | ICD-10-CM | POA: Insufficient documentation

## 2022-09-23 DIAGNOSIS — Z7289 Other problems related to lifestyle: Secondary | ICD-10-CM | POA: Insufficient documentation

## 2022-09-23 LAB — BASIC METABOLIC PANEL
Anion gap: 9 (ref 5–15)
BUN: 18 mg/dL (ref 8–23)
CO2: 25 mmol/L (ref 22–32)
Calcium: 8.8 mg/dL — ABNORMAL LOW (ref 8.9–10.3)
Chloride: 102 mmol/L (ref 98–111)
Creatinine, Ser: 0.84 mg/dL (ref 0.61–1.24)
GFR, Estimated: >60 mL/min (ref >60)
Glucose, Bld: 108 mg/dL — ABNORMAL HIGH (ref 70–99)
Potassium: 4.1 mmol/L (ref 3.5–5.1)
Sodium: 136 mmol/L (ref 135–145)

## 2022-09-23 LAB — CBC WITH DIFFERENTIAL/PLATELET
Abs Immature Granulocytes: 0.02 10*3/uL (ref 0.00–0.07)
Basophils Absolute: 0.1 10*3/uL (ref 0.0–0.1)
Basophils Relative: 1 %
Eosinophils Absolute: 0.2 10*3/uL (ref 0.0–0.5)
Eosinophils Relative: 2 %
HCT: 42.7 % (ref 39.0–52.0)
Hemoglobin: 14.2 g/dL (ref 13.0–17.0)
Immature Granulocytes: 0 %
Lymphocytes Relative: 20 %
Lymphs Abs: 1.7 10*3/uL (ref 0.7–4.0)
MCH: 33.5 pg (ref 26.0–34.0)
MCHC: 33.3 g/dL (ref 30.0–36.0)
MCV: 100.7 fL — ABNORMAL HIGH (ref 80.0–100.0)
Monocytes Absolute: 1 10*3/uL (ref 0.1–1.0)
Monocytes Relative: 12 %
Neutro Abs: 5.4 10*3/uL (ref 1.7–7.7)
Neutrophils Relative %: 65 %
Platelets: 571 10*3/uL — ABNORMAL HIGH (ref 150–400)
RBC: 4.24 MIL/uL (ref 4.22–5.81)
RDW: 14.1 % (ref 11.5–15.5)
WBC: 8.4 10*3/uL (ref 4.0–10.5)
nRBC: 0 % (ref 0.0–0.2)

## 2022-09-23 LAB — IRON AND TIBC
Iron: 42 ug/dL — ABNORMAL LOW (ref 45–182)
Saturation Ratios: 10 % — ABNORMAL LOW (ref 17.9–39.5)
TIBC: 410 ug/dL (ref 250–450)
UIBC: 368 ug/dL

## 2022-09-23 LAB — FERRITIN: Ferritin: 12 ng/mL — ABNORMAL LOW (ref 24–336)

## 2022-09-23 MED ORDER — DIPHENHYDRAMINE HCL 25 MG PO CAPS: 50.0000 mg | ORAL_CAPSULE | Freq: Once | ORAL | Status: DC

## 2022-09-23 MED ORDER — SODIUM CHLORIDE 0.9 % IV SOLN
Freq: Once | INTRAVENOUS | Status: AC
  Filled 2022-09-23: qty 250

## 2022-09-23 MED ORDER — ACETAMINOPHEN 325 MG PO TABS
650.0000 mg | ORAL_TABLET | Freq: Once | ORAL | Status: AC
  Administered 2022-09-23: 325 mg via ORAL

## 2022-09-23 MED ORDER — SODIUM CHLORIDE 0.9 % IV SOLN
200.0000 mg | Freq: Once | INTRAVENOUS | Status: AC
  Administered 2022-09-23: 200 mg via INTRAVENOUS
  Filled 2022-09-23: qty 200

## 2022-09-23 NOTE — Progress Notes (Signed)
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  Significant  dyspoiesis in the erythroid or granulocytic cell lines is not seen and  no increase in blastic cells identified.  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  There is also slight plasmacytosis with lack of large  aggregates or sheets.  Special stains to assess plasma cell clonality  and degree of fibrosis will be performed and the results reported in an  addendum.  Correlation with cytogenetic studies is recommended.   MICROSCOPIC DESCRIPTION:    PERIPHERAL BLOOD SMEAR: The red blood cells display mild to moderate  anisopoikilocytosis with microcytic cells, elliptocytes, teardrop cells,  target cells.  There is moderate polychromasia.  The white blood cells  are normal in number with scattered hypogranular neutrophils.  The  platelets are increased in number.   BONE MARROW  ASPIRATE: Bone marrow particles present  Erythroid precursors: Orderly and progressive maturation  Granulocytic precursors: Orderly and progressive maturation for the most  part.  No increase in blastic cells identified  Megakaryocytes: Increased in number with many large, atypical forms, and  small and/or hypolobated cells.  Lymphocytes/plasma cells: The plasma cells are slightly increased in  number representing 6% of all cells with lack of large aggregates or  sheets.  Large lymphoid aggregates are not seen.   TOUCH PREPARATIONS: Mixture of cell types present   CLOT AND BIOPSY: The sections show 60 to 70% cellularity with a mixture  of myeloid cell types but with prominent increase in megakaryocytes with  clustering.  Many of the megakaryocytes display abnormal morphology.  Occasional small interstitial and focally para-trabecular lymphoid  aggregates composed of small lymphoid cells are seen.   IRON STAIN: Iron stains are performed on a bone marrow aspirate or touch  imprint smear and section of clot. The controls stained appropriately.        Storage Iron: Present, scanty       Ring Sideroblasts: Absent   # # EGD- [Dr.Ganum; [place]]. On zantac.    # Left hip replacement ------------------------------------------------   Tuesday Jan 05, 2022      Essential thrombocythemia [facility])     INTERVAL HISTORY: Alone.  Ambulating independently. Unaccompanied.   Patient is very pleasant 67 year old male, with history of ET, currently on low dose hydrea and iron deficiency who returns to clinic for evaluation of exertional shortness of breath. Symptoms similar to when he has low iron levels previously. He continues to have hip pain and has had second opinion with recommendation for possible revision. He's undecided and may seek third opinion. He continues to have blood in his stools. Has used  steroidal suppositories but symptoms come and go. Has had oriented prescription EGD, colonoscopy,  capsule study.  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   Review of Systems  Constitutional:  Positive for malaise/fatigue. Negative for chills, diaphoresis, fever and weight loss.  HENT:  Negative for nosebleeds and sore throat.   Eyes:  Negative for double vision.  Respiratory:  Negative for cough, hemoptysis, sputum production, shortness of breath and wheezing.   Cardiovascular:  Negative for chest pain, palpitations, orthopnea and leg swelling.  Gastrointestinal:  Negative for abdominal pain, blood in stool, constipation, diarrhea, heartburn, melena, nausea and vomiting.  Genitourinary:  Negative for dysuria, frequency and urgency.  Musculoskeletal:  Positive for joint pain. Negative for back pain.  Skin: Negative.  Negative for itching and rash.  Neurological:  Negative for dizziness, tingling, focal weakness, weakness and headaches.  Endo/Heme/Allergies:  Does not bruise/bleed easily.  Psychiatric/Behavioral:  Negative for depression. The patient is not nervous/anxious and does not have insomnia.    PAST MEDICAL HISTORY :  Past Medical History:  Diagnosis Date   Arthritis    osteoarthritis -hips,spine,hands and left wrist.   Arthritis    Complication of anesthesia    GERD (gastroesophageal reflux disease)    Pneumonia    PONV (postoperative nausea and vomiting)    Sleep apnea    Thrombocytosis    Unspecified diseases of blood and blood-forming organs    "essential hyperthrombocytosis"    PAST SURGICAL HISTORY :   Past Surgical History:  Procedure Laterality Date   BACK SURGERY  June 2015   ROTATOR CUFF REPAIR     x3(lt x2), rt. x1   TOTAL HIP ARTHROPLASTY Left 02/11/2014   Procedure: LEFT TOTAL HIP ARTHROPLASTY ANTERIOR APPROACH;  Surgeon: Gearlean Alf, MD;  Location: [facility];  Service: Orthopedics;  Laterality: Left;   TOTAL HIP ARTHROPLASTY Right 03/11/2021   Procedure: TOTAL HIP ARTHROPLASTY ANTERIOR APPROACH;  Surgeon: Gaynelle Arabian, MD;   Location: [facility];  Service: Orthopedics;  Laterality: Right;    FAMILY HISTORY :   Family History  Problem Relation Age of Onset   Cancer Maternal Aunt    Cancer Paternal Aunt    Stroke Maternal Grandfather     SOCIAL HISTORY:   Social History   Tobacco Use   Smoking status: Never   Smokeless tobacco: Never  Vaping Use   Vaping Use: Never used  Substance Use Topics   Alcohol use: Yes    Alcohol/week: 2.0 standard drinks of alcohol    Types: 2 Cans of beer per week    Comment: beer/ wine 2glasses  per night   Drug use: No    ALLERGIES:  is allergic to hornet venom, wasp venom protein, vicodin [hydrocodone-acetaminophen], other, and penicillins.  MEDICATIONS:  Current Outpatient Medications  Medication Sig Dispense Refill   aspirin EC 81 MG tablet Take 81 mg by mouth daily. Swallow whole.     EPINEPHrine 0.3 mg/0.3 mL IJ SOAJ injection Inject 0.3 mg into the muscle as needed for anaphylaxis.  0   famotidine (PEPCID) 10 MG tablet Take by mouth.     Fe Bisgly-Vit C-Vit B12-FA (GENTLE IRON PO) Take by mouth.     fluticasone (FLONASE) 50 MCG/ACT nasal spray Place 1 spray into both nostrils daily.     hydroxyurea (HYDREA) 500 MG capsule TAKE 1 CAPSULE (500 MG TOTAL) BY MOUTH DAILY 30 capsule 1   loratadine (CLARITIN) 10 MG tablet Take 10 mg by mouth daily.  metaxalone (SKELAXIN) 800 MG tablet Take 800 mg by mouth 3 (three) times daily.     sildenafil (REVATIO) 20 MG tablet Take 60 mg by mouth daily as needed (ED).     acetaminophen (TYLENOL) 500 MG tablet Take 500 mg by mouth every 6 (six) hours as needed. (Patient not taking: Reported on 06/28/2022)     albuterol (VENTOLIN HFA) 108 (90 Base) MCG/ACT inhaler Inhale 2 puffs into the lungs every 6 (six) hours as needed for wheezing or shortness of breath. (Patient not taking: Reported on 06/28/2022) 8 g 2   benzonatate (TESSALON) 200 MG capsule Take 1 capsule (200 mg total) by mouth 3 (three) times daily as needed for cough. 30  capsule 0   cyclobenzaprine (FLEXERIL) 5 MG tablet Take 2 tablets (10 mg total) by mouth 3 (three) times daily as needed for muscle spasms. (Patient not taking: Reported on 06/28/2022) 40 tablet 0   No current facility-administered medications for this visit.    PHYSICAL EXAMINATION: ECOG PERFORMANCE STATUS: 0 - Asymptomatic  BP 129/84 (BP Location: Right Arm, Patient Position: Sitting)   Pulse 80   Temp 97.8 F (36.6 C) (Tympanic)   Ht 5' 9.5" (1.765 m)   Wt 188 lb (85.3 kg)   SpO2 96%   BMI 27.36 kg/m   Filed Weights   09/23/22 1439  Weight: 188 lb (85.3 kg)    Physical Exam Constitutional:      Appearance: He is not ill-appearing.  Cardiovascular:     Rate and Rhythm: Normal rate and regular rhythm.  Pulmonary:     Effort: No respiratory distress.     Breath sounds: No wheezing.  Abdominal:     General: There is no distension.     Palpations: Abdomen is soft.     Tenderness: There is no abdominal tenderness. There is no guarding.  Musculoskeletal:        General: No tenderness.  Skin:    General: Skin is warm.     Coloration: Skin is not pale.  Neurological:     Mental Status: He is alert and oriented to person, place, and time.  Psychiatric:        Mood and Affect: Mood and affect normal.        Behavior: Behavior normal.    LABORATORY DATA:  I have reviewed the data as listed Lab Results  Component Value Date   WBC 8.4 09/23/2022   NEUTROABS 5.4 09/23/2022   HGB 14.2 09/23/2022   HCT 42.7 09/23/2022   MCV 100.7 (H) 09/23/2022   PLT 571 (H) 09/23/2022      Latest Ref Rng & Units 06/09/2022    2:47 PM 02/08/2022   12:56 PM 12/28/2021    1:25 PM  CMP  Glucose 70 - 99 mg/dL 101  106  104   BUN 8 - 23 mg/dL '18  19  20   '$ Creatinine 0.61 - 1.24 mg/dL 0.88  0.83  0.92   Sodium 135 - 145 mmol/L 136  137  135   Potassium 3.5 - 5.1 mmol/L 4.0  4.0  4.2   Chloride 98 - 111 mmol/L 104  105  104   CO2 22 - 32 mmol/L '26  25  26   '$ Calcium 8.9 - 10.3 mg/dL  8.9  8.7  8.9      ASSESSMENT & PLAN:   No problem-specific Assessment & Plan notes found for this encounter.  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 low clinical suspicion for any  acute process. April 2023- Bone marrow Bx- s/o MPN; NEGATIVE for any fibrosis; or acute leukemia process. Platelets 571.Continue hydrea 500 mg daily.   # Iron deficient anemia- etiology unclear. Possible hemorrhoidal bleeding. He is s/p endoscopy, colonoscopy, and capsule study w/o clear etiology otherwise. October 2022- ferritin 6, iron sat 3%. He is s/p venofer, last 06/09/22. Hemoglobin improved. Ferritin and iron studies pending at time of visit. Oct 23 ferritin was 14. Symptomatic. Proceed with venofer today. Additional doses based on iron studies.     # Left Hip pain: s/p second opinion with Ortho. Unchanged.    # DISPOSITION: # venofer today. Follow up as scheduled- la   Verlon Au, NP 09/23/2022

## 2022-09-23 NOTE — Progress Notes (Signed)
Patient declined his pre-meds except for 1 325 mg. Tylenol PO. Patient declined to wait the 30 minutes after Tylenol prior to starting the iron infusion. Patient also declined to wait the 30 minutes post iron infusion for observation. Patient educated on post iron instructions, including allergic reaction and patient verbalized understanding.

## 2022-11-08 ENCOUNTER — Other Ambulatory Visit: Payer: Self-pay | Admitting: Internal Medicine

## 2022-11-08 DIAGNOSIS — D473 Essential (hemorrhagic) thrombocythemia: Secondary | ICD-10-CM

## 2022-11-22 ENCOUNTER — Encounter: Payer: Self-pay | Admitting: Internal Medicine

## 2022-12-10 ENCOUNTER — Other Ambulatory Visit: Payer: BC Managed Care – PPO

## 2022-12-10 ENCOUNTER — Ambulatory Visit: Payer: BC Managed Care – PPO | Admitting: Internal Medicine

## 2022-12-10 ENCOUNTER — Ambulatory Visit: Payer: BC Managed Care – PPO

## 2022-12-15 ENCOUNTER — Inpatient Hospital Stay: Payer: BC Managed Care – PPO

## 2022-12-15 ENCOUNTER — Other Ambulatory Visit: Payer: Self-pay

## 2022-12-15 ENCOUNTER — Other Ambulatory Visit: Payer: Self-pay | Admitting: Medical Oncology

## 2022-12-15 ENCOUNTER — Inpatient Hospital Stay: Payer: BC Managed Care – PPO | Attending: Internal Medicine

## 2022-12-15 ENCOUNTER — Encounter: Payer: Self-pay | Admitting: Medical Oncology

## 2022-12-15 ENCOUNTER — Inpatient Hospital Stay: Payer: BC Managed Care – PPO | Admitting: Internal Medicine

## 2022-12-15 ENCOUNTER — Inpatient Hospital Stay (HOSPITAL_BASED_OUTPATIENT_CLINIC_OR_DEPARTMENT_OTHER): Payer: BC Managed Care – PPO | Admitting: Medical Oncology

## 2022-12-15 VITALS — BP 119/72 | HR 75 | Temp 97.9°F | Resp 16

## 2022-12-15 VITALS — BP 110/88 | HR 78 | Temp 97.3°F | Wt 184.0 lb

## 2022-12-15 DIAGNOSIS — D649 Anemia, unspecified: Secondary | ICD-10-CM

## 2022-12-15 DIAGNOSIS — D473 Essential (hemorrhagic) thrombocythemia: Secondary | ICD-10-CM

## 2022-12-15 DIAGNOSIS — E611 Iron deficiency: Secondary | ICD-10-CM

## 2022-12-15 DIAGNOSIS — R0602 Shortness of breath: Secondary | ICD-10-CM | POA: Diagnosis not present

## 2022-12-15 DIAGNOSIS — G4730 Sleep apnea, unspecified: Secondary | ICD-10-CM | POA: Diagnosis not present

## 2022-12-15 DIAGNOSIS — D509 Iron deficiency anemia, unspecified: Secondary | ICD-10-CM | POA: Insufficient documentation

## 2022-12-15 DIAGNOSIS — Z880 Allergy status to penicillin: Secondary | ICD-10-CM | POA: Diagnosis not present

## 2022-12-15 DIAGNOSIS — Z823 Family history of stroke: Secondary | ICD-10-CM | POA: Insufficient documentation

## 2022-12-15 DIAGNOSIS — Z809 Family history of malignant neoplasm, unspecified: Secondary | ICD-10-CM | POA: Diagnosis not present

## 2022-12-15 DIAGNOSIS — R5383 Other fatigue: Secondary | ICD-10-CM | POA: Diagnosis not present

## 2022-12-15 DIAGNOSIS — Z79899 Other long term (current) drug therapy: Secondary | ICD-10-CM | POA: Insufficient documentation

## 2022-12-15 DIAGNOSIS — M25552 Pain in left hip: Secondary | ICD-10-CM | POA: Insufficient documentation

## 2022-12-15 DIAGNOSIS — D75839 Thrombocytosis, unspecified: Secondary | ICD-10-CM | POA: Insufficient documentation

## 2022-12-15 DIAGNOSIS — R509 Fever, unspecified: Secondary | ICD-10-CM | POA: Insufficient documentation

## 2022-12-15 DIAGNOSIS — Z885 Allergy status to narcotic agent status: Secondary | ICD-10-CM | POA: Insufficient documentation

## 2022-12-15 DIAGNOSIS — M2550 Pain in unspecified joint: Secondary | ICD-10-CM | POA: Diagnosis not present

## 2022-12-15 LAB — IRON AND TIBC
Iron: 47 ug/dL (ref 45–182)
Saturation Ratios: 10 % — ABNORMAL LOW (ref 17.9–39.5)
TIBC: 452 ug/dL — ABNORMAL HIGH (ref 250–450)
UIBC: 405 ug/dL

## 2022-12-15 LAB — CBC WITH DIFFERENTIAL/PLATELET
Abs Immature Granulocytes: 0.06 10*3/uL (ref 0.00–0.07)
Basophils Absolute: 0.2 10*3/uL — ABNORMAL HIGH (ref 0.0–0.1)
Basophils Relative: 2 %
Eosinophils Absolute: 0.2 10*3/uL (ref 0.0–0.5)
Eosinophils Relative: 3 %
HCT: 41.8 % (ref 39.0–52.0)
Hemoglobin: 13.5 g/dL (ref 13.0–17.0)
Immature Granulocytes: 1 %
Lymphocytes Relative: 20 %
Lymphs Abs: 1.4 10*3/uL (ref 0.7–4.0)
MCH: 32.1 pg (ref 26.0–34.0)
MCHC: 32.3 g/dL (ref 30.0–36.0)
MCV: 99.5 fL (ref 80.0–100.0)
Monocytes Absolute: 0.8 10*3/uL (ref 0.1–1.0)
Monocytes Relative: 11 %
Neutro Abs: 4.6 10*3/uL (ref 1.7–7.7)
Neutrophils Relative %: 63 %
Platelets: 809 10*3/uL — ABNORMAL HIGH (ref 150–400)
RBC: 4.2 MIL/uL — ABNORMAL LOW (ref 4.22–5.81)
RDW: 14.2 % (ref 11.5–15.5)
WBC: 7.2 10*3/uL (ref 4.0–10.5)
nRBC: 0 % (ref 0.0–0.2)

## 2022-12-15 LAB — CMP (CANCER CENTER ONLY)
ALT: 18 U/L (ref 0–44)
AST: 22 U/L (ref 15–41)
Albumin: 3.9 g/dL (ref 3.5–5.0)
Alkaline Phosphatase: 64 U/L (ref 38–126)
Anion gap: 8 (ref 5–15)
BUN: 16 mg/dL (ref 8–23)
CO2: 23 mmol/L (ref 22–32)
Calcium: 8.8 mg/dL — ABNORMAL LOW (ref 8.9–10.3)
Chloride: 101 mmol/L (ref 98–111)
Creatinine: 0.82 mg/dL (ref 0.61–1.24)
GFR, Estimated: >60 mL/min (ref >60)
Glucose, Bld: 100 mg/dL — ABNORMAL HIGH (ref 70–99)
Potassium: 3.8 mmol/L (ref 3.5–5.1)
Sodium: 132 mmol/L — ABNORMAL LOW (ref 135–145)
Total Bilirubin: 1 mg/dL (ref 0.3–1.2)
Total Protein: 7.1 g/dL (ref 6.5–8.1)

## 2022-12-15 LAB — RETIC PANEL
Immature Retic Fract: 15.5 % (ref 2.3–15.9)
RBC.: 4.1 MIL/uL — ABNORMAL LOW (ref 4.22–5.81)
Retic Count, Absolute: 76.7 10*3/uL (ref 19.0–186.0)
Retic Ct Pct: 1.9 % (ref 0.4–3.1)
Reticulocyte Hemoglobin: 35.1 pg (ref >27.9)

## 2022-12-15 LAB — FERRITIN: Ferritin: 23 ng/mL — ABNORMAL LOW (ref 24–336)

## 2022-12-15 LAB — LACTATE DEHYDROGENASE: LDH: 134 U/L (ref 98–192)

## 2022-12-15 MED ORDER — ACETAMINOPHEN 325 MG PO TABS
650.0000 mg | ORAL_TABLET | Freq: Once | ORAL | Status: AC
  Administered 2022-12-15: 650 mg via ORAL
  Filled 2022-12-15: qty 2

## 2022-12-15 MED ORDER — SODIUM CHLORIDE 0.9 % IV SOLN
Freq: Once | INTRAVENOUS | Status: AC
  Filled 2022-12-15: qty 250

## 2022-12-15 MED ORDER — SODIUM CHLORIDE 0.9 % IV SOLN
200.0000 mg | Freq: Once | INTRAVENOUS | Status: AC
  Administered 2022-12-15: 200 mg via INTRAVENOUS
  Filled 2022-12-15: qty 200

## 2022-12-15 NOTE — Patient Instructions (Signed)
I would recommend that you see a Proctologist in regards to your hemorrhoids:  Vincent Peyer and [facility] Proct[facility] is one example

## 2022-12-15 NOTE — Progress Notes (Signed)
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  Significant  dyspoiesis in the erythroid or granulocytic cell lines is not seen and  no increase in blastic cells identified.  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  There is also slight plasmacytosis with lack of large  aggregates or sheets.  Special stains to assess plasma cell clonality  and degree of fibrosis will be performed and the results reported in an  addendum.  Correlation with cytogenetic studies is recommended.   MICROSCOPIC DESCRIPTION:    PERIPHERAL BLOOD SMEAR: The red blood cells display mild to moderate  anisopoikilocytosis with microcytic cells, elliptocytes, teardrop cells,  target cells.  There is moderate polychromasia.  The white blood cells  are normal in number with scattered hypogranular neutrophils.  The  platelets are increased in number.   BONE MARROW  ASPIRATE: Bone marrow particles present  Erythroid precursors: Orderly and progressive maturation  Granulocytic precursors: Orderly and progressive maturation for the most  part.  No increase in blastic cells identified  Megakaryocytes: Increased in number with many large, atypical forms, and  small and/or hypolobated cells.  Lymphocytes/plasma cells: The plasma cells are slightly increased in  number representing 6% of all cells with lack of large aggregates or  sheets.  Large lymphoid aggregates are not seen.   TOUCH PREPARATIONS: Mixture of cell types present   CLOT AND BIOPSY: The sections show 60 to 70% cellularity with a mixture  of myeloid cell types but with prominent increase in megakaryocytes with  clustering.  Many of the megakaryocytes display abnormal morphology.  Occasional small interstitial and focally para-trabecular lymphoid  aggregates composed of small lymphoid cells are seen.   IRON STAIN: Iron stains are performed on a bone marrow aspirate or touch  imprint smear and section of clot. The controls stained appropriately.        Storage Iron: Present, scanty       Ring Sideroblasts: Absent   # # EGD- [Dr.Ganum; [place]]. On zantac.    # Left hip replacement ------------------------------------------------   Tuesday Jan 05, 2022      Essential thrombocythemia [facility])    INTERVAL HISTORY: Alone.  Ambulating independently. Unaccompanied.   Patient is very pleasant 67 year old male, with history of ET, currently on low dose hydrea and iron deficiency who returns to clinic for evaluation of exertional shortness of breath and fatigue. He reports that he continues to take his hydrea as prescribed. He missed one dose last week but normally does well with taking this daily. He is frustrated by his continue iron deficiency. He has had full GI work up including EGD,  colonoscopy, capsule study without significant findings. He denies hemoptysis or epistaxis. He denies hematuria.  He does report frequent and heavy rectal bleeding which is from his internal hemorrhoids. He reports bleeding most days. He has seen a general surgeon to discuss banding but instead was given instructions for increasing his fiber intake.     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   Review of Systems  Constitutional:  Positive for malaise/fatigue. Negative for chills, diaphoresis, fever and weight loss.  HENT:  Negative for nosebleeds and sore throat.   Eyes:  Negative for double vision.  Respiratory:  Negative for cough, hemoptysis, sputum production, shortness of breath and wheezing.   Cardiovascular:  Negative for chest pain, palpitations, orthopnea and leg swelling.  Gastrointestinal:  Negative for abdominal pain, blood in stool, constipation, diarrhea, heartburn, melena, nausea and vomiting.  Genitourinary:  Negative for dysuria, frequency and urgency.  Musculoskeletal:  Positive for joint pain. Negative for back pain.  Skin: Negative.  Negative for itching and rash.  Neurological:  Negative for dizziness, tingling, focal weakness, weakness and headaches.  Endo/Heme/Allergies:  Does not bruise/bleed easily.  Psychiatric/Behavioral:  Negative for depression. The patient is not nervous/anxious and does not have insomnia.    PAST MEDICAL HISTORY :  Past Medical History:  Diagnosis Date   Arthritis    osteoarthritis -hips,spine,hands and left wrist.   Arthritis    Complication of anesthesia    GERD (gastroesophageal reflux disease)    Pneumonia    PONV (postoperative nausea and vomiting)    Sleep apnea    Thrombocytosis    Unspecified diseases of blood and blood-forming organs    "essential hyperthrombocytosis"    PAST SURGICAL HISTORY :   Past Surgical History:  Procedure Laterality Date   BACK SURGERY  June 2015   ROTATOR CUFF REPAIR     x3(lt x2), rt. x1   TOTAL HIP ARTHROPLASTY Left 02/11/2014   Procedure: LEFT TOTAL HIP ARTHROPLASTY ANTERIOR  APPROACH;  Surgeon: Loanne Drilling, MD;  Location: [facility];  Service: Orthopedics;  Laterality: Left;   TOTAL HIP ARTHROPLASTY Right 03/11/2021   Procedure: TOTAL HIP ARTHROPLASTY ANTERIOR APPROACH;  Surgeon: Ollen Gross, MD;  Location: [facility];  Service: Orthopedics;  Laterality: Right;    FAMILY HISTORY :   Family History  Problem Relation Age of Onset   Cancer Maternal Aunt    Cancer Paternal Aunt    Stroke Maternal Grandfather     SOCIAL HISTORY:   Social History   Tobacco Use   Smoking status: Never   Smokeless tobacco: Never  Vaping Use   Vaping Use: Never used  Substance Use Topics   Alcohol use: Yes    Alcohol/week: 2.0 standard drinks of alcohol    Types: 2 Cans of beer per week    Comment: beer/ wine 2glasses  per night   Drug use: No    ALLERGIES:  is allergic to hornet venom, wasp venom protein, vicodin [hydrocodone-acetaminophen], other, and penicillins.  MEDICATIONS:  Current Outpatient Medications  Medication Sig Dispense Refill   aspirin EC 81 MG tablet Take 81 mg by mouth daily. Swallow whole.     EPINEPHrine 0.3 mg/0.3 mL IJ SOAJ injection Inject 0.3 mg into the muscle as needed for anaphylaxis.  0   famotidine (PEPCID) 10 MG tablet Take by mouth.     Fe Bisgly-Vit C-Vit B12-FA (GENTLE IRON PO) Take by mouth.     fluticasone (FLONASE) 50 MCG/ACT nasal spray Place 1  spray into both nostrils daily.     hydroxyurea (HYDREA) 500 MG capsule TAKE 1 CAPSULE (500 MG TOTAL) DAILY 30 capsule 1   loratadine (CLARITIN) 10 MG tablet Take 10 mg by mouth daily.     metaxalone (SKELAXIN) 800 MG tablet Take 800 mg by mouth 3 (three) times daily.     sildenafil (REVATIO) 20 MG tablet Take 60 mg by mouth daily as needed (ED).     acetaminophen (TYLENOL) 500 MG tablet Take 500 mg by mouth every 6 (six) hours as needed. (Patient not taking: Reported on 06/28/2022)     albuterol (VENTOLIN HFA) 108 (90 Base) MCG/ACT inhaler Inhale 2 puffs into the lungs every 6 (six) hours as  needed for wheezing or shortness of breath. (Patient not taking: Reported on 06/28/2022) 8 g 2   cyclobenzaprine (FLEXERIL) 5 MG tablet Take 2 tablets (10 mg total) by mouth 3 (three) times daily as needed for muscle spasms. (Patient not taking: Reported on 06/28/2022) 40 tablet 0   No current facility-administered medications for this visit.    PHYSICAL EXAMINATION: ECOG PERFORMANCE STATUS: 0 - Asymptomatic  BP 110/88 (BP Location: Left Arm, Patient Position: Sitting)   Pulse 78   Temp (!) 97.3 F (36.3 C) (Tympanic)   Wt 184 lb (83.5 kg)   SpO2 98%   BMI 26.78 kg/m   Filed Weights   12/15/22 1528  Weight: 184 lb (83.5 kg)    Physical Exam Constitutional:      Appearance: He is not ill-appearing.  Cardiovascular:     Rate and Rhythm: Normal rate and regular rhythm.  Pulmonary:     Effort: No respiratory distress.     Breath sounds: No wheezing.  Abdominal:     General: There is no distension.     Palpations: Abdomen is soft.     Tenderness: There is no abdominal tenderness. There is no guarding.  Musculoskeletal:        General: No tenderness.  Skin:    General: Skin is warm.     Coloration: Skin is not pale.  Neurological:     Mental Status: He is alert and oriented to person, place, and time.  Psychiatric:        Mood and Affect: Mood and affect normal.        Behavior: Behavior normal.    LABORATORY DATA:  I have reviewed the data as listed Lab Results  Component Value Date   WBC 7.2 12/15/2022   NEUTROABS 4.6 12/15/2022   HGB 13.5 12/15/2022   HCT 41.8 12/15/2022   MCV 99.5 12/15/2022   PLT 809 (H) 12/15/2022      Latest Ref Rng & Units 09/23/2022    2:24 PM 06/09/2022    2:47 PM 02/08/2022   12:56 PM  CMP  Glucose 70 - 99 mg/dL 130  865  784   BUN 8 - 23 mg/dL 18  18  19    Creatinine 0.61 - 1.24 mg/dL 6.96  2.95  2.84   Sodium 135 - 145 mmol/L 136  136  137   Potassium 3.5 - 5.1 mmol/L 4.1  4.0  4.0   Chloride 98 - 111 mmol/L 102  104  105    CO2 22 - 32 mmol/L 25  26  25    Calcium 8.9 - 10.3 mg/dL 8.8  8.9  8.7      ASSESSMENT & PLAN:   No problem-specific Assessment & Plan notes found for this encounter.  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 low clinical suspicion for any acute process. April 2023- Bone marrow Bx- s/o MPN; NEGATIVE for any fibrosis; or acute leukemia process. Today his Platelets have increased from 571 to 809. May be reactive in nature to his iron deficiency. Continue hydrea 500 mg daily for the next 2 weeks. He will return for lab recheck- if still elevated I would suggest dose increase of his hydrea and urinalysis to assess for other sources of bleeding. For now will set him up for weekly venofer x 5 doses total. He will continue to take his baby asa.   # Iron deficient anemia- Likely secondary to his significant hemorrhoidal bleeding.Marland Kitchen He is s/p endoscopy, colonoscopy, and capsule study w/o clear etiology otherwise. I have suggested he see a proctologist as he may benefit from sclerotherapy. He is s/p venofer, last dose was on 09/23/2022. He is tolerating his Venofer well. He reports that he used to take benadryl and tylenol as pre meds however he stopped doing this and has done well without side effects. Initially had one fever after venofer but this has never recurred. He declines premeds but is ok with taking tylenol along with each dose.     # Left Hip pain: s/p second opinion with Ortho. Unchanged.    # DISPOSITION: IV Venofer today- then weekly x 5 weeks total RTC 2 weeks APP, labs (CBC) RTC 1 month APP +- Venofer with labs (CBC, CMP, iron, ferritin, retic, ldh)1-7 days prior-[place]  *updated check out note via scheduling message to reflect 5 weekly doses of Venofer total.    Rushie Chestnut, PA-C 12/15/2022

## 2022-12-15 NOTE — Patient Instructions (Signed)
[facility]  Discharge Instructions: Thank you for choosing [facility] to provide your oncology and hematology care.  If you have a lab appointment with the Cancer Center, please go directly to the Cancer Center and check in at the registration area.  Wear comfortable clothing and clothing appropriate for easy access to any Portacath or PICC line.   We strive to give you quality time with your provider. You may need to reschedule your appointment if you arrive late (15 or more minutes).  Arriving late affects you and other patients whose appointments are after yours.  Also, if you miss three or more appointments without notifying the office, you may be dismissed from the clinic at the provider's discretion.      For prescription refill requests, have your pharmacy contact our office and allow 72 hours for refills to be completed.    Today you received the following chemotherapy and/or immunotherapy agents Venofer.      To help prevent nausea and vomiting after your treatment, we encourage you to take your nausea medication as directed.  BELOW ARE SYMPTOMS THAT SHOULD BE REPORTED IMMEDIATELY: *FEVER GREATER THAN 100.4 F (38 C) OR HIGHER *CHILLS OR SWEATING *NAUSEA AND VOMITING THAT IS NOT CONTROLLED WITH YOUR NAUSEA MEDICATION *UNUSUAL SHORTNESS OF BREATH *UNUSUAL BRUISING OR BLEEDING *URINARY PROBLEMS (pain or burning when urinating, or frequent urination) *BOWEL PROBLEMS (unusual diarrhea, constipation, pain near the anus) TENDERNESS IN MOUTH AND THROAT WITH OR WITHOUT PRESENCE OF ULCERS (sore throat, sores in mouth, or a toothache) UNUSUAL RASH, SWELLING OR PAIN  UNUSUAL VAGINAL DISCHARGE OR ITCHING   Items with * indicate a potential emergency and should be followed up as soon as possible or go to the Emergency Department if any problems should occur.  Please show the CHEMOTHERAPY ALERT CARD or IMMUNOTHERAPY ALERT CARD at check-in to  the Emergency Department and triage nurse.  Should you have questions after your visit or need to cancel or reschedule your appointment, please contact [facility]  [phone] and follow the prompts.  Office hours are 8:00 a.m. to 4:30 p.m. Monday - Friday. Please note that voicemails left after 4:00 p.m. may not be returned until the following business day.  We are closed weekends and major holidays. You have access to a nurse at all times for urgent questions. Please call the main number to the clinic [phone] and follow the prompts.  For any non-urgent questions, you may also contact your provider using MyChart. We now offer e-Visits for anyone 39 and older to request care online for non-urgent symptoms. For details visit [email or web address].   Also download the MyChart app! Go to the app store, search "MyChart", open the app, select [place], and log in with your MyChart username and password.  Masks are optional in the cancer centers. If you would like for your care team to wear a mask while they are taking care of you, please let them know. For doctor visits, patients may have with them one support person who is at least 67 years old. At this time, visitors are not allowed in the infusion area.

## 2022-12-17 ENCOUNTER — Inpatient Hospital Stay: Payer: BC Managed Care – PPO

## 2022-12-17 ENCOUNTER — Other Ambulatory Visit: Payer: Self-pay | Admitting: Medical Oncology

## 2022-12-17 ENCOUNTER — Inpatient Hospital Stay: Payer: BC Managed Care – PPO | Admitting: Nurse Practitioner

## 2022-12-17 ENCOUNTER — Encounter: Payer: Self-pay | Admitting: Internal Medicine

## 2022-12-21 ENCOUNTER — Inpatient Hospital Stay: Payer: BC Managed Care – PPO

## 2022-12-21 VITALS — BP 127/60 | HR 76 | Resp 18

## 2022-12-21 DIAGNOSIS — D649 Anemia, unspecified: Secondary | ICD-10-CM

## 2022-12-21 DIAGNOSIS — D473 Essential (hemorrhagic) thrombocythemia: Secondary | ICD-10-CM | POA: Diagnosis not present

## 2022-12-21 MED ORDER — ACETAMINOPHEN 325 MG PO TABS
650.0000 mg | ORAL_TABLET | Freq: Once | ORAL | Status: AC
  Administered 2022-12-21: 650 mg via ORAL
  Filled 2022-12-21: qty 2

## 2022-12-21 MED ORDER — SODIUM CHLORIDE 0.9 % IV SOLN
Freq: Once | INTRAVENOUS | Status: AC
  Filled 2022-12-21: qty 250

## 2022-12-21 MED ORDER — SODIUM CHLORIDE 0.9 % IV SOLN
200.0000 mg | Freq: Once | INTRAVENOUS | Status: AC
  Administered 2022-12-21: 200 mg via INTRAVENOUS
  Filled 2022-12-21: qty 200

## 2022-12-21 NOTE — Patient Instructions (Signed)

## 2022-12-22 ENCOUNTER — Other Ambulatory Visit: Payer: Self-pay | Admitting: *Deleted

## 2022-12-22 ENCOUNTER — Inpatient Hospital Stay: Payer: BC Managed Care – PPO

## 2022-12-22 DIAGNOSIS — D649 Anemia, unspecified: Secondary | ICD-10-CM

## 2022-12-29 ENCOUNTER — Encounter: Payer: Self-pay | Admitting: Medical Oncology

## 2022-12-29 ENCOUNTER — Inpatient Hospital Stay: Payer: BC Managed Care – PPO

## 2022-12-29 ENCOUNTER — Inpatient Hospital Stay (HOSPITAL_BASED_OUTPATIENT_CLINIC_OR_DEPARTMENT_OTHER): Payer: BC Managed Care – PPO | Admitting: Medical Oncology

## 2022-12-29 VITALS — BP 128/85 | HR 77 | Temp 96.4°F | Resp 16 | Ht 69.5 in | Wt 183.0 lb

## 2022-12-29 VITALS — BP 114/77 | HR 67 | Resp 17

## 2022-12-29 DIAGNOSIS — D649 Anemia, unspecified: Secondary | ICD-10-CM

## 2022-12-29 DIAGNOSIS — D473 Essential (hemorrhagic) thrombocythemia: Secondary | ICD-10-CM

## 2022-12-29 DIAGNOSIS — E611 Iron deficiency: Secondary | ICD-10-CM

## 2022-12-29 LAB — CBC WITH DIFFERENTIAL (CANCER CENTER ONLY)
Abs Immature Granulocytes: 0.01 10*3/uL (ref 0.00–0.07)
Basophils Absolute: 0.1 10*3/uL (ref 0.0–0.1)
Basophils Relative: 2 %
Eosinophils Absolute: 0.2 10*3/uL (ref 0.0–0.5)
Eosinophils Relative: 2 %
HCT: 44.1 % (ref 39.0–52.0)
Hemoglobin: 14.3 g/dL (ref 13.0–17.0)
Immature Granulocytes: 0 %
Lymphocytes Relative: 19 %
Lymphs Abs: 1.2 10*3/uL (ref 0.7–4.0)
MCH: 32.6 pg (ref 26.0–34.0)
MCHC: 32.4 g/dL (ref 30.0–36.0)
MCV: 100.7 fL — ABNORMAL HIGH (ref 80.0–100.0)
Monocytes Absolute: 0.7 10*3/uL (ref 0.1–1.0)
Monocytes Relative: 11 %
Neutro Abs: 4.2 10*3/uL (ref 1.7–7.7)
Neutrophils Relative %: 66 %
Platelet Count: 479 10*3/uL — ABNORMAL HIGH (ref 150–400)
RBC: 4.38 MIL/uL (ref 4.22–5.81)
RDW: 14.6 % (ref 11.5–15.5)
WBC Count: 6.4 10*3/uL (ref 4.0–10.5)
nRBC: 0 % (ref 0.0–0.2)

## 2022-12-29 MED ORDER — SODIUM CHLORIDE 0.9 % IV SOLN
200.0000 mg | Freq: Once | INTRAVENOUS | Status: AC
  Administered 2022-12-29: 200 mg via INTRAVENOUS
  Filled 2022-12-29: qty 200

## 2022-12-29 MED ORDER — SODIUM CHLORIDE 0.9% FLUSH
10.0000 mL | Freq: Once | INTRAVENOUS | Status: AC | PRN
  Administered 2022-12-29: 10 mL
  Filled 2022-12-29: qty 10

## 2022-12-29 MED ORDER — SODIUM CHLORIDE 0.9 % IV SOLN
Freq: Once | INTRAVENOUS | Status: AC
  Filled 2022-12-29: qty 250

## 2022-12-29 MED ORDER — ACETAMINOPHEN 325 MG PO TABS
650.0000 mg | ORAL_TABLET | Freq: Once | ORAL | Status: AC
  Administered 2022-12-29: 650 mg via ORAL
  Filled 2022-12-29: qty 2

## 2022-12-29 NOTE — Patient Instructions (Signed)

## 2022-12-29 NOTE — Progress Notes (Signed)
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  Significant  dyspoiesis in the erythroid or granulocytic cell lines is not seen and  no increase in blastic cells identified.  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  There is also slight plasmacytosis with lack of large  aggregates or sheets.  Special stains to assess plasma cell clonality  and degree of fibrosis will be performed and the results reported in an  addendum.  Correlation with cytogenetic studies is recommended.   MICROSCOPIC DESCRIPTION:    PERIPHERAL BLOOD SMEAR: The red blood cells display mild to moderate  anisopoikilocytosis with microcytic cells, elliptocytes, teardrop cells,  target cells.  There is moderate polychromasia.  The white blood cells  are normal in number with scattered hypogranular neutrophils.  The  platelets are increased in number.   BONE MARROW  ASPIRATE: Bone marrow particles present  Erythroid precursors: Orderly and progressive maturation  Granulocytic precursors: Orderly and progressive maturation for the most  part.  No increase in blastic cells identified  Megakaryocytes: Increased in number with many large, atypical forms, and  small and/or hypolobated cells.  Lymphocytes/plasma cells: The plasma cells are slightly increased in  number representing 6% of all cells with lack of large aggregates or  sheets.  Large lymphoid aggregates are not seen.   TOUCH PREPARATIONS: Mixture of cell types present   CLOT AND BIOPSY: The sections show 60 to 70% cellularity with a mixture  of myeloid cell types but with prominent increase in megakaryocytes with  clustering.  Many of the megakaryocytes display abnormal morphology.  Occasional small interstitial and focally para-trabecular lymphoid  aggregates composed of small lymphoid cells are seen.   IRON STAIN: Iron stains are performed on a bone marrow aspirate or touch  imprint smear and section of clot. The controls stained appropriately.        Storage Iron: Present, scanty       Ring Sideroblasts: Absent   # # EGD- [Dr.Ganum; [place]]. On zantac.    # Left hip replacement ------------------------------------------------   Tuesday Jan 05, 2022      Essential thrombocythemia [facility])    INTERVAL HISTORY: Alone.  Ambulating independently. Unaccompanied.   Patient is very pleasant 67 year old male, with history of ET and IDA, currently on low dose hydrea and PRN IV iron who returns to clinic for recheck of his platelets. At his last visit on 12/15/2022 he reported feeling as if his iron levels were low. Labs confirmed this and it was recommended that he start on IV iron supplementation which he had done well with previously. His CBC at this visit also  showed a significant elevation in his platelet levels. He is here today for lab recheck following IV iron administration to ensure his  platelet levels would return to baseline.   He states that he has been feeling well- possibly better than previous in terms of fatigue/SOB. He has not reported any bleeding, bruising, worsening pains.   Review of Systems  Constitutional:  Positive for malaise/fatigue. Negative for chills, diaphoresis, fever and weight loss.  HENT:  Negative for nosebleeds and sore throat.   Eyes:  Negative for double vision.  Respiratory:  Negative for cough, hemoptysis, sputum production, shortness of breath and wheezing.   Cardiovascular:  Negative for chest pain, palpitations, orthopnea and leg swelling.  Gastrointestinal:  Negative for abdominal pain, blood in stool, constipation, diarrhea, heartburn, melena, nausea and vomiting.  Genitourinary:  Negative for dysuria, frequency and urgency.  Musculoskeletal:  Positive for joint pain. Negative for back pain.  Skin: Negative.  Negative for itching and rash.  Neurological:  Negative for dizziness, tingling, focal weakness, weakness and headaches.  Endo/Heme/Allergies:  Does not bruise/bleed easily.  Psychiatric/Behavioral:  Negative for depression. The patient is not nervous/anxious and does not have insomnia.    PAST MEDICAL HISTORY :  Past Medical History:  Diagnosis Date   Arthritis    osteoarthritis -hips,spine,hands and left wrist.   Arthritis    Complication of anesthesia    GERD (gastroesophageal reflux disease)    Pneumonia    PONV (postoperative nausea and vomiting)    Sleep apnea    Thrombocytosis    Unspecified diseases of blood and blood-forming organs    "essential hyperthrombocytosis"    PAST SURGICAL HISTORY :   Past Surgical History:  Procedure Laterality Date   BACK SURGERY  June 2015   ROTATOR CUFF REPAIR     x3(lt x2), rt. x1   TOTAL HIP ARTHROPLASTY Left 02/11/2014   Procedure: LEFT TOTAL HIP ARTHROPLASTY ANTERIOR APPROACH;  Surgeon: Loanne Drilling, MD;  Location: [facility];  Service: Orthopedics;  Laterality: Left;   TOTAL  HIP ARTHROPLASTY Right 03/11/2021   Procedure: TOTAL HIP ARTHROPLASTY ANTERIOR APPROACH;  Surgeon: Ollen Gross, MD;  Location: [facility];  Service: Orthopedics;  Laterality: Right;    FAMILY HISTORY :   Family History  Problem Relation Age of Onset   Cancer Maternal Aunt    Cancer Paternal Aunt    Stroke Maternal Grandfather     SOCIAL HISTORY:   Social History   Tobacco Use   Smoking status: Never   Smokeless tobacco: Never  Vaping Use   Vaping Use: Never used  Substance Use Topics   Alcohol use: Yes    Alcohol/week: 2.0 standard drinks of alcohol    Types: 2 Cans of beer per week    Comment: beer/ wine 2glasses  per night   Drug use: No    ALLERGIES:  is allergic to hornet venom, wasp venom protein, vicodin [hydrocodone-acetaminophen], other, and penicillins.  MEDICATIONS:  Current Outpatient Medications  Medication Sig Dispense Refill   aspirin EC 81 MG tablet Take 81 mg by mouth daily. Swallow whole.     cyclobenzaprine (FLEXERIL) 5 MG tablet Take 2 tablets (10 mg total) by mouth 3 (three) times daily as needed for muscle spasms. 40 tablet 0   EPINEPHrine 0.3 mg/0.3 mL IJ SOAJ injection Inject 0.3 mg into the muscle as needed for anaphylaxis.  0   famotidine (PEPCID) 10 MG tablet Take by mouth.     Fe Bisgly-Vit C-Vit B12-FA (GENTLE IRON PO)  Take by mouth.     fluticasone (FLONASE) 50 MCG/ACT nasal spray Place 1 spray into both nostrils daily.     hydroxyurea (HYDREA) 500 MG capsule TAKE 1 CAPSULE (500 MG TOTAL) DAILY 30 capsule 1   loratadine (CLARITIN) 10 MG tablet Take 10 mg by mouth daily.     LUTEIN PO Take by mouth.     metaxalone (SKELAXIN) 800 MG tablet Take 800 mg by mouth 3 (three) times daily.     sildenafil (REVATIO) 20 MG tablet Take 60 mg by mouth daily as needed (ED).     acetaminophen (TYLENOL) 500 MG tablet Take 500 mg by mouth every 6 (six) hours as needed. (Patient not taking: Reported on 06/28/2022)     albuterol (VENTOLIN HFA) 108 (90 Base) MCG/ACT  inhaler Inhale 2 puffs into the lungs every 6 (six) hours as needed for wheezing or shortness of breath. (Patient not taking: Reported on 06/28/2022) 8 g 2   No current facility-administered medications for this visit.    PHYSICAL EXAMINATION: ECOG PERFORMANCE STATUS: 0 - Asymptomatic  BP 128/85 (BP Location: Left Arm, Patient Position: Sitting, Cuff Size: Normal)   Pulse 77   Temp (!) 96.4 F (35.8 C) (Tympanic)   Resp 16   Ht 5' 9.5" (1.765 m)   Wt 183 lb (83 kg)   SpO2 98%   BMI 26.64 kg/m   Filed Weights   12/29/22 1326  Weight: 183 lb (83 kg)    Physical Exam Constitutional:      Appearance: He is not ill-appearing.  Cardiovascular:     Rate and Rhythm: Normal rate and regular rhythm.  Pulmonary:     Effort: No respiratory distress.     Breath sounds: Normal breath sounds. No wheezing.  Musculoskeletal:        General: No tenderness.  Skin:    General: Skin is warm.     Coloration: Skin is not pale.  Neurological:     Mental Status: He is alert and oriented to person, place, and time.  Psychiatric:        Mood and Affect: Mood and affect normal.        Behavior: Behavior normal.    LABORATORY DATA:  I have reviewed the data as listed Lab Results  Component Value Date   WBC 6.4 12/29/2022   NEUTROABS 4.2 12/29/2022   HGB 14.3 12/29/2022   HCT 44.1 12/29/2022   MCV 100.7 (H) 12/29/2022   PLT 479 (H) 12/29/2022      Latest Ref Rng & Units 12/15/2022    3:10 PM 09/23/2022    2:24 PM 06/09/2022    2:47 PM  CMP  Glucose 70 - 99 mg/dL 829  562  130   BUN 8 - 23 mg/dL 16  18  18    Creatinine 0.61 - 1.24 mg/dL 8.65  7.84  6.96   Sodium 135 - 145 mmol/L 132  136  136   Potassium 3.5 - 5.1 mmol/L 3.8  4.1  4.0   Chloride 98 - 111 mmol/L 101  102  104   CO2 22 - 32 mmol/L 23  25  26    Calcium 8.9 - 10.3 mg/dL 8.8  8.8  8.9   Total Protein 6.5 - 8.1 g/dL 7.1     Total Bilirubin 0.3 - 1.2 mg/dL 1.0     Alkaline Phos 38 - 126 U/L 64     AST 15 - 41 U/L 22      ALT 0 -  44 U/L 18        ASSESSMENT & PLAN:  No diagnosis found.  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 low clinical suspicion for any acute process. April 2023- Bone marrow Bx- s/o MPN; NEGATIVE for any fibrosis; or acute leukemia process. Platelets have improved significantly with IV iron administration as we hoped. He will continue his IV iron course.   # Iron deficient anemia- Likely secondary to his significant hemorrhoidal bleeding.Marland Kitchen He is s/p endoscopy, colonoscopy, and capsule study w/o clear etiology otherwise. I have again suggested a proctologist. Continue IV iron.     # Left Hip pain: s/p second opinion with Ortho. Unchanged.    # DISPOSITION: Venofer today RTC 2 weeks APP, labs as previously planned.     Rushie Chestnut, PA-C 12/29/2022

## 2022-12-29 NOTE — Progress Notes (Signed)
Patient tolerated Venofer infusion well. Pre-med given. Explained recommendation of 30 min post monitoring. Patient refused to wait post monitoring. Educated on what signs to watch for & to call with any concerns. No questions, discharged. Stable

## 2023-01-05 ENCOUNTER — Inpatient Hospital Stay: Payer: BC Managed Care – PPO

## 2023-01-05 VITALS — BP 129/79 | HR 78 | Temp 97.2°F | Resp 16

## 2023-01-05 DIAGNOSIS — D473 Essential (hemorrhagic) thrombocythemia: Secondary | ICD-10-CM | POA: Diagnosis not present

## 2023-01-05 DIAGNOSIS — D649 Anemia, unspecified: Secondary | ICD-10-CM

## 2023-01-05 MED ORDER — SODIUM CHLORIDE 0.9 % IV SOLN
Freq: Once | INTRAVENOUS | Status: AC
  Filled 2023-01-05: qty 250

## 2023-01-05 MED ORDER — ACETAMINOPHEN 325 MG PO TABS: 650.0000 mg | ORAL_TABLET | Freq: Once | ORAL | Status: DC

## 2023-01-05 MED ORDER — SODIUM CHLORIDE 0.9 % IV SOLN
200.0000 mg | Freq: Once | INTRAVENOUS | Status: AC
  Administered 2023-01-05: 200 mg via INTRAVENOUS
  Filled 2023-01-05: qty 200

## 2023-01-05 NOTE — Progress Notes (Signed)
Pt states he ran fever after his first iron infusion. Since then states he has taken premeds at home or only taken 325mg  tylenol prior to infusion. Pt states he missed premeds one time and did not have same febrile symptoms and wanted to try infusion without premeds. Pt declined 30 minute post observation period. Educated about delayed reactions and pt verbalized understanding.

## 2023-01-12 ENCOUNTER — Inpatient Hospital Stay: Payer: BC Managed Care – PPO

## 2023-01-12 VITALS — BP 110/70 | HR 74 | Temp 97.1°F | Resp 18

## 2023-01-12 DIAGNOSIS — D473 Essential (hemorrhagic) thrombocythemia: Secondary | ICD-10-CM | POA: Diagnosis not present

## 2023-01-12 DIAGNOSIS — D649 Anemia, unspecified: Secondary | ICD-10-CM

## 2023-01-12 MED ORDER — SODIUM CHLORIDE 0.9 % IV SOLN
200.0000 mg | Freq: Once | INTRAVENOUS | Status: AC
  Administered 2023-01-12: 200 mg via INTRAVENOUS
  Filled 2023-01-12: qty 200

## 2023-01-12 MED ORDER — ACETAMINOPHEN 325 MG PO TABS: 650.0000 mg | ORAL_TABLET | Freq: Once | ORAL | Status: DC

## 2023-01-12 MED ORDER — SODIUM CHLORIDE 0.9 % IV SOLN
Freq: Once | INTRAVENOUS | Status: AC
  Filled 2023-01-12: qty 250

## 2023-01-12 NOTE — Patient Instructions (Signed)

## 2023-01-14 ENCOUNTER — Other Ambulatory Visit: Payer: Self-pay

## 2023-01-17 ENCOUNTER — Inpatient Hospital Stay: Payer: BC Managed Care – PPO | Attending: Internal Medicine

## 2023-01-17 DIAGNOSIS — M2550 Pain in unspecified joint: Secondary | ICD-10-CM | POA: Insufficient documentation

## 2023-01-17 DIAGNOSIS — E611 Iron deficiency: Secondary | ICD-10-CM

## 2023-01-17 DIAGNOSIS — R5383 Other fatigue: Secondary | ICD-10-CM | POA: Diagnosis not present

## 2023-01-17 DIAGNOSIS — R0602 Shortness of breath: Secondary | ICD-10-CM | POA: Insufficient documentation

## 2023-01-17 DIAGNOSIS — D473 Essential (hemorrhagic) thrombocythemia: Secondary | ICD-10-CM | POA: Insufficient documentation

## 2023-01-17 DIAGNOSIS — Z809 Family history of malignant neoplasm, unspecified: Secondary | ICD-10-CM | POA: Diagnosis not present

## 2023-01-17 DIAGNOSIS — Z79899 Other long term (current) drug therapy: Secondary | ICD-10-CM | POA: Insufficient documentation

## 2023-01-17 DIAGNOSIS — Z823 Family history of stroke: Secondary | ICD-10-CM | POA: Diagnosis not present

## 2023-01-17 DIAGNOSIS — D509 Iron deficiency anemia, unspecified: Secondary | ICD-10-CM | POA: Diagnosis present

## 2023-01-17 DIAGNOSIS — Z880 Allergy status to penicillin: Secondary | ICD-10-CM | POA: Diagnosis not present

## 2023-01-17 DIAGNOSIS — Z885 Allergy status to narcotic agent status: Secondary | ICD-10-CM | POA: Diagnosis not present

## 2023-01-17 DIAGNOSIS — D649 Anemia, unspecified: Secondary | ICD-10-CM

## 2023-01-17 LAB — CBC WITH DIFFERENTIAL (CANCER CENTER ONLY)
Abs Immature Granulocytes: 0.03 10*3/uL (ref 0.00–0.07)
Basophils Absolute: 0.1 10*3/uL (ref 0.0–0.1)
Basophils Relative: 2 %
Eosinophils Absolute: 0.2 10*3/uL (ref 0.0–0.5)
Eosinophils Relative: 3 %
HCT: 40.9 % (ref 39.0–52.0)
Hemoglobin: 13.5 g/dL (ref 13.0–17.0)
Immature Granulocytes: 1 %
Lymphocytes Relative: 23 %
Lymphs Abs: 1.4 10*3/uL (ref 0.7–4.0)
MCH: 33 pg (ref 26.0–34.0)
MCHC: 33 g/dL (ref 30.0–36.0)
MCV: 100 fL (ref 80.0–100.0)
Monocytes Absolute: 0.7 10*3/uL (ref 0.1–1.0)
Monocytes Relative: 11 %
Neutro Abs: 3.7 10*3/uL (ref 1.7–7.7)
Neutrophils Relative %: 60 %
Platelet Count: 635 10*3/uL — ABNORMAL HIGH (ref 150–400)
RBC: 4.09 MIL/uL — ABNORMAL LOW (ref 4.22–5.81)
RDW: 15 % (ref 11.5–15.5)
WBC Count: 6.2 10*3/uL (ref 4.0–10.5)
nRBC: 0 % (ref 0.0–0.2)

## 2023-01-17 LAB — RETICULOCYTES
Immature Retic Fract: 15.7 % (ref 2.3–15.9)
RBC.: 4.05 MIL/uL — ABNORMAL LOW (ref 4.22–5.81)
Retic Count, Absolute: 69.3 10*3/uL (ref 19.0–186.0)
Retic Ct Pct: 1.7 % (ref 0.4–3.1)

## 2023-01-17 LAB — FERRITIN: Ferritin: 110 ng/mL (ref 24–336)

## 2023-01-17 LAB — IRON AND TIBC
Iron: 74 ug/dL (ref 45–182)
Saturation Ratios: 21 % (ref 17.9–39.5)
TIBC: 357 ug/dL (ref 250–450)
UIBC: 283 ug/dL

## 2023-01-17 LAB — LACTATE DEHYDROGENASE: LDH: 121 U/L (ref 98–192)

## 2023-01-19 ENCOUNTER — Encounter: Payer: Self-pay | Admitting: Medical Oncology

## 2023-01-19 ENCOUNTER — Inpatient Hospital Stay (HOSPITAL_BASED_OUTPATIENT_CLINIC_OR_DEPARTMENT_OTHER): Payer: BC Managed Care – PPO | Admitting: Medical Oncology

## 2023-01-19 ENCOUNTER — Inpatient Hospital Stay: Payer: BC Managed Care – PPO

## 2023-01-19 ENCOUNTER — Other Ambulatory Visit: Payer: BC Managed Care – PPO

## 2023-01-19 VITALS — BP 109/84 | HR 77 | Temp 97.1°F | Wt 182.0 lb

## 2023-01-19 DIAGNOSIS — D473 Essential (hemorrhagic) thrombocythemia: Secondary | ICD-10-CM

## 2023-01-19 DIAGNOSIS — E611 Iron deficiency: Secondary | ICD-10-CM | POA: Diagnosis not present

## 2023-01-19 DIAGNOSIS — D649 Anemia, unspecified: Secondary | ICD-10-CM

## 2023-01-19 MED ORDER — SODIUM CHLORIDE 0.9 % IV SOLN
Freq: Once | INTRAVENOUS | Status: DC
  Filled 2023-01-19: qty 250

## 2023-01-19 MED ORDER — ACETAMINOPHEN 325 MG PO TABS: 650.0000 mg | ORAL_TABLET | Freq: Once | ORAL | Status: DC

## 2023-01-19 NOTE — Progress Notes (Signed)
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  Significant  dyspoiesis in the erythroid or granulocytic cell lines is not seen and  no increase in blastic cells identified.  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  There is also slight plasmacytosis with lack of large  aggregates or sheets.  Special stains to assess plasma cell clonality  and degree of fibrosis will be performed and the results reported in an  addendum.  Correlation with cytogenetic studies is recommended.   MICROSCOPIC DESCRIPTION:    PERIPHERAL BLOOD SMEAR: The red blood cells display mild to moderate  anisopoikilocytosis with microcytic cells, elliptocytes, teardrop cells,  target cells.  There is moderate polychromasia.  The white blood cells  are normal in number with scattered hypogranular neutrophils.  The  platelets are increased in number.   BONE MARROW  ASPIRATE: Bone marrow particles present  Erythroid precursors: Orderly and progressive maturation  Granulocytic precursors: Orderly and progressive maturation for the most  part.  No increase in blastic cells identified  Megakaryocytes: Increased in number with many large, atypical forms, and  small and/or hypolobated cells.  Lymphocytes/plasma cells: The plasma cells are slightly increased in  number representing 6% of all cells with lack of large aggregates or  sheets.  Large lymphoid aggregates are not seen.   TOUCH PREPARATIONS: Mixture of cell types present   CLOT AND BIOPSY: The sections show 60 to 70% cellularity with a mixture  of myeloid cell types but with prominent increase in megakaryocytes with  clustering.  Many of the megakaryocytes display abnormal morphology.  Occasional small interstitial and focally para-trabecular lymphoid  aggregates composed of small lymphoid cells are seen.   IRON STAIN: Iron stains are performed on a bone marrow aspirate or touch  imprint smear and section of clot. The controls stained appropriately.        Storage Iron: Present, scanty       Ring Sideroblasts: Absent   # # EGD- [Dr.Ganum; [place]]. On zantac.    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  Ambulating independently. Unaccompanied.   Patient is very pleasant 67 year old male, with history of ET and IDA, currently on low dose hydrea, asa, and PRN IV iron who returns to clinic for recheck of his iron levels and platelets.   He states that he has been tolerating the IV venofer infusions well. He has noticed some improvement in his fatigue and SOB but not as much as he was expecting. He still struggles with bleeding hemorrhoids but  has a proctology appointment tomorrow. He also is scheduled for a hip replacement revision of his right hip later this month.   He  has not reported any bleeding, bruising.   Review of Systems  Constitutional:  Positive for malaise/fatigue. Negative for chills, diaphoresis, fever and weight loss.  HENT:  Negative for nosebleeds and sore throat.   Eyes:  Negative for double vision.  Respiratory:  Negative for cough, hemoptysis, sputum production, shortness of breath and wheezing.   Cardiovascular:  Negative for chest pain, palpitations, orthopnea and leg swelling.  Gastrointestinal:  Negative for abdominal pain, blood in stool, constipation, diarrhea, heartburn, melena, nausea and vomiting.  Genitourinary:  Negative for dysuria, frequency and urgency.  Musculoskeletal:  Positive for joint pain. Negative for back pain.  Skin: Negative.  Negative for itching and rash.  Neurological:  Negative for dizziness, tingling, focal weakness, weakness and headaches.  Endo/Heme/Allergies:  Does not bruise/bleed easily.  Psychiatric/Behavioral:  Negative for depression. The patient is not nervous/anxious and does not have insomnia.    PAST MEDICAL HISTORY :  Past Medical History:  Diagnosis Date   Arthritis    osteoarthritis -hips,spine,hands and left wrist.   Arthritis    Complication of anesthesia    GERD (gastroesophageal reflux disease)    Pneumonia    PONV (postoperative nausea and vomiting)    Sleep apnea    Thrombocytosis    Unspecified diseases of blood and blood-forming organs    "essential hyperthrombocytosis"    PAST SURGICAL HISTORY :   Past Surgical History:  Procedure Laterality Date   BACK SURGERY  June 2015   ROTATOR CUFF REPAIR     x3(lt x2), rt. x1   TOTAL HIP ARTHROPLASTY Left 02/11/2014   Procedure: LEFT TOTAL HIP ARTHROPLASTY ANTERIOR APPROACH;  Surgeon: Loanne Drilling, MD;  Location: [facility];  Service: Orthopedics;  Laterality: Left;   TOTAL HIP ARTHROPLASTY Right 03/11/2021   Procedure: TOTAL HIP ARTHROPLASTY ANTERIOR APPROACH;  Surgeon: Ollen Gross, MD;  Location: [facility];  Service: Orthopedics;   Laterality: Right;    FAMILY HISTORY :   Family History  Problem Relation Age of Onset   Cancer Maternal Aunt    Cancer Paternal Aunt    Stroke Maternal Grandfather     SOCIAL HISTORY:   Social History   Tobacco Use   Smoking status: Never   Smokeless tobacco: Never  Vaping Use   Vaping Use: Never used  Substance Use Topics   Alcohol use: Yes    Alcohol/week: 2.0 standard drinks of alcohol    Types: 2 Cans of beer per week    Comment: beer/ wine 2glasses  per night   Drug use: No    ALLERGIES:  is allergic to hornet venom, wasp venom protein, vicodin [hydrocodone-acetaminophen], other, and penicillins.  MEDICATIONS:  Current Outpatient Medications  Medication Sig Dispense Refill   aspirin EC 81 MG tablet Take 81 mg by mouth daily. Swallow whole.     cyclobenzaprine (FLEXERIL) 5 MG tablet Take 2 tablets (10 mg total) by mouth 3 (three) times daily as needed for muscle spasms. 40 tablet 0   EPINEPHrine 0.3 mg/0.3 mL IJ SOAJ injection Inject 0.3 mg into the muscle as needed for anaphylaxis.  0   famotidine (PEPCID) 10 MG tablet Take by mouth.     Fe Bisgly-Vit C-Vit B12-FA (GENTLE IRON PO) Take by mouth.     fluticasone (FLONASE) 50 MCG/ACT nasal spray Place 1 spray into both nostrils daily.     hydroxyurea (  HYDREA) 500 MG capsule TAKE 1 CAPSULE (500 MG TOTAL) DAILY 30 capsule 1   loratadine (CLARITIN) 10 MG tablet Take 10 mg by mouth daily.     LUTEIN PO Take by mouth.     metaxalone (SKELAXIN) 800 MG tablet Take 800 mg by mouth 3 (three) times daily.     sildenafil (REVATIO) 20 MG tablet Take 60 mg by mouth daily as needed (ED).     acetaminophen (TYLENOL) 500 MG tablet Take 500 mg by mouth every 6 (six) hours as needed. (Patient not taking: Reported on 06/28/2022)     albuterol (VENTOLIN HFA) 108 (90 Base) MCG/ACT inhaler Inhale 2 puffs into the lungs every 6 (six) hours as needed for wheezing or shortness of breath. (Patient not taking: Reported on 06/28/2022) 8 g 2    No current facility-administered medications for this visit.   Facility-Administered Medications Ordered in Other Visits  Medication Dose Route Frequency Provider Last Rate Last Admin   0.9 %  sodium chloride infusion   Intravenous Once Jinger Middlesworth M, PA-C       acetaminophen (TYLENOL) tablet 650 mg  650 mg Oral Once Joslin Doell M, [place]        PHYSICAL EXAMINATION: ECOG PERFORMANCE STATUS: 0 - Asymptomatic  BP 109/84 (BP Location: Left Arm, Patient Position: Sitting)   Pulse 77   Temp (!) 97.1 F (36.2 C) (Tympanic)   Wt 182 lb (82.6 kg)   SpO2 98%   BMI 26.49 kg/m   Filed Weights   01/19/23 1313  Weight: 182 lb (82.6 kg)    Physical Exam Constitutional:      Appearance: He is not ill-appearing.  Cardiovascular:     Rate and Rhythm: Normal rate and regular rhythm.  Pulmonary:     Effort: No respiratory distress.     Breath sounds: Normal breath sounds. No wheezing.  Musculoskeletal:        General: No tenderness.  Skin:    General: Skin is warm.     Coloration: Skin is not pale.  Neurological:     Mental Status: He is alert and oriented to person, place, and time.  Psychiatric:        Mood and Affect: Mood and affect normal.        Behavior: Behavior normal.    LABORATORY DATA:  I have reviewed the data as listed Lab Results  Component Value Date   WBC 6.2 01/17/2023   NEUTROABS 3.7 01/17/2023   HGB 13.5 01/17/2023   HCT 40.9 01/17/2023   MCV 100.0 01/17/2023   PLT 635 (H) 01/17/2023      Latest Ref Rng & Units 12/15/2022    3:10 PM 09/23/2022    2:24 PM 06/09/2022    2:47 PM  CMP  Glucose 70 - 99 mg/dL 161  096  045   BUN 8 - 23 mg/dL 16  18  18    Creatinine 0.61 - 1.24 mg/dL 4.09  8.11  9.14   Sodium 135 - 145 mmol/L 132  136  136   Potassium 3.5 - 5.1 mmol/L 3.8  4.1  4.0   Chloride 98 - 111 mmol/L 101  102  104   CO2 22 - 32 mmol/L 23  25  26    Calcium 8.9 - 10.3 mg/dL 8.8  8.8  8.9   Total Protein 6.5 - 8.1 g/dL 7.1     Total  Bilirubin 0.3 - 1.2 mg/dL 1.0     Alkaline Phos 38 - 126 U/L  64     AST 15 - 41 U/L 22     ALT 0 - 44 U/L 18        ASSESSMENT & PLAN:  Encounter Diagnoses  Name Primary?   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 low clinical suspicion for any acute process. April 2023- Bone marrow Bx- s/o MPN; NEGATIVE for any fibrosis; or acute leukemia process. Initially platelet count improved with IV iron infusions. Now up a bit to 635. Looks like his platelets wax and wane from the 300s to 700s with average being 400s. He has pre-op recheck in about 2 weeks. He will relay results to me.   # Iron deficient anemia- Likely secondary to his significant hemorrhoidal bleeding. He is s/p endoscopy, colonoscopy, and capsule study w/o clear etiology otherwise. Happy to hear that he is seeing the proctologist tomorrow. I have recommended that he continue IV iron. RTC 2 months (given that he will be acute post op in 1 months time)    # DISPOSITION: RTC 2 months : D1 labs ( CBC w/, CMP, retic, iron, ferritin, LDH) D2 APP +- Venofer -[place]    Rushie Chestnut, PA-C 01/19/2023

## 2023-01-19 NOTE — Progress Notes (Signed)
Per PA wrap-up, pt to receive IV venofer today. Pt was retrieved from waiting area and IV placed. Reached out to PA for IV venofer orders. Per PA, she reviewed the chart and stated that pt does not actually need venofer today as he has received his full course. IV removed and patient updated. He verbalized understanding.

## 2023-01-25 ENCOUNTER — Other Ambulatory Visit: Payer: Self-pay | Admitting: Internal Medicine

## 2023-01-25 DIAGNOSIS — D473 Essential (hemorrhagic) thrombocythemia: Secondary | ICD-10-CM

## 2023-01-25 NOTE — Telephone Encounter (Signed)
Component Ref Range & Units 8 d ago 3 wk ago 1 mo ago 4 mo ago 7 mo ago 11 mo ago 1 yr ago  WBC Count 4.0 - 10.5 K/uL 6.2 6.4 7.2 8.4 7.4 7.0 6.2  RBC 4.22 - 5.81 MIL/uL 4.09 Low  4.38 4.20 Low  4.24 4.09 Low  3.53 Low  3.63 Low   Hemoglobin 13.0 - 17.0 g/dL 16.1 09.6 04.5 40.9 81.1 10.4 Low  9.8 Low   HCT 39.0 - 52.0 % 40.9 44.1 41.8 42.7 41.2 32.9 Low  32.1 Low   MCV 80.0 - 100.0 fL 100.0 100.7 High  99.5 100.7 High  100.7 High  93.2 88.4  MCH 26.0 - 34.0 pg 33.0 32.6 32.1 33.5 33.5 29.5 27.0  MCHC 30.0 - 36.0 g/dL 91.4 78.2 95.6 21.3 08.6 31.6 30.5  RDW 11.5 - 15.5 % 15.0 14.6 14.2 14.1 14.2 18.8 High  23.9 High   Platelet Count 150 - 400 K/uL 635 High  479 High  809 High  571 High  602 High  432 High  402 High   nRBC 0.0 - 0.2 % 0.0 0.0 0.0 0.0 0.0 0.0 0.0  Neutrophils Relative % % 60 66 63 65 59 58 58  Neutro Abs 1.7 - 7.7 K/uL 3.7 4.2 4.6 5.4 4.4 4.1 3.6  Lymphocytes Relative % 23 19 20 20 23 25 24   Lymphs Abs 0.7 - 4.0 K/uL 1.4 1.2 1.4 1.7 1.7 1.8 1.5  Monocytes Relative % 11 11 11 12 13 13 12   Monocytes Absolute 0.1 - 1.0 K/uL 0.7 0.7 0.8 1.0 1.0 0.9 0.7  Eosinophils Relative % 3 2 3 2 2 2 2   Eosinophils Absolute 0.0 - 0.5 K/uL 0.2 0.2 0.2 0.2 0.2 0.1 0.1  Basophils Relative % 2 2 2 1 2 2 3   Basophils Absolute 0.0 - 0.1 K/uL 0.1 0.1 0.2 High  0.1 0.1 0.1 0.2 High   Immature Granulocytes % 1 0 1 0 1 0 1  Abs Immature Granulocytes 0.00 - 0.07 K/uL 0.03 0.01 CM 0.06 CM 0.02 CM 0.04 CM 0.02 CM 0.03 CM  Comment: Performed at [facility], [address] [place], [place] [address]  Resulting Agency [facility]         Specimen Collected: 01/17/23 13:02 Last Resulted: 01/17/23 13:34

## 2023-03-21 ENCOUNTER — Other Ambulatory Visit: Payer: Self-pay | Admitting: *Deleted

## 2023-03-21 DIAGNOSIS — D649 Anemia, unspecified: Secondary | ICD-10-CM

## 2023-03-21 DIAGNOSIS — D473 Essential (hemorrhagic) thrombocythemia: Secondary | ICD-10-CM

## 2023-03-22 ENCOUNTER — Other Ambulatory Visit: Payer: Self-pay | Admitting: Internal Medicine

## 2023-03-22 ENCOUNTER — Inpatient Hospital Stay: Payer: BC Managed Care – PPO | Attending: Internal Medicine

## 2023-03-22 DIAGNOSIS — D509 Iron deficiency anemia, unspecified: Secondary | ICD-10-CM | POA: Insufficient documentation

## 2023-03-22 DIAGNOSIS — D649 Anemia, unspecified: Secondary | ICD-10-CM

## 2023-03-22 DIAGNOSIS — Z79899 Other long term (current) drug therapy: Secondary | ICD-10-CM | POA: Diagnosis not present

## 2023-03-22 DIAGNOSIS — D473 Essential (hemorrhagic) thrombocythemia: Secondary | ICD-10-CM

## 2023-03-22 DIAGNOSIS — R5383 Other fatigue: Secondary | ICD-10-CM | POA: Diagnosis not present

## 2023-03-22 DIAGNOSIS — Z823 Family history of stroke: Secondary | ICD-10-CM | POA: Diagnosis not present

## 2023-03-22 DIAGNOSIS — Z8719 Personal history of other diseases of the digestive system: Secondary | ICD-10-CM | POA: Insufficient documentation

## 2023-03-22 DIAGNOSIS — Z885 Allergy status to narcotic agent status: Secondary | ICD-10-CM | POA: Insufficient documentation

## 2023-03-22 DIAGNOSIS — Z880 Allergy status to penicillin: Secondary | ICD-10-CM | POA: Insufficient documentation

## 2023-03-22 DIAGNOSIS — Z809 Family history of malignant neoplasm, unspecified: Secondary | ICD-10-CM | POA: Insufficient documentation

## 2023-03-22 DIAGNOSIS — M2550 Pain in unspecified joint: Secondary | ICD-10-CM | POA: Insufficient documentation

## 2023-03-22 LAB — CMP (CANCER CENTER ONLY)
ALT: 15 U/L (ref 0–44)
AST: 18 U/L (ref 15–41)
Albumin: 3.7 g/dL (ref 3.5–5.0)
Alkaline Phosphatase: 76 U/L (ref 38–126)
Anion gap: 6 (ref 5–15)
BUN: 20 mg/dL (ref 8–23)
CO2: 25 mmol/L (ref 22–32)
Calcium: 9 mg/dL (ref 8.9–10.3)
Chloride: 103 mmol/L (ref 98–111)
Creatinine: 0.71 mg/dL (ref 0.61–1.24)
GFR, Estimated: >60 mL/min (ref >60)
Glucose, Bld: 102 mg/dL — ABNORMAL HIGH (ref 70–99)
Potassium: 4.9 mmol/L (ref 3.5–5.1)
Sodium: 134 mmol/L — ABNORMAL LOW (ref 135–145)
Total Bilirubin: 0.8 mg/dL (ref 0.3–1.2)
Total Protein: 6.9 g/dL (ref 6.5–8.1)

## 2023-03-22 LAB — CBC WITH DIFFERENTIAL (CANCER CENTER ONLY)
Abs Immature Granulocytes: 0.02 10*3/uL (ref 0.00–0.07)
Basophils Absolute: 0.1 10*3/uL (ref 0.0–0.1)
Basophils Relative: 2 %
Eosinophils Absolute: 0.2 10*3/uL (ref 0.0–0.5)
Eosinophils Relative: 3 %
HCT: 39.3 % (ref 39.0–52.0)
Hemoglobin: 12.5 g/dL — ABNORMAL LOW (ref 13.0–17.0)
Immature Granulocytes: 0 %
Lymphocytes Relative: 18 %
Lymphs Abs: 1.3 10*3/uL (ref 0.7–4.0)
MCH: 30.7 pg (ref 26.0–34.0)
MCHC: 31.8 g/dL (ref 30.0–36.0)
MCV: 96.6 fL (ref 80.0–100.0)
Monocytes Absolute: 0.9 10*3/uL (ref 0.1–1.0)
Monocytes Relative: 13 %
Neutro Abs: 4.6 10*3/uL (ref 1.7–7.7)
Neutrophils Relative %: 64 %
Platelet Count: 566 10*3/uL — ABNORMAL HIGH (ref 150–400)
RBC: 4.07 MIL/uL — ABNORMAL LOW (ref 4.22–5.81)
RDW: 14.1 % (ref 11.5–15.5)
WBC Count: 7.1 10*3/uL (ref 4.0–10.5)
nRBC: 0 % (ref 0.0–0.2)

## 2023-03-22 LAB — FERRITIN: Ferritin: 18 ng/mL — ABNORMAL LOW (ref 24–336)

## 2023-03-22 LAB — LACTATE DEHYDROGENASE: LDH: 117 U/L (ref 98–192)

## 2023-03-22 LAB — IRON AND TIBC
Iron: 33 ug/dL — ABNORMAL LOW (ref 45–182)
Saturation Ratios: 9 % — ABNORMAL LOW (ref 17.9–39.5)
TIBC: 379 ug/dL (ref 250–450)
UIBC: 346 ug/dL

## 2023-03-23 ENCOUNTER — Inpatient Hospital Stay: Payer: BC Managed Care – PPO

## 2023-03-23 ENCOUNTER — Encounter: Payer: Self-pay | Admitting: Medical Oncology

## 2023-03-23 ENCOUNTER — Inpatient Hospital Stay: Payer: BC Managed Care – PPO | Admitting: Medical Oncology

## 2023-03-23 VITALS — BP 113/77 | HR 70

## 2023-03-23 VITALS — BP 125/80 | HR 72 | Temp 98.1°F | Wt 185.0 lb

## 2023-03-23 DIAGNOSIS — E611 Iron deficiency: Secondary | ICD-10-CM

## 2023-03-23 DIAGNOSIS — D649 Anemia, unspecified: Secondary | ICD-10-CM

## 2023-03-23 DIAGNOSIS — D473 Essential (hemorrhagic) thrombocythemia: Secondary | ICD-10-CM | POA: Diagnosis not present

## 2023-03-23 MED ORDER — SODIUM CHLORIDE 0.9 % IV SOLN
Freq: Once | INTRAVENOUS | Status: AC
  Filled 2023-03-23: qty 250

## 2023-03-23 MED ORDER — SODIUM CHLORIDE 0.9 % IV SOLN
300.0000 mg | Freq: Once | INTRAVENOUS | Status: AC
  Administered 2023-03-23: 300 mg via INTRAVENOUS
  Filled 2023-03-23: qty 300

## 2023-03-23 NOTE — Progress Notes (Signed)
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  Significant  dyspoiesis in the erythroid or granulocytic cell lines is not seen and  no increase in blastic cells identified.  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  There is also slight plasmacytosis with lack of large  aggregates or sheets.  Special stains to assess plasma cell clonality  and degree of fibrosis will be performed and the results reported in an  addendum.  Correlation with cytogenetic studies is recommended.   MICROSCOPIC DESCRIPTION:    PERIPHERAL BLOOD SMEAR: The red blood cells display mild to moderate  anisopoikilocytosis with microcytic cells, elliptocytes, teardrop cells,  target cells.  There is moderate polychromasia.  The white blood cells  are normal in number with scattered hypogranular neutrophils.  The  platelets are increased in number.   BONE  MARROW ASPIRATE: Bone marrow particles present  Erythroid precursors: Orderly and progressive maturation  Granulocytic precursors: Orderly and progressive maturation for the most  part.  No increase in blastic cells identified  Megakaryocytes: Increased in number with many large, atypical forms, and  small and/or hypolobated cells.  Lymphocytes/plasma cells: The plasma cells are slightly increased in  number representing 6% of all cells with lack of large aggregates or  sheets.  Large lymphoid aggregates are not seen.   TOUCH PREPARATIONS: Mixture of cell types present   CLOT AND BIOPSY: The sections show 60 to 70% cellularity with a mixture  of myeloid cell types but with prominent increase in megakaryocytes with  clustering.  Many of the megakaryocytes display abnormal morphology.  Occasional small interstitial and focally para-trabecular lymphoid  aggregates composed of small lymphoid cells are seen.   IRON STAIN: Iron stains are performed on a bone marrow aspirate or touch  imprint smear and section of clot. The controls stained appropriately.        Storage Iron: Present, scanty       Ring Sideroblasts: Absent   # # EGD- [Dr.Ganum; [place]]. On zantac.    # Left hip replacement ------------------------------------------------   Tuesday Jan 05, 2022      Essential thrombocythemia [facility])    INTERVAL HISTORY: Alone.  Ambulating independently. Unaccompanied.   Patient is very pleasant 67 year old male, with history of ET and IDA, currently on low dose hydrea, asa, and PRN IV iron who returns to clinic for recheck of his iron levels and platelets.   Today he is doing well. He is about 6 weeks out from a left hip replacement revision. This surgery seems to have gone well for him.  Prior to that surgery he was seen by a  proctologist at our suggestion given significant hemorrhoidal bleeding episodes. He reports that he has gone 3 weeks without bleeding. Prior to this he had one heavy  week of bleeding.  No history of celiac/IBS/crohns. No history of bariatric surgery. He has had GI work up including endoscopy/capsule study and colonoscopy-2022. He is taking a slow iron supplement every evening with pepcid. ETOH intake is about 2 beer per night. No known kidney or liver disease. No known splenomegaly. No big bruising episodes.   Review of Systems  Constitutional:  Positive for malaise/fatigue. Negative for chills, diaphoresis, fever and weight loss.  HENT:  Negative for nosebleeds and sore throat.   Eyes:  Negative for double vision.  Respiratory:  Negative for cough, hemoptysis, sputum production, shortness of breath and wheezing.   Cardiovascular:  Negative for chest pain, palpitations, orthopnea and leg swelling.  Gastrointestinal:  Negative for abdominal pain, blood in stool, constipation, diarrhea, heartburn, melena, nausea and vomiting.  Genitourinary:  Negative for dysuria, frequency and urgency.  Musculoskeletal:  Positive for joint pain. Negative for back pain.  Skin: Negative.  Negative for itching and rash.  Neurological:  Negative for dizziness, tingling, focal weakness, weakness and headaches.  Endo/Heme/Allergies:  Does not bruise/bleed easily.  Psychiatric/Behavioral:  Negative for depression. The patient is not nervous/anxious and does not have insomnia.    PAST MEDICAL HISTORY :  Past Medical History:  Diagnosis Date   Arthritis    osteoarthritis -hips,spine,hands and left wrist.   Arthritis    Complication of anesthesia    GERD (gastroesophageal reflux disease)    Pneumonia    PONV (postoperative nausea and vomiting)    Sleep apnea    Thrombocytosis    Unspecified diseases of blood and blood-forming organs    "essential hyperthrombocytosis"    PAST SURGICAL HISTORY :   Past Surgical History:  Procedure Laterality Date   BACK SURGERY  June 2015   ROTATOR CUFF REPAIR     x3(lt x2), rt. x1   TOTAL HIP ARTHROPLASTY Left 02/11/2014   Procedure:  LEFT TOTAL HIP ARTHROPLASTY ANTERIOR APPROACH;  Surgeon: Loanne Drilling, MD;  Location: [facility];  Service: Orthopedics;  Laterality: Left;   TOTAL HIP ARTHROPLASTY Right 03/11/2021   Procedure: TOTAL HIP ARTHROPLASTY ANTERIOR APPROACH;  Surgeon: Ollen Gross, MD;  Location: [facility];  Service: Orthopedics;  Laterality: Right;    FAMILY HISTORY :   Family History  Problem Relation Age of Onset   Cancer Maternal Aunt    Cancer Paternal Aunt    Stroke Maternal Grandfather     SOCIAL HISTORY:   Social History   Tobacco Use   Smoking status: Never   Smokeless tobacco: Never  Vaping Use   Vaping status: Never Used  Substance Use Topics   Alcohol use: Yes    Alcohol/week: 2.0 standard drinks of alcohol    Types: 2 Cans of beer per week    Comment: beer/ wine 2glasses  per night   Drug use: No    ALLERGIES:  is allergic to hornet venom, wasp venom protein, vicodin [hydrocodone-acetaminophen], other, and penicillins.  MEDICATIONS:  Current Outpatient Medications  Medication Sig Dispense Refill   aspirin EC 81 MG tablet Take 81 mg by mouth daily. Swallow whole.     cyclobenzaprine (FLEXERIL) 5 MG tablet Take 2 tablets (10 mg total) by mouth 3 (three) times daily as needed for muscle spasms. 40 tablet 0   EPINEPHrine 0.3 mg/0.3 mL IJ SOAJ injection Inject 0.3 mg into the muscle  as needed for anaphylaxis.  0   famotidine (PEPCID) 10 MG tablet Take by mouth.     Fe Bisgly-Vit C-Vit B12-FA (GENTLE IRON PO) Take by mouth.     fluticasone (FLONASE) 50 MCG/ACT nasal spray Place 1 spray into both nostrils daily.     hydroxyurea (HYDREA) 500 MG capsule TAKE 1 CAPSULE (500 MG TOTAL) DAILY 30 capsule 1   loratadine (CLARITIN) 10 MG tablet Take 10 mg by mouth daily.     LUTEIN PO Take by mouth.     metaxalone (SKELAXIN) 800 MG tablet Take 800 mg by mouth 3 (three) times daily.     sildenafil (REVATIO) 20 MG tablet Take 60 mg by mouth daily as needed (ED).     acetaminophen (TYLENOL) 500 MG  tablet Take 500 mg by mouth every 6 (six) hours as needed. (Patient not taking: Reported on 06/28/2022)     albuterol (VENTOLIN HFA) 108 (90 Base) MCG/ACT inhaler Inhale 2 puffs into the lungs every 6 (six) hours as needed for wheezing or shortness of breath. (Patient not taking: Reported on 06/28/2022) 8 g 2   No current facility-administered medications for this visit.   Facility-Administered Medications Ordered in Other Visits  Medication Dose Route Frequency Provider Last Rate Last Admin   iron sucrose (VENOFER) 300 mg in sodium chloride 0.9 % 250 mL IVPB  300 mg Intravenous Once Rushie Chestnut, PA-C 176.7 mL/hr at 03/23/23 1505 300 mg at 03/23/23 1505    PHYSICAL EXAMINATION: ECOG PERFORMANCE STATUS: 0 - Asymptomatic  BP 125/80   Pulse 72   Temp 98.1 F (36.7 C) (Tympanic)   Wt 185 lb (83.9 kg)   SpO2 100%   BMI 26.93 kg/m   Filed Weights   03/23/23 1343  Weight: 185 lb (83.9 kg)    Physical Exam Constitutional:      Appearance: He is not ill-appearing.  Cardiovascular:     Rate and Rhythm: Normal rate and regular rhythm.  Pulmonary:     Effort: No respiratory distress.     Breath sounds: Normal breath sounds. No wheezing.  Musculoskeletal:        General: No tenderness.  Skin:    General: Skin is warm.     Coloration: Skin is not pale.  Neurological:     Mental Status: He is alert and oriented to person, place, and time.  Psychiatric:        Mood and Affect: Mood and affect normal.        Behavior: Behavior normal.    LABORATORY DATA:  I have reviewed the data as listed Lab Results  Component Value Date   WBC 7.1 03/22/2023   NEUTROABS 4.6 03/22/2023   HGB 12.5 (L) 03/22/2023   HCT 39.3 03/22/2023   MCV 96.6 03/22/2023   PLT 566 (H) 03/22/2023      Latest Ref Rng & Units 03/22/2023    1:54 PM 12/15/2022    3:10 PM 09/23/2022    2:24 PM  CMP  Glucose 70 - 99 mg/dL 784  696  295   BUN 8 - 23 mg/dL 20  16  18    Creatinine 0.61 - 1.24 mg/dL 2.84  1.32   4.40   Sodium 135 - 145 mmol/L 134  132  136   Potassium 3.5 - 5.1 mmol/L 4.9  3.8  4.1   Chloride 98 - 111 mmol/L 103  101  102   CO2 22 - 32 mmol/L 25  23  25    Calcium  8.9 - 10.3 mg/dL 9.0  8.8  8.8   Total Protein 6.5 - 8.1 g/dL 6.9  7.1    Total Bilirubin 0.3 - 1.2 mg/dL 0.8  1.0    Alkaline Phos 38 - 126 U/L 76  64    AST 15 - 41 U/L 18  22    ALT 0 - 44 U/L 15  18       ASSESSMENT & PLAN:  Encounter Diagnoses  Name Primary?   Iron deficiency Yes   Essential thrombocythemia (HCC)    # 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 low clinical suspicion for any acute process. Complicated by his concurrent IDA. April 2023- Bone marrow Bx- s/o MPN; NEGATIVE for any fibrosis; or acute leukemia process. Initially platelet count improved with IV iron infusions. Now up a bit to 635. Looks like his platelets wax and wane from the 300s to 700s with average being 400s. Today Hgb is 12.5, MCV is 96.6, platelets are 566. Switching to a prenatal with iron to see if this has any effect on his labs. Continue hydrea for now at current dose.   # Iron deficient anemia- Likely secondary to his significant hemorrhoidal bleeding. He is s/p endoscopy, colonoscopy, and capsule study w/o clear etiology otherwise. Bleeding has stopped since seeing proctology however with his recent hip replacement his iron levels have fallen. He will switch his iron supplement from pm to am and will consider switching to a prenatal. We will supplement with an additional course of iron- increasing to venofer 300 mg weekly x 3 weeks total. We will see him back in 2 months for additional labs and consideration of venofer. At next visit will also screening for H.pylori if his iron deficiency continues despite cessation of rectal bleeding.      # DISPOSITION: Venofer 300 mg today Venofer 300 mg weekly x 3 weeks total (including today) RTC 2 months labs (CBC w/, CMP, iron, ferritin, H.pylori stool) RTC 2 months  plus 1-7 days APP visit +- Venofer 300 mg -[place]    Rushie Chestnut, PA-C 03/23/2023

## 2023-03-28 MED FILL — Iron Sucrose Inj 20 MG/ML (Fe Equiv): INTRAVENOUS | Qty: 15 | Status: AC

## 2023-03-29 ENCOUNTER — Inpatient Hospital Stay: Payer: BC Managed Care – PPO

## 2023-03-29 ENCOUNTER — Ambulatory Visit: Payer: BC Managed Care – PPO

## 2023-03-30 ENCOUNTER — Ambulatory Visit: Payer: BC Managed Care – PPO

## 2023-04-06 ENCOUNTER — Inpatient Hospital Stay: Payer: BC Managed Care – PPO

## 2023-04-06 VITALS — BP 115/76 | HR 66 | Temp 97.5°F | Resp 18

## 2023-04-06 DIAGNOSIS — D473 Essential (hemorrhagic) thrombocythemia: Secondary | ICD-10-CM | POA: Diagnosis not present

## 2023-04-06 DIAGNOSIS — D649 Anemia, unspecified: Secondary | ICD-10-CM

## 2023-04-06 MED ORDER — SODIUM CHLORIDE 0.9 % IV SOLN
300.0000 mg | Freq: Once | INTRAVENOUS | Status: AC
  Administered 2023-04-06: 300 mg via INTRAVENOUS
  Filled 2023-04-06: qty 300

## 2023-04-06 MED ORDER — SODIUM CHLORIDE 0.9 % IV SOLN
Freq: Once | INTRAVENOUS | Status: AC
  Filled 2023-04-06: qty 250

## 2023-04-12 ENCOUNTER — Inpatient Hospital Stay: Payer: BC Managed Care – PPO

## 2023-04-12 VITALS — BP 111/77 | HR 67 | Temp 97.8°F | Resp 18

## 2023-04-12 DIAGNOSIS — D649 Anemia, unspecified: Secondary | ICD-10-CM

## 2023-04-12 DIAGNOSIS — D473 Essential (hemorrhagic) thrombocythemia: Secondary | ICD-10-CM | POA: Diagnosis not present

## 2023-04-12 MED ORDER — SODIUM CHLORIDE 0.9 % IV SOLN
Freq: Once | INTRAVENOUS | Status: AC
  Filled 2023-04-12: qty 250

## 2023-04-12 MED ORDER — SODIUM CHLORIDE 0.9 % IV SOLN
300.0000 mg | Freq: Once | INTRAVENOUS | Status: AC
  Administered 2023-04-12: 300 mg via INTRAVENOUS
  Filled 2023-04-12: qty 300

## 2023-05-17 ENCOUNTER — Other Ambulatory Visit: Payer: Self-pay | Admitting: Internal Medicine

## 2023-05-17 DIAGNOSIS — D473 Essential (hemorrhagic) thrombocythemia: Secondary | ICD-10-CM

## 2023-05-23 ENCOUNTER — Other Ambulatory Visit: Payer: BC Managed Care – PPO

## 2023-05-30 ENCOUNTER — Ambulatory Visit: Payer: BC Managed Care – PPO | Admitting: Internal Medicine

## 2023-05-30 ENCOUNTER — Ambulatory Visit: Payer: BC Managed Care – PPO

## 2023-06-10 ENCOUNTER — Other Ambulatory Visit: Payer: Self-pay | Admitting: *Deleted

## 2023-06-10 DIAGNOSIS — D649 Anemia, unspecified: Secondary | ICD-10-CM

## 2023-06-13 ENCOUNTER — Inpatient Hospital Stay: Payer: BC Managed Care – PPO | Attending: Internal Medicine

## 2023-06-13 DIAGNOSIS — D473 Essential (hemorrhagic) thrombocythemia: Secondary | ICD-10-CM | POA: Diagnosis present

## 2023-06-13 DIAGNOSIS — M2550 Pain in unspecified joint: Secondary | ICD-10-CM | POA: Insufficient documentation

## 2023-06-13 DIAGNOSIS — R5383 Other fatigue: Secondary | ICD-10-CM | POA: Insufficient documentation

## 2023-06-13 DIAGNOSIS — D649 Anemia, unspecified: Secondary | ICD-10-CM

## 2023-06-13 DIAGNOSIS — D509 Iron deficiency anemia, unspecified: Secondary | ICD-10-CM | POA: Diagnosis present

## 2023-06-13 DIAGNOSIS — Z79899 Other long term (current) drug therapy: Secondary | ICD-10-CM | POA: Diagnosis not present

## 2023-06-13 LAB — CMP (CANCER CENTER ONLY)
ALT: 17 U/L (ref 0–44)
AST: 19 U/L (ref 15–41)
Albumin: 3.9 g/dL (ref 3.5–5.0)
Alkaline Phosphatase: 68 U/L (ref 38–126)
Anion gap: 6 (ref 5–15)
BUN: 16 mg/dL (ref 8–23)
CO2: 25 mmol/L (ref 22–32)
Calcium: 8.8 mg/dL — ABNORMAL LOW (ref 8.9–10.3)
Chloride: 102 mmol/L (ref 98–111)
Creatinine: 0.74 mg/dL (ref 0.61–1.24)
GFR, Estimated: >60 mL/min (ref >60)
Glucose, Bld: 101 mg/dL — ABNORMAL HIGH (ref 70–99)
Potassium: 4.1 mmol/L (ref 3.5–5.1)
Sodium: 133 mmol/L — ABNORMAL LOW (ref 135–145)
Total Bilirubin: 1 mg/dL (ref 0.3–1.2)
Total Protein: 7 g/dL (ref 6.5–8.1)

## 2023-06-13 LAB — CBC WITH DIFFERENTIAL (CANCER CENTER ONLY)
Abs Immature Granulocytes: 0.02 10*3/uL (ref 0.00–0.07)
Basophils Absolute: 0.1 10*3/uL (ref 0.0–0.1)
Basophils Relative: 2 %
Eosinophils Absolute: 0.4 10*3/uL (ref 0.0–0.5)
Eosinophils Relative: 5 %
HCT: 41.8 % (ref 39.0–52.0)
Hemoglobin: 13.6 g/dL (ref 13.0–17.0)
Immature Granulocytes: 0 %
Lymphocytes Relative: 18 %
Lymphs Abs: 1.5 10*3/uL (ref 0.7–4.0)
MCH: 32.2 pg (ref 26.0–34.0)
MCHC: 32.5 g/dL (ref 30.0–36.0)
MCV: 98.8 fL (ref 80.0–100.0)
Monocytes Absolute: 1 10*3/uL (ref 0.1–1.0)
Monocytes Relative: 12 %
Neutro Abs: 5.2 10*3/uL (ref 1.7–7.7)
Neutrophils Relative %: 63 %
Platelet Count: 650 10*3/uL — ABNORMAL HIGH (ref 150–400)
RBC: 4.23 MIL/uL (ref 4.22–5.81)
RDW: 16.9 % — ABNORMAL HIGH (ref 11.5–15.5)
WBC Count: 8.2 10*3/uL (ref 4.0–10.5)
nRBC: 0 % (ref 0.0–0.2)

## 2023-06-13 LAB — FERRITIN: Ferritin: 17 ng/mL — ABNORMAL LOW (ref 24–336)

## 2023-06-13 LAB — IRON AND TIBC
Iron: 193 ug/dL — ABNORMAL HIGH (ref 45–182)
Saturation Ratios: 45 % — ABNORMAL HIGH (ref 17.9–39.5)
TIBC: 433 ug/dL (ref 250–450)
UIBC: 240 ug/dL

## 2023-06-15 ENCOUNTER — Ambulatory Visit: Payer: BC Managed Care – PPO | Admitting: Internal Medicine

## 2023-06-15 ENCOUNTER — Ambulatory Visit: Payer: BC Managed Care – PPO

## 2023-06-16 ENCOUNTER — Inpatient Hospital Stay (HOSPITAL_BASED_OUTPATIENT_CLINIC_OR_DEPARTMENT_OTHER): Payer: BC Managed Care – PPO | Admitting: Nurse Practitioner

## 2023-06-16 ENCOUNTER — Inpatient Hospital Stay: Payer: BC Managed Care – PPO

## 2023-06-16 ENCOUNTER — Encounter: Payer: Self-pay | Admitting: Nurse Practitioner

## 2023-06-16 VITALS — BP 116/71 | HR 76

## 2023-06-16 VITALS — BP 112/74 | HR 78 | Temp 96.9°F | Resp 19 | Wt 178.2 lb

## 2023-06-16 DIAGNOSIS — D649 Anemia, unspecified: Secondary | ICD-10-CM

## 2023-06-16 DIAGNOSIS — Z79899 Other long term (current) drug therapy: Secondary | ICD-10-CM

## 2023-06-16 DIAGNOSIS — D473 Essential (hemorrhagic) thrombocythemia: Secondary | ICD-10-CM

## 2023-06-16 DIAGNOSIS — D500 Iron deficiency anemia secondary to blood loss (chronic): Secondary | ICD-10-CM

## 2023-06-16 MED ORDER — IRON SUCROSE 20 MG/ML IV SOLN
200.0000 mg | Freq: Once | INTRAVENOUS | Status: AC
  Administered 2023-06-16: 200 mg via INTRAVENOUS

## 2023-06-16 MED ORDER — SODIUM CHLORIDE 0.9% FLUSH
10.0000 mL | INTRAVENOUS | Status: DC | PRN
  Administered 2023-06-16: 10 mL via INTRAVENOUS
  Filled 2023-06-16: qty 10

## 2023-06-16 NOTE — Progress Notes (Signed)
Patient declined to wait the 30 minutes for post iron infusion observation today. Tolerated infusion well. VSS. 

## 2023-06-16 NOTE — Progress Notes (Signed)
Patient has concerns about his iron level. States the iron level went up but the ferritin is low.

## 2023-06-16 NOTE — Progress Notes (Signed)
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  Significant  dyspoiesis in the erythroid or granulocytic cell lines is not seen and  no increase in blastic cells identified.  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  There is also slight plasmacytosis with lack of large  aggregates or sheets.  Special stains to assess plasma cell clonality  and degree of fibrosis will be performed and the results reported in an  addendum.  Correlation with cytogenetic studies is recommended.   MICROSCOPIC DESCRIPTION:    PERIPHERAL BLOOD SMEAR: The red blood cells display mild to moderate  anisopoikilocytosis with microcytic cells, elliptocytes, teardrop cells,  target cells.  There is moderate polychromasia.  The white blood cells  are normal in number with scattered hypogranular neutrophils.  The  platelets are increased in number.   BONE MARROW  ASPIRATE: Bone marrow particles present  Erythroid precursors: Orderly and progressive maturation  Granulocytic precursors: Orderly and progressive maturation for the most  part.  No increase in blastic cells identified  Megakaryocytes: Increased in number with many large, atypical forms, and  small and/or hypolobated cells.  Lymphocytes/plasma cells: The plasma cells are slightly increased in  number representing 6% of all cells with lack of large aggregates or  sheets.  Large lymphoid aggregates are not seen.   TOUCH PREPARATIONS: Mixture of cell types present   CLOT AND BIOPSY: The sections show 60 to 70% cellularity with a mixture  of myeloid cell types but with prominent increase in megakaryocytes with  clustering.  Many of the megakaryocytes display abnormal morphology.  Occasional small interstitial and focally para-trabecular lymphoid  aggregates composed of small lymphoid cells are seen.   IRON STAIN: Iron stains are performed on a bone marrow aspirate or touch  imprint smear and section of clot. The controls stained appropriately.        Storage Iron: Present, scanty       Ring Sideroblasts: Absent   # # EGD- [Dr.Ganum; [place]]. On zantac.    # Left hip replacement ------------------------------------------------   Tuesday Jan 05, 2022      Essential thrombocythemia [facility])     INTERVAL HISTORY: Alone.  Ambulating independently. Unaccompanied.   Patient is very pleasant 67 year old male, with history of ET, currently on low dose hydrea and iron deficiency who returns to clinic for follow up. He has ongoing hip pain. Continues hydrea and reports compliance. He has bright red blood in his stool at times due to hemorrhoids. Denies recent infections. Has been busy with work but feels at baseline.    Review of Systems  Constitutional:  Positive for malaise/fatigue. Negative  for chills, diaphoresis, fever and weight loss.  HENT:  Negative for nosebleeds and sore throat.    Eyes:  Negative for double vision.  Respiratory:  Negative for cough, hemoptysis, sputum production, shortness of breath and wheezing.   Cardiovascular:  Negative for chest pain, palpitations, orthopnea and leg swelling.  Gastrointestinal:  Negative for abdominal pain, blood in stool, constipation, diarrhea, heartburn, melena, nausea and vomiting.  Genitourinary:  Negative for dysuria, frequency and urgency.  Musculoskeletal:  Positive for joint pain. Negative for back pain.  Skin: Negative.  Negative for itching and rash.  Neurological:  Negative for dizziness, tingling, focal weakness, weakness and headaches.  Endo/Heme/Allergies:  Does not bruise/bleed easily.  Psychiatric/Behavioral:  Negative for depression. The patient is not nervous/anxious and does not have insomnia.    PAST MEDICAL HISTORY :  Past Medical History:  Diagnosis Date   Arthritis    osteoarthritis -hips,spine,hands and left wrist.   Arthritis    Complication of anesthesia    GERD (gastroesophageal reflux disease)    Pneumonia    PONV (postoperative nausea and vomiting)    Sleep apnea    Thrombocytosis    Unspecified diseases of blood and blood-forming organs    "essential hyperthrombocytosis"    PAST SURGICAL HISTORY :   Past Surgical History:  Procedure Laterality Date   BACK SURGERY  June 2015   ROTATOR CUFF REPAIR     x3(lt x2), rt. x1   TOTAL HIP ARTHROPLASTY Left 02/11/2014   Procedure: LEFT TOTAL HIP ARTHROPLASTY ANTERIOR APPROACH;  Surgeon: Loanne Drilling, MD;  Location: [facility] ORS;  Service: Orthopedics;  Laterality: Left;   TOTAL HIP ARTHROPLASTY Right 03/11/2021   Procedure: TOTAL HIP ARTHROPLASTY ANTERIOR APPROACH;  Surgeon: Ollen Gross, MD;  Location: [facility] OR[facility];  Service: Orthopedics;  Laterality: Right;    FAMILY HISTORY :   Family History  Problem Relation Age of Onset   Cancer Maternal Aunt    Cancer Paternal Aunt    Stroke Maternal Grandfather     SOCIAL HISTORY:   Social History    Tobacco Use   Smoking status: Never   Smokeless tobacco: Never  Vaping Use   Vaping status: Never Used  Substance Use Topics   Alcohol use: Yes    Alcohol/week: 2.0 standard drinks of alcohol    Types: 2 Cans of beer per week    Comment: beer/ wine 2glasses  per night   Drug use: No    ALLERGIES:  is allergic to hornet venom, wasp venom protein, vicodin [hydrocodone-acetaminophen], other, and penicillins.  MEDICATIONS:  Current Outpatient Medications  Medication Sig Dispense Refill   aspirin EC 81 MG tablet Take 81 mg by mouth daily. Swallow whole.     cyclobenzaprine (FLEXERIL) 5 MG tablet Take 2 tablets (10 mg total) by mouth 3 (three) times daily as needed for muscle spasms. 40 tablet 0   EPINEPHrine 0.3 mg/0.3 mL IJ SOAJ injection Inject 0.3 mg into the muscle as needed for anaphylaxis.  0   famotidine (PEPCID) 10 MG tablet Take by mouth.     Fe Bisgly-Vit C-Vit B12-FA (GENTLE IRON PO) Take by mouth.     fluticasone (FLONASE) 50 MCG/ACT nasal spray Place 1 spray into both nostrils daily.     folic acid (FOLVITE) 1 MG tablet Take by mouth.     hydroxyurea (HYDREA) 500 MG capsule TAKE 1 CAPSULE (500 MG TOTAL) DAILY 30 capsule 2   loratadine (CLARITIN) 10 MG tablet Take 10 mg by mouth daily.  LUTEIN PO Take by mouth.     metaxalone (SKELAXIN) 800 MG tablet Take 800 mg by mouth 3 (three) times daily.     sildenafil (REVATIO) 20 MG tablet Take 60 mg by mouth daily as needed (ED).     acetaminophen (TYLENOL) 500 MG tablet Take 500 mg by mouth every 6 (six) hours as needed. (Patient not taking: Reported on 06/28/2022)     albuterol (VENTOLIN HFA) 108 (90 Base) MCG/ACT inhaler Inhale 2 puffs into the lungs every 6 (six) hours as needed for wheezing or shortness of breath. (Patient not taking: Reported on 06/28/2022) 8 g 2   No current facility-administered medications for this visit.    PHYSICAL EXAMINATION: ECOG PERFORMANCE STATUS: 0 - Asymptomatic  BP 112/74   Pulse 78    Temp (!) 96.9 F (36.1 C)   Resp 19   Wt 178 lb 3.2 oz (80.8 kg)   SpO2 98%   BMI 25.94 kg/m   Filed Weights   06/16/23 1345  Weight: 178 lb 3.2 oz (80.8 kg)   Physical Exam Vitals reviewed.  Constitutional:      Appearance: He is not ill-appearing.  Cardiovascular:     Rate and Rhythm: Normal rate and regular rhythm.  Pulmonary:     Effort: No respiratory distress.     Breath sounds: No wheezing.  Abdominal:     General: There is no distension.     Palpations: Abdomen is soft.     Tenderness: There is no abdominal tenderness. There is no guarding.  Musculoskeletal:        General: No tenderness.     Comments: Altered gait  Skin:    General: Skin is warm.     Coloration: Skin is not pale.  Neurological:     Mental Status: He is alert and oriented to person, place, and time.  Psychiatric:        Mood and Affect: Mood and affect normal.        Behavior: Behavior normal.    LABORATORY DATA:  I have reviewed the data as listed Lab Results  Component Value Date   WBC 8.2 06/13/2023   NEUTROABS 5.2 06/13/2023   HGB 13.6 06/13/2023   HCT 41.8 06/13/2023   MCV 98.8 06/13/2023   PLT 650 (H) 06/13/2023      Latest Ref Rng & Units 06/13/2023    1:56 PM 03/22/2023    1:54 PM 12/15/2022    3:10 PM  CMP  Glucose 70 - 99 mg/dL 829  562  130   BUN 8 - 23 mg/dL 16  20  16    Creatinine 0.61 - 1.24 mg/dL 8.65  7.84  6.96   Sodium 135 - 145 mmol/L 133  134  132   Potassium 3.5 - 5.1 mmol/L 4.1  4.9  3.8   Chloride 98 - 111 mmol/L 102  103  101   CO2 22 - 32 mmol/L 25  25  23    Calcium 8.9 - 10.3 mg/dL 8.8  9.0  8.8   Total Protein 6.5 - 8.1 g/dL 7.0  6.9  7.1   Total Bilirubin 0.3 - 1.2 mg/dL 1.0  0.8  1.0   Alkaline Phos 38 - 126 U/L 68  76  64   AST 15 - 41 U/L 19  18  22    ALT 0 - 44 U/L 17  15  18       ASSESSMENT & PLAN:   No problem-specific Assessment & Plan notes found for  this encounter.  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 April  2023- Bone marrow Bx- s/o MPN; NEGATIVE for any fibrosis; or acute leukemia process. Platelets 650 today. Recommend increasing hydrea to 2 pills (1000 mg) 3 days a week and 1 pill (500 mg) other days of week.   # Iron deficient anemia- etiology unclear. Possible hemorrhoidal bleeding. He is s/p endoscopy, colonoscopy, and capsule study w/o clear etiology otherwise. October 2022- ferritin 6, iron sat 3%. He is s/p venofer, last 06/09/22. Hemoglobin improved. Ferritin and iron studies pending at time of visit. Oct 23 ferritin was 14. Symptomatic. Proceed with venofer today. Additional doses based on iron studies. Ferritin 17, iron sat 45%    # Left Hip pain: s/p second opinion with Ortho. Unchanged.    # DISPOSITION: 6 week- lab (cbc, cmp, ferritin, iron), Dr Donneta Romberg- +/- venofer     Alinda Dooms, NP 06/16/2023

## 2023-07-25 ENCOUNTER — Inpatient Hospital Stay: Payer: BC Managed Care – PPO | Attending: Internal Medicine

## 2023-07-25 DIAGNOSIS — Z79899 Other long term (current) drug therapy: Secondary | ICD-10-CM | POA: Insufficient documentation

## 2023-07-25 DIAGNOSIS — D509 Iron deficiency anemia, unspecified: Secondary | ICD-10-CM | POA: Insufficient documentation

## 2023-07-25 DIAGNOSIS — D473 Essential (hemorrhagic) thrombocythemia: Secondary | ICD-10-CM | POA: Diagnosis present

## 2023-07-25 LAB — CBC WITH DIFFERENTIAL (CANCER CENTER ONLY)
Abs Immature Granulocytes: 0.03 10*3/uL (ref 0.00–0.07)
Basophils Absolute: 0.1 10*3/uL (ref 0.0–0.1)
Basophils Relative: 2 %
Eosinophils Absolute: 0.4 10*3/uL (ref 0.0–0.5)
Eosinophils Relative: 6 %
HCT: 39.1 % (ref 39.0–52.0)
Hemoglobin: 13 g/dL (ref 13.0–17.0)
Immature Granulocytes: 0 %
Lymphocytes Relative: 19 %
Lymphs Abs: 1.3 10*3/uL (ref 0.7–4.0)
MCH: 33.4 pg (ref 26.0–34.0)
MCHC: 33.2 g/dL (ref 30.0–36.0)
MCV: 100.5 fL — ABNORMAL HIGH (ref 80.0–100.0)
Monocytes Absolute: 1.1 10*3/uL — ABNORMAL HIGH (ref 0.1–1.0)
Monocytes Relative: 16 %
Neutro Abs: 4.1 10*3/uL (ref 1.7–7.7)
Neutrophils Relative %: 57 %
Platelet Count: 628 10*3/uL — ABNORMAL HIGH (ref 150–400)
RBC: 3.89 MIL/uL — ABNORMAL LOW (ref 4.22–5.81)
RDW: 14.1 % (ref 11.5–15.5)
WBC Count: 7.1 10*3/uL (ref 4.0–10.5)
nRBC: 0 % (ref 0.0–0.2)

## 2023-07-25 LAB — CMP (CANCER CENTER ONLY)
ALT: 16 U/L (ref 0–44)
AST: 19 U/L (ref 15–41)
Albumin: 3.5 g/dL (ref 3.5–5.0)
Alkaline Phosphatase: 54 U/L (ref 38–126)
Anion gap: 9 (ref 5–15)
BUN: 22 mg/dL (ref 8–23)
CO2: 24 mmol/L (ref 22–32)
Calcium: 8.8 mg/dL — ABNORMAL LOW (ref 8.9–10.3)
Chloride: 103 mmol/L (ref 98–111)
Creatinine: 0.85 mg/dL (ref 0.61–1.24)
GFR, Estimated: >60 mL/min (ref >60)
Glucose, Bld: 107 mg/dL — ABNORMAL HIGH (ref 70–99)
Potassium: 4.1 mmol/L (ref 3.5–5.1)
Sodium: 136 mmol/L (ref 135–145)
Total Bilirubin: 1 mg/dL (ref <1.2)
Total Protein: 6.5 g/dL (ref 6.5–8.1)

## 2023-07-25 LAB — IRON AND TIBC
Iron: 123 ug/dL (ref 45–182)
Saturation Ratios: 32 % (ref 17.9–39.5)
TIBC: 381 ug/dL (ref 250–450)
UIBC: 258 ug/dL

## 2023-07-25 LAB — FERRITIN: Ferritin: 20 ng/mL — ABNORMAL LOW (ref 24–336)

## 2023-07-29 ENCOUNTER — Encounter: Payer: Self-pay | Admitting: Internal Medicine

## 2023-07-29 ENCOUNTER — Inpatient Hospital Stay (HOSPITAL_BASED_OUTPATIENT_CLINIC_OR_DEPARTMENT_OTHER): Payer: BC Managed Care – PPO | Admitting: Internal Medicine

## 2023-07-29 ENCOUNTER — Inpatient Hospital Stay: Payer: BC Managed Care – PPO

## 2023-07-29 VITALS — BP 113/80 | HR 56 | Temp 98.2°F | Ht 69.5 in | Wt 181.6 lb

## 2023-07-29 VITALS — BP 116/82

## 2023-07-29 DIAGNOSIS — D500 Iron deficiency anemia secondary to blood loss (chronic): Secondary | ICD-10-CM

## 2023-07-29 DIAGNOSIS — D649 Anemia, unspecified: Secondary | ICD-10-CM

## 2023-07-29 DIAGNOSIS — D473 Essential (hemorrhagic) thrombocythemia: Secondary | ICD-10-CM

## 2023-07-29 MED ORDER — IRON SUCROSE 20 MG/ML IV SOLN
200.0000 mg | Freq: Once | INTRAVENOUS | Status: AC
  Administered 2023-07-29: 200 mg via INTRAVENOUS

## 2023-07-29 MED ORDER — HYDROXYUREA 500 MG PO CAPS: ORAL_CAPSULE | ORAL | 1 refills | Status: DC

## 2023-07-29 NOTE — Progress Notes (Signed)
Bruising: no Bleeding from gums: no  Being treated for kidney stone/infection.  Discuss labs, ferritin and iron.

## 2023-07-29 NOTE — Assessment & Plan Note (Addendum)
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 low clinical suspicion for any acute process.  April 2023- Bone marrow Bx- s/o MPN; NEGATIVE for any fibrosis; or acute leukemia process.  Platelets 600.  Monitor for now. Continue Hydrea 500 mg a day; T/T- 2 pills day.   # Iron deficient anemia-? etiology [DEC 2024 ferritin-20; iron sat 32%]- s/p IV iron infusion [pre-meds- Tylenol/benadryl]-hemoglobin today is 13.  Proceed with Venofer.  Continue/restart gentle iron q day.   # ETIOLOGY: The etiology is unclear-? hemorrhoidal bleeding S/p endoscopies; S/p capsule study- 2023- WNL-  S/p Dr.Cintron steroids supp.  # Left Hip pain: s/p second opinion with Ortho-overall stable.  # DISPOSITION: # venofer today; # follow up in 6  months- MD; labs- cbc/bmp; iron studies/ferritin--venofer-Dr.B

## 2023-07-29 NOTE — Patient Instructions (Signed)
Iron Sucrose Injection What is this medication? IRON SUCROSE (EYE ern SOO krose) treats low levels of iron (iron deficiency anemia) in people with kidney disease. Iron is a mineral that plays an important role in making red blood cells, which carry oxygen from your lungs to the rest of your body. This medicine may be used for other purposes; ask your health care provider or pharmacist if you have questions. COMMON BRAND NAME(S): Venofer What should I tell my care team before I take this medication? They need to know if you have any of these conditions: Anemia not caused by low iron levels Heart disease High levels of iron in the blood Kidney disease Liver disease An unusual or allergic reaction to iron, other medications, foods, dyes, or preservatives Pregnant or trying to get pregnant Breastfeeding How should I use this medication? This medication is for infusion into a vein. It is given in a hospital or clinic setting. Talk to your care team about the use of this medication in children. While this medication may be prescribed for children as young as 2 years for selected conditions, precautions do apply. Overdosage: If you think you have taken too much of this medicine contact a poison control center or emergency room at once. NOTE: This medicine is only for you. Do not share this medicine with others. What if I miss a dose? Keep appointments for follow-up doses. It is important not to miss your dose. Call your care team if you are unable to keep an appointment. What may interact with this medication? Do not take this medication with any of the following: Deferoxamine Dimercaprol Other iron products This medication may also interact with the following: Chloramphenicol Deferasirox This list may not describe all possible interactions. Give your health care provider a list of all the medicines, herbs, non-prescription drugs, or dietary supplements you use. Also tell them if you smoke,  drink alcohol, or use illegal drugs. Some items may interact with your medicine. What should I watch for while using this medication? Visit your care team regularly. Tell your care team if your symptoms do not start to get better or if they get worse. You may need blood work done while you are taking this medication. You may need to follow a special diet. Talk to your care team. Foods that contain iron include: whole grains/cereals, dried fruits, beans, or peas, leafy green vegetables, and organ meats (liver, kidney). What side effects may I notice from receiving this medication? Side effects that you should report to your care team as soon as possible: Allergic reactions--skin rash, itching, hives, swelling of the face, lips, tongue, or throat Low blood pressure--dizziness, feeling faint or lightheaded, blurry vision Shortness of breath Side effects that usually do not require medical attention (report to your care team if they continue or are bothersome): Flushing Headache Joint pain Muscle pain Nausea Pain, redness, or irritation at injection site This list may not describe all possible side effects. Call your doctor for medical advice about side effects. You may report side effects to FDA at 1-800-FDA-1088. Where should I keep my medication? This medication is given in a hospital or clinic. It will not be stored at home. NOTE: This sheet is a summary. It may not cover all possible information. If you have questions about this medicine, talk to your doctor, pharmacist, or health care provider.  2024 Elsevier/Gold Standard (2023-01-07 00:00:00)

## 2023-07-29 NOTE — Progress Notes (Signed)
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  Significant  dyspoiesis in the erythroid or granulocytic cell lines is not seen and  no increase in blastic cells identified.  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  There is also slight plasmacytosis with lack of large  aggregates or sheets.  Special stains to assess plasma cell clonality  and degree of fibrosis will be performed and the results reported in an  addendum.  Correlation with cytogenetic studies is recommended.   MICROSCOPIC DESCRIPTION:    PERIPHERAL BLOOD SMEAR: The red blood cells display mild to moderate  anisopoikilocytosis with microcytic cells, elliptocytes, teardrop cells,  target cells.  There is moderate polychromasia.  The white blood cells  are normal in number with scattered hypogranular neutrophils.  The  platelets are increased in number.   BONE  MARROW ASPIRATE: Bone marrow particles present  Erythroid precursors: Orderly and progressive maturation  Granulocytic precursors: Orderly and progressive maturation for the most  part.  No increase in blastic cells identified  Megakaryocytes: Increased in number with many large, atypical forms, and  small and/or hypolobated cells.  Lymphocytes/plasma cells: The plasma cells are slightly increased in  number representing 6% of all cells with lack of large aggregates or  sheets.  Large lymphoid aggregates are not seen.   TOUCH PREPARATIONS: Mixture of cell types present   CLOT AND BIOPSY: The sections show 60 to 70% cellularity with a mixture  of myeloid cell types but with prominent increase in megakaryocytes with  clustering.  Many of the megakaryocytes display abnormal morphology.  Occasional small interstitial and focally para-trabecular lymphoid  aggregates composed of small lymphoid cells are seen.   IRON STAIN: Iron stains are performed on a bone marrow aspirate or touch  imprint smear and section of clot. The controls stained appropriately.        Storage Iron: Present, scanty       Ring Sideroblasts: Absent   # # EGD- [Dr.Ganum; [place]]. On zantac.    # Left hip replacement ------------------------------------------------   Tuesday Jan 05, 2022      Essential thrombocythemia [facility])     INTERVAL HISTORY: Alone.  Ambulating dependently.  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  Noted to have recent kidney infection s/p anti-biotics- improved.   Patient continues to have intermittent blood in his stools- given hx of hemorrhoids.   Review of Systems  Constitutional:  Positive for malaise/fatigue. Negative for chills, diaphoresis, fever and weight loss.  HENT:  Negative  for nosebleeds and sore throat.   Eyes:  Negative for double vision.   Respiratory:  Negative for cough, hemoptysis, sputum production, shortness of breath and wheezing.   Cardiovascular:  Negative for chest pain, palpitations, orthopnea and leg swelling.  Gastrointestinal:  Negative for abdominal pain, blood in stool, constipation, diarrhea, heartburn, melena, nausea and vomiting.  Genitourinary:  Negative for dysuria, frequency and urgency.  Musculoskeletal:  Positive for joint pain. Negative for back pain.  Skin: Negative.  Negative for itching and rash.  Neurological:  Negative for dizziness, tingling, focal weakness, weakness and headaches.  Endo/Heme/Allergies:  Does not bruise/bleed easily.  Psychiatric/Behavioral:  Negative for depression. The patient is not nervous/anxious and does not have insomnia.       PAST MEDICAL HISTORY :  Past Medical History:  Diagnosis Date   Arthritis    osteoarthritis -hips,spine,hands and left wrist.   Arthritis    Complication of anesthesia    GERD (gastroesophageal reflux disease)    Pneumonia    PONV (postoperative nausea and vomiting)    Sleep apnea    Thrombocytosis    Unspecified diseases of blood and blood-forming organs    "essential hyperthrombocytosis"    PAST SURGICAL HISTORY :   Past Surgical History:  Procedure Laterality Date   BACK SURGERY  June 2015   ROTATOR CUFF REPAIR     x3(lt x2), rt. x1   TOTAL HIP ARTHROPLASTY Left 02/11/2014   Procedure: LEFT TOTAL HIP ARTHROPLASTY ANTERIOR APPROACH;  Surgeon: Loanne Drilling, MD;  Location: [facility];  Service: Orthopedics;  Laterality: Left;   TOTAL HIP ARTHROPLASTY Right 03/11/2021   Procedure: TOTAL HIP ARTHROPLASTY ANTERIOR APPROACH;  Surgeon: Ollen Gross, MD;  Location: [facility];  Service: Orthopedics;  Laterality: Right;    FAMILY HISTORY :   Family History  Problem Relation Age of Onset   Cancer Maternal Aunt    Cancer Paternal Aunt    Stroke Maternal Grandfather     SOCIAL HISTORY:   Social History   Tobacco Use   Smoking status:  Never   Smokeless tobacco: Never  Vaping Use   Vaping status: Never Used  Substance Use Topics   Alcohol use: Yes    Alcohol/week: 2.0 standard drinks of alcohol    Types: 2 Cans of beer per week    Comment: beer/ wine 2glasses  per night   Drug use: No    ALLERGIES:  is allergic to hornet venom, wasp venom protein, vicodin [hydrocodone-acetaminophen], other, and penicillins.  MEDICATIONS:  Current Outpatient Medications  Medication Sig Dispense Refill   aspirin EC 81 MG tablet Take 81 mg by mouth daily. Swallow whole.     ciprofloxacin (CIPRO) 500 MG tablet Take 500 mg by mouth 2 (two) times daily.     cyclobenzaprine (FLEXERIL) 5 MG tablet Take 2 tablets (10 mg total) by mouth 3 (three) times daily as needed for muscle spasms. 40 tablet 0   EPINEPHrine 0.3 mg/0.3 mL IJ SOAJ injection Inject 0.3 mg into the muscle as needed for anaphylaxis.  0   famotidine (PEPCID) 10 MG tablet Take by mouth.     Fe Bisgly-Vit C-Vit B12-FA (GENTLE IRON PO) Take by mouth.     fluticasone (FLONASE) 50 MCG/ACT nasal spray Place 1 spray into both nostrils daily.     folic acid (FOLVITE) 1 MG tablet Take by mouth.     loratadine (CLARITIN) 10 MG tablet Take 10 mg by mouth daily.     LUTEIN PO Take by mouth.  metaxalone (SKELAXIN) 800 MG tablet Take 800 mg by mouth 3 (three) times daily.     sildenafil (REVATIO) 20 MG tablet Take 60 mg by mouth daily as needed (ED).     acetaminophen (TYLENOL) 500 MG tablet Take 500 mg by mouth every 6 (six) hours as needed. (Patient not taking: Reported on 07/29/2023)     albuterol (VENTOLIN HFA) 108 (90 Base) MCG/ACT inhaler Inhale 2 puffs into the lungs every 6 (six) hours as needed for wheezing or shortness of breath. (Patient not taking: Reported on 07/29/2023) 8 g 2   hydroxyurea (HYDREA) 500 MG capsule Take one pill a day; On Tuesday/Thursday- take 1 extra at night. May take with food to minimize GI side effects. 120 capsule 1   No current facility-administered  medications for this visit.   Facility-Administered Medications Ordered in Other Visits  Medication Dose Route Frequency Provider Last Rate Last Admin   iron sucrose (VENOFER) injection 200 mg  200 mg Intravenous Once Alinda Dooms, NP        PHYSICAL EXAMINATION: ECOG PERFORMANCE STATUS: 0 - Asymptomatic  BP 113/80 (BP Location: Left Arm, Patient Position: Sitting, Cuff Size: Large)   Pulse (!) 56   Temp 98.2 F (36.8 C) (Tympanic)   Ht 5' 9.5" (1.765 m)   Wt 181 lb 9.6 oz (82.4 kg)   SpO2 97%   BMI 26.43 kg/m   Filed Weights   07/29/23 1429  Weight: 181 lb 9.6 oz (82.4 kg)     Physical Exam HENT:     Head: Normocephalic and atraumatic.     Mouth/Throat:     Pharynx: No oropharyngeal exudate.  Eyes:     Pupils: Pupils are equal, round, and reactive to light.  Cardiovascular:     Rate and Rhythm: Normal rate and regular rhythm.  Pulmonary:     Effort: No respiratory distress.     Breath sounds: No wheezing.  Abdominal:     General: Bowel sounds are normal. There is no distension.     Palpations: Abdomen is soft. There is no mass.     Tenderness: There is no abdominal tenderness. There is no guarding or rebound.  Musculoskeletal:        General: No tenderness. Normal range of motion.     Cervical back: Normal range of motion and neck supple.  Skin:    General: Skin is warm.  Neurological:     Mental Status: He is alert and oriented to person, place, and time.  Psychiatric:        Mood and Affect: Affect normal.    LABORATORY DATA:  I have reviewed the data as listed    Component Value Date/Time   NA 136 07/25/2023 1357   K 4.1 07/25/2023 1357   CL 103 07/25/2023 1357   CO2 24 07/25/2023 1357   GLUCOSE 107 (H) 07/25/2023 1357   BUN 22 07/25/2023 1357   CREATININE 0.85 07/25/2023 1357   CREATININE 0.86 02/28/2014 1450   CALCIUM 8.8 (L) 07/25/2023 1357   PROT 6.5 07/25/2023 1357   PROT 7.4 02/28/2014 1450   ALBUMIN 3.5 07/25/2023 1357   ALBUMIN 3.1  (L) 02/28/2014 1450   AST 19 07/25/2023 1357   ALT 16 07/25/2023 1357   ALT 40 02/28/2014 1450   ALKPHOS 54 07/25/2023 1357   ALKPHOS 109 02/28/2014 1450   BILITOT 1.0 07/25/2023 1357   GFRNONAA >60 07/25/2023 1357   GFRNONAA >60 02/28/2014 1450   GFRAA >60 05/12/2020 1423  GFRAA >60 02/28/2014 1450    No results found for: "SPEP", "UPEP"  Lab Results  Component Value Date   WBC 7.1 07/25/2023   NEUTROABS 4.1 07/25/2023   HGB 13.0 07/25/2023   HCT 39.1 07/25/2023   MCV 100.5 (H) 07/25/2023   PLT 628 (H) 07/25/2023      Chemistry      Component Value Date/Time   NA 136 07/25/2023 1357   K 4.1 07/25/2023 1357   CL 103 07/25/2023 1357   CO2 24 07/25/2023 1357   BUN 22 07/25/2023 1357   CREATININE 0.85 07/25/2023 1357   CREATININE 0.86 02/28/2014 1450      Component Value Date/Time   CALCIUM 8.8 (L) 07/25/2023 1357   ALKPHOS 54 07/25/2023 1357   ALKPHOS 109 02/28/2014 1450   AST 19 07/25/2023 1357   ALT 16 07/25/2023 1357   ALT 40 02/28/2014 1450   BILITOT 1.0 07/25/2023 1357      ASSESSMENT & PLAN:   Essential thrombocythemia (HCC) # 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 low clinical suspicion for any acute process.  April 2023- Bone marrow Bx- s/o MPN; NEGATIVE for any fibrosis; or acute leukemia process.  Platelets 600.  Monitor for now. Continue Hydrea 500 mg a day; T/T- 2 pills day.   # Iron deficient anemia-? etiology [DEC 2024 ferritin-20; iron sat 32%]- s/p IV iron infusion [pre-meds- Tylenol/benadryl]-hemoglobin today is 13.  Proceed with Venofer.  Continue/restart gentle iron q day.   # ETIOLOGY: The etiology is unclear-? hemorrhoidal bleeding S/p endoscopies; S/p capsule study- 2023- WNL-  S/p Dr.Cintron steroids supp.  # Left Hip pain: s/p second opinion with Ortho-overall stable.  # DISPOSITION: # venofer today; # follow up in 6  months- MD; labs- cbc/bmp; iron studies/ferritin--venofer-Dr.B     Earna Coder,  MD 07/29/2023 3:33 PM

## 2023-08-01 ENCOUNTER — Encounter: Payer: Self-pay | Admitting: Internal Medicine

## 2023-08-22 ENCOUNTER — Other Ambulatory Visit: Payer: Self-pay | Admitting: Internal Medicine

## 2023-08-22 DIAGNOSIS — D473 Essential (hemorrhagic) thrombocythemia: Secondary | ICD-10-CM

## 2023-08-22 NOTE — Telephone Encounter (Signed)
 CBC with Differential (Cancer Center Only) Order: [id number]  Status: Final result     Next appt: 02/06/2024 at 02:30 PM in Oncology ([facility])     Dx: Essential thrombocythemia (HCC)   Test Result Released: Yes (seen)   0 Result Notes          Component Ref Range & Units (hover) 4 wk ago (07/25/23) 2 mo ago (06/13/23) 5 mo ago (03/22/23) 7 mo ago (01/17/23) 7 mo ago (12/29/22) 8 mo ago (12/15/22) 11 mo ago (09/23/22)  WBC Count 7.1 8.2 7.1 6.2 6.4 7.2 8.4  RBC 3.89 Low  4.23 4.07 Low  4.09 Low  4.38 4.20 Low  4.24  Hemoglobin 13.0 13.6 12.5 Low  13.5 14.3 13.5 14.2  HCT 39.1 41.8 39.3 40.9 44.1 41.8 42.7  MCV 100.5 High  98.8 96.6 100.0 100.7 High  99.5 100.7 High   MCH 33.4 32.2 30.7 33.0 32.6 32.1 33.5  MCHC 33.2 32.5 31.8 33.0 32.4 32.3 33.3  RDW 14.1 16.9 High  14.1 15.0 14.6 14.2 14.1  Platelet Count 628 High  650 High  566 High  635 High  479 High  809 High  571 High   nRBC 0.0 0.0 0.0 0.0 0.0 0.0 0.0  Neutrophils Relative % 57 63 64 60 66 63 65  Neutro Abs 4.1 5.2 4.6 3.7 4.2 4.6 5.4  Lymphocytes Relative 19 18 18 23 19 20 20   Lymphs Abs 1.3 1.5 1.3 1.4 1.2 1.4 1.7  Monocytes Relative 16 12 13 11 11 11 12   Monocytes Absolute 1.1 High  1.0 0.9 0.7 0.7 0.8 1.0  Eosinophils Relative 6 5 3 3 2 3 2   Eosinophils Absolute 0.4 0.4 0.2 0.2 0.2 0.2 0.2  Basophils Relative 2 2 2 2 2 2 1   Basophils Absolute 0.1 0.1 0.1 0.1 0.1 0.2 High  0.1  Immature Granulocytes 0 0 0 1 0 1 0  Abs Immature Granulocytes 0.03 0.02 CM 0.02 CM 0.03 CM 0.01 CM 0.06 CM 0.02 CM  Comment: Performed at [facility], [address] [place], [place] [address]  Resulting Agency [facility]        Specimen Collected: 07/25/23 13:57 Last Resulted: 07/25/23 14:17  CMP (Cancer Center only) Order: [id number]  Status: Final result     Next appt: 02/06/2024 at 02:30 PM in Oncology ([facility])     Dx: Essential thrombocythemia  (HCC)   Test Result Released: Yes (seen)   0 Result Notes          Component Ref Range & Units (hover) 4 wk ago (07/25/23) 2 mo ago (06/13/23) 5 mo ago (03/22/23) 8 mo ago (12/15/22) 11 mo ago (09/23/22) 1 yr ago (06/09/22) 1 yr ago (02/08/22)  Sodium 136 133 Low  134 Low  132 Low  136 136 137  Potassium 4.1 4.1 4.9 3.8 4.1 4.0 4.0  Chloride 103 102 103 101 102 104 105  CO2 24 25 25 23 25 26 25   Glucose, Bld 107 High  101 High  CM 102 High  CM 100 High  CM 108 High  CM 101 High  CM 106 High  CM  Comment: Glucose reference range applies only to samples taken after fasting for at least 8 hours.  BUN 22 16 20 16 18 18 19   Creatinine 0.85 0.74 0.71 0.82 0.84 0.88 0.83  Calcium 8.8 Low  8.8  Low  9.0 8.8 Low  8.8 Low  8.9 8.7 Low   Total Protein 6.5 7.0 6.9 7.1     Albumin 3.5 3.9 3.7 3.9     AST 19 19 18 22      ALT 16 17 15 18      Alkaline Phosphatase 54 68 76 64     Total Bilirubin 1.0 1.0 R 0.8 R 1.0 R     GFR, Estimated >60 >60 CM >60 CM >60 CM     Comment: (NOTE) Calculated using the CKD-EPI Creatinine Equation (2021)  Anion gap 9 6 CM 6 CM 8 CM 9 CM 6 CM 7 CM  Comment: Performed at [facility], [address] [place], [place] [address]  Resulting Agency [facility]        Specimen Collected: 07/25/23 13:57 Last Resulted: 07/25/23 14:21

## 2023-08-25 ENCOUNTER — Other Ambulatory Visit: Payer: Self-pay

## 2023-08-25 ENCOUNTER — Encounter: Payer: Self-pay | Admitting: Internal Medicine

## 2023-08-25 DIAGNOSIS — D473 Essential (hemorrhagic) thrombocythemia: Secondary | ICD-10-CM

## 2023-08-25 MED ORDER — HYDROXYUREA 500 MG PO CAPS: ORAL_CAPSULE | ORAL | 1 refills | Status: DC

## 2023-08-31 ENCOUNTER — Other Ambulatory Visit: Payer: Self-pay

## 2023-08-31 ENCOUNTER — Telehealth: Payer: Self-pay | Admitting: *Deleted

## 2023-08-31 DIAGNOSIS — D473 Essential (hemorrhagic) thrombocythemia: Secondary | ICD-10-CM

## 2023-08-31 MED ORDER — HYDROXYUREA 500 MG PO CAPS: ORAL_CAPSULE | ORAL | 1 refills | Status: DC

## 2023-08-31 NOTE — Telephone Encounter (Signed)
 [facility] called needing clarification on the Hydrea  prescription sent. The new prescription has patient taking drug 5 days a week nothing on weekends, per doctor last office note, "ASSESSMENT & PLAN:    Essential thrombocythemia (HCC) # 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 low clinical suspicion for any acute process.  April 2023- Bone marrow Bx- s/o MPN; NEGATIVE for any fibrosis; or acute leukemia process.  Platelets 600.  Monitor for now. Continue Hydrea  500 mg a day; T/T- 2 pills day." They are requesting new prescription be sent and ALSO get a return call to let them know [phone] ref #[id number] CC.

## 2023-08-31 NOTE — Telephone Encounter (Signed)
 Resubmitted, added sat/Sunday doses, spoke with Howell Macintosh at [facility].

## 2024-02-06 ENCOUNTER — Inpatient Hospital Stay

## 2024-02-06 ENCOUNTER — Inpatient Hospital Stay: Payer: BC Managed Care – PPO | Admitting: Internal Medicine

## 2024-02-06 ENCOUNTER — Inpatient Hospital Stay: Attending: Internal Medicine

## 2024-02-06 ENCOUNTER — Inpatient Hospital Stay: Admitting: Nurse Practitioner

## 2024-02-06 ENCOUNTER — Inpatient Hospital Stay: Payer: BC Managed Care – PPO

## 2024-02-06 ENCOUNTER — Encounter: Payer: Self-pay | Admitting: Nurse Practitioner

## 2024-02-06 VITALS — BP 108/73 | HR 72 | Temp 97.6°F | Resp 16 | Wt 182.0 lb

## 2024-02-06 DIAGNOSIS — D473 Essential (hemorrhagic) thrombocythemia: Secondary | ICD-10-CM

## 2024-02-06 DIAGNOSIS — Z79899 Other long term (current) drug therapy: Secondary | ICD-10-CM

## 2024-02-06 LAB — BASIC METABOLIC PANEL WITH GFR
Anion gap: 9 (ref 5–15)
BUN: 27 mg/dL — ABNORMAL HIGH (ref 8–23)
CO2: 22 mmol/L (ref 22–32)
Calcium: 8.9 mg/dL (ref 8.9–10.3)
Chloride: 105 mmol/L (ref 98–111)
Creatinine, Ser: 0.85 mg/dL (ref 0.61–1.24)
GFR, Estimated: >60 mL/min (ref >60)
Glucose, Bld: 97 mg/dL (ref 70–99)
Potassium: 4 mmol/L (ref 3.5–5.1)
Sodium: 136 mmol/L (ref 135–145)

## 2024-02-06 LAB — CBC WITH DIFFERENTIAL (CANCER CENTER ONLY)
Abs Immature Granulocytes: 0.03 10*3/uL (ref 0.00–0.07)
Basophils Absolute: 0.1 10*3/uL (ref 0.0–0.1)
Basophils Relative: 2 %
Eosinophils Absolute: 0.3 10*3/uL (ref 0.0–0.5)
Eosinophils Relative: 4 %
HCT: 42.8 % (ref 39.0–52.0)
Hemoglobin: 14.2 g/dL (ref 13.0–17.0)
Immature Granulocytes: 1 %
Lymphocytes Relative: 19 %
Lymphs Abs: 1.3 10*3/uL (ref 0.7–4.0)
MCH: 32.9 pg (ref 26.0–34.0)
MCHC: 33.2 g/dL (ref 30.0–36.0)
MCV: 99.3 fL (ref 80.0–100.0)
Monocytes Absolute: 0.9 10*3/uL (ref 0.1–1.0)
Monocytes Relative: 14 %
Neutro Abs: 3.9 10*3/uL (ref 1.7–7.7)
Neutrophils Relative %: 60 %
Platelet Count: 499 10*3/uL — ABNORMAL HIGH (ref 150–400)
RBC: 4.31 MIL/uL (ref 4.22–5.81)
RDW: 13.2 % (ref 11.5–15.5)
WBC Count: 6.5 10*3/uL (ref 4.0–10.5)
nRBC: 0 % (ref 0.0–0.2)

## 2024-02-06 LAB — IRON AND TIBC
Iron: 121 ug/dL (ref 45–182)
Saturation Ratios: 27 % (ref 17.9–39.5)
TIBC: 456 ug/dL — ABNORMAL HIGH (ref 250–450)
UIBC: 335 ug/dL

## 2024-02-06 LAB — FERRITIN: Ferritin: 8 ng/mL — ABNORMAL LOW (ref 24–336)

## 2024-02-06 NOTE — Progress Notes (Unsigned)
 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  Significant  dyspoiesis in the erythroid or granulocytic cell lines is not seen and  no increase in blastic cells identified.  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  There is also slight plasmacytosis with lack of large  aggregates or sheets.  Special stains to assess plasma cell clonality  and degree of fibrosis will be performed and the results reported in an  addendum.  Correlation with cytogenetic studies is recommended.   MICROSCOPIC DESCRIPTION:    PERIPHERAL BLOOD SMEAR: The red blood cells display mild to moderate  anisopoikilocytosis with microcytic cells, elliptocytes, teardrop cells,  target cells.  There is moderate polychromasia.  The white blood cells  are normal in number with scattered hypogranular neutrophils.  The  platelets are increased in number.   BONE MARROW  ASPIRATE: Bone marrow particles present  Erythroid precursors: Orderly and progressive maturation  Granulocytic precursors: Orderly and progressive maturation for the most  part.  No increase in blastic cells identified  Megakaryocytes: Increased in number with many large, atypical forms, and  small and/or hypolobated cells.  Lymphocytes/plasma cells: The plasma cells are slightly increased in  number representing 6% of all cells with lack of large aggregates or  sheets.  Large lymphoid aggregates are not seen.   TOUCH PREPARATIONS: Mixture of cell types present   CLOT AND BIOPSY: The sections show 60 to 70% cellularity with a mixture  of myeloid cell types but with prominent increase in megakaryocytes with  clustering.  Many of the megakaryocytes display abnormal morphology.  Occasional small interstitial and focally para-trabecular lymphoid  aggregates composed of small lymphoid cells are seen.   IRON  STAIN: Iron  stains are performed on a bone marrow aspirate or touch  imprint smear and section of clot. The controls stained appropriately.        Storage Iron : Present, scanty       Ring Sideroblasts: Absent   # # EGD- [Dr.Ganum; [place]]. On zantac.    # Left hip replacement ------------------------------------------------   Tuesday Jan 05, 2022      Essential thrombocythemia [facility])    INTERVAL HISTORY: Alone.  Ambulating dependently.  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  Noted to have recent kidney infection s/p anti-biotics- improved.   Patient continues to have intermittent blood in his stools- given hx of hemorrhoids.   Review of Systems  Constitutional:  Positive for malaise/fatigue. Negative for chills, diaphoresis, fever and weight loss.  HENT:  Negative for nosebleeds  and sore throat.   Eyes:  Negative for double vision.   Respiratory:  Negative for cough, hemoptysis, sputum production, shortness of breath and wheezing.   Cardiovascular:  Negative for chest pain, palpitations, orthopnea and leg swelling.  Gastrointestinal:  Negative for abdominal pain, blood in stool, constipation, diarrhea, heartburn, melena, nausea and vomiting.  Genitourinary:  Negative for dysuria, frequency and urgency.  Musculoskeletal:  Positive for joint pain. Negative for back pain.  Skin: Negative.  Negative for itching and rash.  Neurological:  Negative for dizziness, tingling, focal weakness, weakness and headaches.  Endo/Heme/Allergies:  Does not bruise/bleed easily.  Psychiatric/Behavioral:  Negative for depression. The patient is not nervous/anxious and does not have insomnia.       PAST MEDICAL HISTORY :  Past Medical History:  Diagnosis Date   Arthritis    osteoarthritis -hips,spine,hands and left wrist.   Arthritis    Complication of anesthesia    GERD (gastroesophageal reflux disease)    Pneumonia    PONV (postoperative nausea and vomiting)    Sleep apnea    Thrombocytosis    Unspecified diseases of blood and blood-forming organs    essential hyperthrombocytosis    PAST SURGICAL HISTORY :   Past Surgical History:  Procedure Laterality Date   BACK SURGERY  June 2015   ROTATOR CUFF REPAIR     x3(lt x2), rt. x1   TOTAL HIP ARTHROPLASTY Left 02/11/2014   Procedure: LEFT TOTAL HIP ARTHROPLASTY ANTERIOR APPROACH;  Surgeon: Dempsey Melodi GAILS, MD;  Location: [facility];  Service: Orthopedics;  Laterality: Left;   TOTAL HIP ARTHROPLASTY Right 03/11/2021   Procedure: TOTAL HIP ARTHROPLASTY ANTERIOR APPROACH;  Surgeon: Melodi Dempsey, MD;  Location: [facility];  Service: Orthopedics;  Laterality: Right;    FAMILY HISTORY :   Family History  Problem Relation Age of Onset   Cancer Maternal Aunt    Cancer Paternal Aunt    Stroke Maternal Grandfather     SOCIAL HISTORY:   Social History   Tobacco Use   Smoking status:  Never   Smokeless tobacco: Never  Vaping Use   Vaping status: Never Used  Substance Use Topics   Alcohol use: Yes    Alcohol/week: 2.0 standard drinks of alcohol    Types: 2 Cans of beer per week    Comment: beer/ wine 2glasses  per night   Drug use: No    ALLERGIES:  is allergic to hornet venom, wasp venom protein, vicodin [hydrocodone-acetaminophen ], other, and penicillins.  MEDICATIONS:  Current Outpatient Medications  Medication Sig Dispense Refill   aspirin EC 81 MG tablet Take 81 mg by mouth daily. Swallow whole.     cyclobenzaprine  (FLEXERIL ) 5 MG tablet Take 2 tablets (10 mg total) by mouth 3 (three) times daily as needed for muscle spasms. 40 tablet 0   EPINEPHrine  0.3 mg/0.3 mL IJ SOAJ injection Inject 0.3 mg into the muscle as needed for anaphylaxis.  0   famotidine  (PEPCID ) 10 MG tablet Take by mouth.     Fe Bisgly-Vit C-Vit B12-FA (GENTLE IRON  PO) Take by mouth.     fluticasone  (FLONASE ) 50 MCG/ACT nasal spray Place 1 spray into both nostrils daily.     hydroxyurea  (HYDREA ) 500 MG capsule TAKE 1 CAPSULE (500 MG TOTAL) DAILY MONDAY, WEDNESDAY, FRIDAY, SATURDAY AND SUNDAY, TAKE 2 PILLS A DAY ON TUESDAY AND THURSDAY 114 capsule 1   loratadine (CLARITIN) 10 MG tablet Take 10 mg by mouth daily.     LUTEIN PO Take by mouth.  metaxalone  (SKELAXIN ) 800 MG tablet Take 800 mg by mouth 3 (three) times daily.     sildenafil  (REVATIO ) 20 MG tablet Take 60 mg by mouth daily as needed (ED).     tamsulosin (FLOMAX) 0.4 MG CAPS capsule TAKE 1 CAPSULE(0.4 MG) BY MOUTH DAILY 30 MINUTES AFTER THE SAME MEAL     acetaminophen  (TYLENOL ) 500 MG tablet Take 500 mg by mouth every 6 (six) hours as needed. (Patient not taking: Reported on 02/06/2024)     albuterol  (VENTOLIN  HFA) 108 (90 Base) MCG/ACT inhaler Inhale 2 puffs into the lungs every 6 (six) hours as needed for wheezing or shortness of breath. (Patient not taking: Reported on 02/06/2024) 8 g 2   folic acid (FOLVITE) 1 MG tablet Take by  mouth. (Patient not taking: Reported on 02/06/2024)     No current facility-administered medications for this visit.    PHYSICAL EXAMINATION: ECOG PERFORMANCE STATUS: 0 - Asymptomatic  BP 108/73 (BP Location: Left Arm, Patient Position: Sitting, Cuff Size: Normal)   Pulse 72   Temp 97.6 F (36.4 C) (Tympanic)   Resp 16   Wt 182 lb (82.6 kg)   SpO2 97%   BMI 26.49 kg/m   Filed Weights   02/06/24 1046  Weight: 182 lb (82.6 kg)     Physical Exam HENT:     Head: Normocephalic and atraumatic.     Mouth/Throat:     Pharynx: No oropharyngeal exudate.   Eyes:     Pupils: Pupils are equal, round, and reactive to light.    Cardiovascular:     Rate and Rhythm: Normal rate and regular rhythm.  Pulmonary:     Effort: No respiratory distress.     Breath sounds: No wheezing.  Abdominal:     General: Bowel sounds are normal. There is no distension.     Palpations: Abdomen is soft. There is no mass.     Tenderness: There is no abdominal tenderness. There is no guarding or rebound.   Musculoskeletal:        General: No tenderness. Normal range of motion.     Cervical back: Normal range of motion and neck supple.   Skin:    General: Skin is warm.   Neurological:     Mental Status: He is alert and oriented to person, place, and time.   Psychiatric:        Mood and Affect: Affect normal.    LABORATORY DATA:  I have reviewed the data as listed    Component Value Date/Time   NA 136 02/06/2024 1038   K 4.0 02/06/2024 1038   CL 105 02/06/2024 1038   CO2 22 02/06/2024 1038   GLUCOSE 97 02/06/2024 1038   BUN 27 (H) 02/06/2024 1038   CREATININE 0.85 02/06/2024 1038   CREATININE 0.85 07/25/2023 1357   CREATININE 0.86 02/28/2014 1450   CALCIUM 8.9 02/06/2024 1038   PROT 6.5 07/25/2023 1357   PROT 7.4 02/28/2014 1450   ALBUMIN 3.5 07/25/2023 1357   ALBUMIN 3.1 (L) 02/28/2014 1450   AST 19 07/25/2023 1357   ALT 16 07/25/2023 1357   ALT 40 02/28/2014 1450   ALKPHOS 54  07/25/2023 1357   ALKPHOS 109 02/28/2014 1450   BILITOT 1.0 07/25/2023 1357   GFRNONAA >60 02/06/2024 1038   GFRNONAA >60 07/25/2023 1357   GFRNONAA >60 02/28/2014 1450   GFRAA >60 05/12/2020 1423   GFRAA >60 02/28/2014 1450    No results found for: SPEP, UPEP  Lab Results  Component  Value Date   WBC 6.5 02/06/2024   NEUTROABS 3.9 02/06/2024   HGB 14.2 02/06/2024   HCT 42.8 02/06/2024   MCV 99.3 02/06/2024   PLT 499 (H) 02/06/2024      Chemistry      Component Value Date/Time   NA 136 02/06/2024 1038   K 4.0 02/06/2024 1038   CL 105 02/06/2024 1038   CO2 22 02/06/2024 1038   BUN 27 (H) 02/06/2024 1038   CREATININE 0.85 02/06/2024 1038   CREATININE 0.85 07/25/2023 1357   CREATININE 0.86 02/28/2014 1450      Component Value Date/Time   CALCIUM 8.9 02/06/2024 1038   ALKPHOS 54 07/25/2023 1357   ALKPHOS 109 02/28/2014 1450   AST 19 07/25/2023 1357   ALT 16 07/25/2023 1357   ALT 40 02/28/2014 1450   BILITOT 1.0 07/25/2023 1357      ASSESSMENT & PLAN:   # 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 low clinical suspicion for any acute process.  April 2023- Bone marrow Bx- s/o MPN; NEGATIVE for any fibrosis; or acute leukemia process.  Platelets 600.  Monitor for now. Continue Hydrea  500 mg a day; T/T- 2 pills day.    # Iron  deficient anemia-? etiology [DEC 2024 ferritin-20; iron  sat 32%]- s/p IV iron  infusion [pre-meds- Tylenol /benadryl ]-hemoglobin today is 13.  Proceed with Venofer .  Continue/restart gentle iron  q day.    # ETIOLOGY: The etiology is unclear-? hemorrhoidal bleeding S/p endoscopies; S/p capsule study- 2023- WNL-  S/p Dr.Cintron steroids supp.   # Left Hip pain: s/p second opinion with Ortho-overall stable.   # DISPOSITION: # venofer  today; # follow up in 6  months- MD; labs- cbc/bmp; iron  studies/ferritin--venofer -Dr.B  No problem-specific Assessment & Plan notes found for this encounter.      Tinnie KANDICE Dawn, NP 02/06/2024  11:43 AM

## 2024-02-07 ENCOUNTER — Other Ambulatory Visit: Payer: Self-pay | Admitting: Internal Medicine

## 2024-02-07 DIAGNOSIS — D473 Essential (hemorrhagic) thrombocythemia: Secondary | ICD-10-CM

## 2024-02-24 ENCOUNTER — Ambulatory Visit: Payer: Self-pay | Admitting: Nurse Practitioner

## 2024-02-24 NOTE — Progress Notes (Signed)
 Left patient a voicemail to call to schedule appointments

## 2024-02-29 NOTE — Progress Notes (Signed)
Left another voicemail for patient to call to schedule.

## 2024-06-07 ENCOUNTER — Encounter: Payer: Self-pay | Admitting: Adult Health

## 2024-06-07 ENCOUNTER — Ambulatory Visit (INDEPENDENT_AMBULATORY_CARE_PROVIDER_SITE_OTHER): Payer: Self-pay | Admitting: Adult Health

## 2024-06-07 ENCOUNTER — Other Ambulatory Visit: Payer: Self-pay

## 2024-06-07 VITALS — BP 110/80 | HR 84 | Temp 98.4°F | Ht 69.5 in | Wt 180.0 lb

## 2024-06-07 DIAGNOSIS — J0110 Acute frontal sinusitis, unspecified: Secondary | ICD-10-CM

## 2024-06-07 MED ORDER — CEPHALEXIN 500 MG PO CAPS: 500.0000 mg | ORAL_CAPSULE | Freq: Two times a day (BID) | ORAL | 0 refills | Status: AC

## 2024-06-07 NOTE — Progress Notes (Signed)
 [facility] [address] [place], [place] [address]   Office Visit Note  Patient Name: Joshua Cabrera Date of Birth [id number]  Medical Record number [id number]  Date of Service: 06/07/2024  Chief Complaint  Patient presents with   Sick visit    Patient c/o hoarseness in his voice, head and chest congestion, and cough with yellow-green mucus production since returning from [place] and [place] 5 days ago. He has been taking Dayquil and Nyquil. He took a COVID test at home 3 days ago which was negative.     HPI Pt is here for a sick visit. He reports 5 days ago he was in [place] and started getting sick.  He describes PND and cough. Three days ago he became worse, took home Covid test that was negative. He feels worse than yesterday.  He reports congestion, sinus pressure, yellow green mucous, and feels heaviness in chest.  He took Dayquil with some temp relief.    Current Medication:  Outpatient Encounter Medications as of 06/07/2024  Medication Sig   acetaminophen  (TYLENOL ) 500 MG tablet Take 500 mg by mouth every 6 (six) hours as needed.   albuterol  (VENTOLIN  HFA) 108 (90 Base) MCG/ACT inhaler Inhale 2 puffs into the lungs every 6 (six) hours as needed for wheezing or shortness of breath.   aspirin EC 81 MG tablet Take 81 mg by mouth daily. Swallow whole.   cephALEXin (KEFLEX) 500 MG capsule Take 1 capsule (500 mg total) by mouth 2 (two) times daily.   cyclobenzaprine  (FLEXERIL ) 5 MG tablet Take 2 tablets (10 mg total) by mouth 3 (three) times daily as needed for muscle spasms.   EPINEPHrine  0.3 mg/0.3 mL IJ SOAJ injection Inject 0.3 mg into the muscle as needed for anaphylaxis.   famotidine  (PEPCID ) 10 MG tablet Take by mouth.   Fe Bisgly-Vit C-Vit B12-FA (GENTLE IRON  PO) Take by mouth.   fluticasone  (FLONASE ) 50 MCG/ACT nasal spray Place 1 spray into both nostrils daily.   hydroxyurea  (HYDREA ) 500 MG capsule TAKE 1 CAPSULE DAILY ON MONDAY, WEDNESDAY, FRIDAY, SATURDAY AND  SUNDAY AND 2 CAPSULES DAILY ON TUESDAY AND THURSDAY   loratadine (CLARITIN) 10 MG tablet Take 10 mg by mouth daily. (Patient taking differently: Take 10 mg by mouth daily as needed.)   metaxalone  (SKELAXIN ) 800 MG tablet Take 800 mg by mouth 3 (three) times daily.   sildenafil  (REVATIO ) 20 MG tablet Take 60 mg by mouth daily as needed (ED).   tamsulosin (FLOMAX) 0.4 MG CAPS capsule TAKE 1 CAPSULE(0.4 MG) BY MOUTH DAILY 30 MINUTES AFTER THE SAME MEAL   [DISCONTINUED] folic acid (FOLVITE) 1 MG tablet Take by mouth. (Patient not taking: Reported on 02/06/2024)   [DISCONTINUED] LUTEIN PO Take by mouth.   No facility-administered encounter medications on file as of 06/07/2024.      Medical History: Past Medical History:  Diagnosis Date   Arthritis    osteoarthritis -hips,spine,hands and left wrist.   Arthritis    Complication of anesthesia    GERD (gastroesophageal reflux disease)    Pneumonia    PONV (postoperative nausea and vomiting)    Sleep apnea    Thrombocytosis    Unspecified diseases of blood and blood-forming organs    essential hyperthrombocytosis     Vital Signs: BP 110/80   Pulse 84   Temp 98.4 F (36.9 C)   Ht 5' 9.5 (1.765 m)   Wt 180 lb (81.6 kg)   SpO2 94%   BMI 26.20 kg/m  Review of Systems  Constitutional:  Negative for chills, fatigue and fever.  HENT:  Positive for congestion, postnasal drip, sinus pressure and sore throat.   Eyes:  Negative for pain and itching.  Respiratory:  Positive for cough.   Cardiovascular:  Negative for chest pain.  Gastrointestinal:  Negative for diarrhea, nausea and vomiting.    Physical Exam Vitals reviewed.  Constitutional:      Appearance: Normal appearance.  HENT:     Head: Normocephalic.     Right Ear: Tympanic membrane and ear canal normal.     Left Ear: Tympanic membrane and ear canal normal.     Nose: Nose normal.     Mouth/Throat:     Mouth: Mucous membranes are moist.     Pharynx: No posterior  oropharyngeal erythema.  Eyes:     Pupils: Pupils are equal, round, and reactive to light.  Cardiovascular:     Rate and Rhythm: Normal rate.  Pulmonary:     Effort: Pulmonary effort is normal.     Breath sounds: Normal breath sounds.  Lymphadenopathy:     Cervical: No cervical adenopathy.  Neurological:     Mental Status: He is alert.    Assessment/Plan: 1. Acute non-recurrent frontal sinusitis (Primary) Patient Instructions: -Take complete course of antibiotics as prescribed.  Take with food.  -Try Flonase /Fluticasone  nasal spray, 2 sprays to each nostril once a day. -You can try using a neti pot or nasal saline rinse product to help clear mucus congestion. -Rest and stay well hydrated (by drinking water  and other liquids). Avoid/limit caffeine. -Take over-the-counter medicines (i.e. Mucinex, decongestant, Ibuprofen or Tylenol , cough suppressant) to help relieve your symptoms. -For your cough, use cough drops/throat lozenges, gargle warm salt water  and/or drink warm liquids (like tea with honey). -Send my chart message to provider or schedule return visit as needed for new/worsening symptoms or if symptoms do not improve as discussed with antibiotic and other recommended treatment.   - cephALEXin (KEFLEX) 500 MG capsule; Take 1 capsule (500 mg total) by mouth 2 (two) times daily.  Dispense: 20 capsule; Refill: 0     General Counseling: Daton verbalizes understanding of the findings of todays visit and agrees with plan of treatment. I have discussed any further diagnostic evaluation that may be needed or ordered today. We also reviewed his medications today. he has been encouraged to call the office with any questions or concerns that should arise related to todays visit.   No orders of the defined types were placed in this encounter.   Meds ordered this encounter  Medications   cephALEXin (KEFLEX) 500 MG capsule    Sig: Take 1 capsule (500 mg total) by mouth 2 (two) times daily.     Dispense:  20 capsule    Refill:  0    Time spent:15 Minutes    Juliene DOROTHA Howells AGNP-C Nurse Practitioner

## 2024-06-08 ENCOUNTER — Other Ambulatory Visit: Payer: Self-pay | Admitting: Adult Health

## 2024-06-08 ENCOUNTER — Encounter: Payer: Self-pay | Admitting: Adult Health

## 2024-06-08 DIAGNOSIS — R051 Acute cough: Secondary | ICD-10-CM

## 2024-06-08 MED ORDER — ALBUTEROL SULFATE HFA 108 (90 BASE) MCG/ACT IN AERS: INHALATION_SPRAY | RESPIRATORY_TRACT | 2 refills | Status: AC

## 2024-07-16 ENCOUNTER — Inpatient Hospital Stay

## 2024-07-16 ENCOUNTER — Inpatient Hospital Stay: Admitting: Internal Medicine

## 2024-07-24 ENCOUNTER — Other Ambulatory Visit: Payer: Self-pay | Admitting: Internal Medicine

## 2024-07-24 DIAGNOSIS — D473 Essential (hemorrhagic) thrombocythemia: Secondary | ICD-10-CM

## 2024-08-01 ENCOUNTER — Encounter: Payer: Self-pay | Admitting: Internal Medicine

## 2024-08-01 ENCOUNTER — Inpatient Hospital Stay: Admitting: Internal Medicine

## 2024-08-01 ENCOUNTER — Inpatient Hospital Stay: Attending: Internal Medicine

## 2024-08-01 VITALS — BP 123/71 | HR 76 | Temp 98.3°F | Resp 18 | Ht 69.5 in | Wt 177.3 lb

## 2024-08-01 DIAGNOSIS — Z7982 Long term (current) use of aspirin: Secondary | ICD-10-CM | POA: Insufficient documentation

## 2024-08-01 DIAGNOSIS — Z7951 Long term (current) use of inhaled steroids: Secondary | ICD-10-CM | POA: Diagnosis not present

## 2024-08-01 DIAGNOSIS — M19042 Primary osteoarthritis, left hand: Secondary | ICD-10-CM | POA: Insufficient documentation

## 2024-08-01 DIAGNOSIS — D509 Iron deficiency anemia, unspecified: Secondary | ICD-10-CM | POA: Insufficient documentation

## 2024-08-01 DIAGNOSIS — D473 Essential (hemorrhagic) thrombocythemia: Secondary | ICD-10-CM | POA: Insufficient documentation

## 2024-08-01 DIAGNOSIS — Z885 Allergy status to narcotic agent status: Secondary | ICD-10-CM | POA: Insufficient documentation

## 2024-08-01 DIAGNOSIS — G4730 Sleep apnea, unspecified: Secondary | ICD-10-CM | POA: Insufficient documentation

## 2024-08-01 DIAGNOSIS — M160 Bilateral primary osteoarthritis of hip: Secondary | ICD-10-CM | POA: Diagnosis not present

## 2024-08-01 DIAGNOSIS — Z809 Family history of malignant neoplasm, unspecified: Secondary | ICD-10-CM | POA: Insufficient documentation

## 2024-08-01 DIAGNOSIS — B370 Candidal stomatitis: Secondary | ICD-10-CM | POA: Insufficient documentation

## 2024-08-01 DIAGNOSIS — M19041 Primary osteoarthritis, right hand: Secondary | ICD-10-CM | POA: Diagnosis not present

## 2024-08-01 DIAGNOSIS — Z79899 Other long term (current) drug therapy: Secondary | ICD-10-CM | POA: Insufficient documentation

## 2024-08-01 DIAGNOSIS — K649 Unspecified hemorrhoids: Secondary | ICD-10-CM | POA: Diagnosis not present

## 2024-08-01 DIAGNOSIS — J069 Acute upper respiratory infection, unspecified: Secondary | ICD-10-CM | POA: Insufficient documentation

## 2024-08-01 DIAGNOSIS — Z880 Allergy status to penicillin: Secondary | ICD-10-CM | POA: Diagnosis not present

## 2024-08-01 DIAGNOSIS — Z8701 Personal history of pneumonia (recurrent): Secondary | ICD-10-CM | POA: Insufficient documentation

## 2024-08-01 DIAGNOSIS — Z8719 Personal history of other diseases of the digestive system: Secondary | ICD-10-CM | POA: Insufficient documentation

## 2024-08-01 DIAGNOSIS — M2550 Pain in unspecified joint: Secondary | ICD-10-CM | POA: Insufficient documentation

## 2024-08-01 DIAGNOSIS — M19032 Primary osteoarthritis, left wrist: Secondary | ICD-10-CM | POA: Insufficient documentation

## 2024-08-01 DIAGNOSIS — R5383 Other fatigue: Secondary | ICD-10-CM | POA: Insufficient documentation

## 2024-08-01 DIAGNOSIS — Z823 Family history of stroke: Secondary | ICD-10-CM | POA: Diagnosis not present

## 2024-08-01 LAB — BASIC METABOLIC PANEL WITH GFR
Anion gap: 11 (ref 5–15)
BUN: 20 mg/dL (ref 8–23)
CO2: 23 mmol/L (ref 22–32)
Calcium: 9.3 mg/dL (ref 8.9–10.3)
Chloride: 103 mmol/L (ref 98–111)
Creatinine, Ser: 0.91 mg/dL (ref 0.61–1.24)
GFR, Estimated: >60 mL/min (ref >60)
Glucose, Bld: 125 mg/dL — ABNORMAL HIGH (ref 70–99)
Potassium: 4.1 mmol/L (ref 3.5–5.1)
Sodium: 136 mmol/L (ref 135–145)

## 2024-08-01 LAB — CBC WITH DIFFERENTIAL (CANCER CENTER ONLY)
Abs Immature Granulocytes: 0.04 K/uL (ref 0.00–0.07)
Basophils Absolute: 0.1 K/uL (ref 0.0–0.1)
Basophils Relative: 2 %
Eosinophils Absolute: 0.3 K/uL (ref 0.0–0.5)
Eosinophils Relative: 4 %
HCT: 43.8 % (ref 39.0–52.0)
Hemoglobin: 14.6 g/dL (ref 13.0–17.0)
Immature Granulocytes: 1 %
Lymphocytes Relative: 16 %
Lymphs Abs: 1.2 K/uL (ref 0.7–4.0)
MCH: 34.6 pg — ABNORMAL HIGH (ref 26.0–34.0)
MCHC: 33.3 g/dL (ref 30.0–36.0)
MCV: 103.8 fL — ABNORMAL HIGH (ref 80.0–100.0)
Monocytes Absolute: 0.9 K/uL (ref 0.1–1.0)
Monocytes Relative: 12 %
Neutro Abs: 5 K/uL (ref 1.7–7.7)
Neutrophils Relative %: 65 %
Platelet Count: 475 K/uL — ABNORMAL HIGH (ref 150–400)
RBC: 4.22 MIL/uL (ref 4.22–5.81)
RDW: 14.7 % (ref 11.5–15.5)
WBC Count: 7.5 K/uL (ref 4.0–10.5)
nRBC: 0 % (ref 0.0–0.2)

## 2024-08-01 LAB — IRON AND TIBC
Iron: 53 ug/dL (ref 45–182)
Saturation Ratios: 13 % — ABNORMAL LOW (ref 17.9–39.5)
TIBC: 420 ug/dL (ref 250–450)
UIBC: 367 ug/dL

## 2024-08-01 LAB — FERRITIN: Ferritin: 20 ng/mL — ABNORMAL LOW (ref 24–336)

## 2024-08-01 NOTE — Progress Notes (Signed)
 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  Significant  dyspoiesis in the erythroid or granulocytic cell lines is not seen and  no increase in blastic cells identified.  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  There is also slight plasmacytosis with lack of large  aggregates or sheets.  Special stains to assess plasma cell clonality  and degree of fibrosis will be performed and the results reported in an  addendum.  Correlation with cytogenetic studies is recommended.   MICROSCOPIC DESCRIPTION:    PERIPHERAL BLOOD SMEAR: The red blood cells display mild to moderate  anisopoikilocytosis with microcytic cells, elliptocytes, teardrop cells,  target cells.  There is moderate polychromasia.  The white blood cells  are normal in number with scattered hypogranular neutrophils.  The  platelets are increased in number.   BONE  MARROW ASPIRATE: Bone marrow particles present  Erythroid precursors: Orderly and progressive maturation  Granulocytic precursors: Orderly and progressive maturation for the most  part.  No increase in blastic cells identified  Megakaryocytes: Increased in number with many large, atypical forms, and  small and/or hypolobated cells.  Lymphocytes/plasma cells: The plasma cells are slightly increased in  number representing 6% of all cells with lack of large aggregates or  sheets.  Large lymphoid aggregates are not seen.   TOUCH PREPARATIONS: Mixture of cell types present   CLOT AND BIOPSY: The sections show 60 to 70% cellularity with a mixture  of myeloid cell types but with prominent increase in megakaryocytes with  clustering.  Many of the megakaryocytes display abnormal morphology.  Occasional small interstitial and focally para-trabecular lymphoid  aggregates composed of small lymphoid cells are seen.   IRON  STAIN: Iron  stains are performed on a bone marrow aspirate or touch  imprint smear and section of clot. The controls stained appropriately.        Storage Iron : Present, scanty       Ring Sideroblasts: Absent   # # EGD- [Dr.Ganum; [place]]. On zantac.    # Left hip replacement ------------------------------------------------   Tuesday Jan 05, 2022      Essential thrombocythemia [facility])     INTERVAL HISTORY: Alone.  Ambulating dependently.  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  Discussed the use of AI scribe software for clinical note transcription with the patient, who gave verbal consent to proceed.  History of Present Illness   Joshua Cabrera is a 68 year old male with essential thrombocythemia and iron  deficiency anemia who presents for hematology follow-up and  evaluation of new oral candidiasis.  Essential thrombocythemia is managed  with hydroxyurea  (nine pills weekly, one at dinner twice weekly and daily at breakfast), with platelet counts ranging from 598,000 to 800,000 over the past six months and currently 475,000. He has been adherent to hydroxyurea  and has not received recent iron  infusions. He expresses concern regarding the six-month interval between visits, preferring closer monitoring due to a family history of stroke. He denies abnormal bleeding or bruising. Hemoglobin remains stable.  Iron  deficiency anemia was previously managed with iron  infusions. Hemorrhoidal bleeding has resolved following initiation of psyllium fiber supplementation, and there is no ongoing rectal bleeding.  He describes a persistent cough over the past year, initially attributed to cat exposure. Allergy testing revealed unchanged cat dander sensitivity and new mold allergies, leading to adjustment of immunotherapy. Symptoms improved over the summer. Following travel to [place] in the fall, he developed an upper respiratory infection with a prolonged, violent cough. Pulmonology initiated Breo, which improved symptoms; cough recurred upon discontinuation, so he resumed Breo and is currently taking it, with mouth rinsing after each use.  Early this morning, he noted a painless white lesion on the soft palate, described as a sensation of a foreign body. He denies odynophagia or sore throat. He is scheduled for ENT evaluation later today. He has had no recent illness or infection at the time of his last blood work and no history of diabetes.       Review of Systems  Constitutional:  Positive for malaise/fatigue. Negative for chills, diaphoresis, fever and weight loss.  HENT:  Negative for nosebleeds and sore throat.   Eyes:  Negative for double vision.  Respiratory:  Negative for cough, hemoptysis, sputum production, shortness of breath and wheezing.   Cardiovascular:  Negative for chest pain, palpitations, orthopnea and leg swelling.   Gastrointestinal:  Negative for abdominal pain, blood in stool, constipation, diarrhea, heartburn, melena, nausea and vomiting.  Genitourinary:  Negative for dysuria, frequency and urgency.  Musculoskeletal:  Positive for joint pain. Negative for back pain.  Skin: Negative.  Negative for itching and rash.  Neurological:  Negative for dizziness, tingling, focal weakness, weakness and headaches.  Endo/Heme/Allergies:  Does not bruise/bleed easily.  Psychiatric/Behavioral:  Negative for depression. The patient is not nervous/anxious and does not have insomnia.       PAST MEDICAL HISTORY :  Past Medical History:  Diagnosis Date   Arthritis    osteoarthritis -hips,spine,hands and left wrist.   Arthritis    Complication of anesthesia    GERD (gastroesophageal reflux disease)    Pneumonia    PONV (postoperative nausea and vomiting)    Sleep apnea    Thrombocytosis    Unspecified diseases of blood and blood-forming organs    essential hyperthrombocytosis    PAST SURGICAL HISTORY :   Past Surgical History:  Procedure Laterality Date   BACK SURGERY  June 2015   ROTATOR CUFF REPAIR     x3(lt x2), rt. x1   TOTAL HIP ARTHROPLASTY Left 02/11/2014   Procedure: LEFT TOTAL HIP ARTHROPLASTY ANTERIOR APPROACH;  Surgeon: Dempsey Melodi GAILS, MD;  Location: [facility];  Service: Orthopedics;  Laterality: Left;   TOTAL HIP ARTHROPLASTY Right 03/11/2021   Procedure: TOTAL HIP ARTHROPLASTY ANTERIOR APPROACH;  Surgeon: Melodi Dempsey, MD;  Location: [facility];  Service: Orthopedics;  Laterality: Right;    FAMILY HISTORY :   Family History  Problem Relation Age of Onset   Cancer Maternal Aunt    Cancer Paternal Aunt  Stroke Maternal Grandfather     SOCIAL HISTORY:   Social History   Tobacco Use   Smoking status: Never   Smokeless tobacco: Never  Vaping Use   Vaping status: Never Used  Substance Use Topics   Alcohol use: Yes    Alcohol/week: 2.0 standard drinks of alcohol    Types: 2 Cans of  beer per week    Comment: beer/ wine 2glasses  per night   Drug use: No    ALLERGIES:  is allergic to hornet venom, wasp venom protein, vicodin [hydrocodone-acetaminophen ], other, and penicillins.  MEDICATIONS:  Current Outpatient Medications  Medication Sig Dispense Refill   acetaminophen  (TYLENOL ) 500 MG tablet Take 500 mg by mouth every 6 (six) hours as needed.     albuterol  (VENTOLIN  HFA) 108 (90 Base) MCG/ACT inhaler Inhale 2 puffs three times daily for one week.  Then use 2 puffs every 4-6 hours as needed for cough 8 g 2   aspirin EC 81 MG tablet Take 81 mg by mouth daily. Swallow whole.     cephALEXin  (KEFLEX ) 500 MG capsule Take 1 capsule (500 mg total) by mouth 2 (two) times daily. 20 capsule 0   cetirizine (ZYRTEC) 10 MG chewable tablet Chew 10 mg by mouth daily.     cyclobenzaprine  (FLEXERIL ) 5 MG tablet Take 2 tablets (10 mg total) by mouth 3 (three) times daily as needed for muscle spasms. 40 tablet 0   EPINEPHrine  0.3 mg/0.3 mL IJ SOAJ injection Inject 0.3 mg into the muscle as needed for anaphylaxis.  0   famotidine  (PEPCID ) 10 MG tablet Take 10 mg by mouth 2 (two) times daily.     Fe Bisgly-Vit C-Vit B12-FA (GENTLE IRON  PO) Take by mouth daily.     fluticasone (FLONASE) 50 MCG/ACT nasal spray Place 1 spray into both nostrils daily.     hydroxyurea  (HYDREA ) 500 MG capsule TAKE 1 CAPSULE DAILY ON MONDAY, WEDNESDAY, FRIDAY, SATURDAY AND SUNDAY AND 2 CAPSULES DAILY ON TUESDAY AND THURSDAY 114 capsule 1   metaxalone (SKELAXIN) 800 MG tablet Take 800 mg by mouth 3 (three) times daily.     sildenafil (REVATIO) 20 MG tablet Take 60 mg by mouth daily as needed (ED).     tamsulosin (FLOMAX) 0.4 MG CAPS capsule Take 0.4 mg by mouth daily.     BREO ELLIPTA 100-25 MCG/ACT AEPB Inhale 1 puff into the lungs daily.     No current facility-administered medications for this visit.    PHYSICAL EXAMINATION: ECOG PERFORMANCE STATUS: 0 - Asymptomatic  BP 123/71 (BP Location: Right Arm,  Patient Position: Sitting, Cuff Size: Normal)   Pulse 76   Temp 98.3 F (36.8 C) (Tympanic)   Resp 18   Ht 5' 9.5 (1.765 m)   Wt 177 lb 4.8 oz (80.4 kg)   SpO2 98%   BMI 25.81 kg/m   Filed Weights   08/01/24 1004  Weight: 177 lb 4.8 oz (80.4 kg)     Physical Exam HENT:     Head: Normocephalic and atraumatic.     Mouth/Throat:     Pharynx: No oropharyngeal exudate.  Eyes:     Pupils: Pupils are equal, round, and reactive to light.  Cardiovascular:     Rate and Rhythm: Normal rate and regular rhythm.  Pulmonary:     Effort: No respiratory distress.     Breath sounds: No wheezing.  Abdominal:     General: Bowel sounds are normal. There is no distension.     Palpations: Abdomen  is soft. There is no mass.     Tenderness: There is no abdominal tenderness. There is no guarding or rebound.  Musculoskeletal:        General: No tenderness. Normal range of motion.     Cervical back: Normal range of motion and neck supple.  Skin:    General: Skin is warm.  Neurological:     Mental Status: He is alert and oriented to person, place, and time.  Psychiatric:        Mood and Affect: Affect normal.    LABORATORY DATA:  I have reviewed the data as listed    Component Value Date/Time   NA 136 08/01/2024 0952   K 4.1 08/01/2024 0952   CL 103 08/01/2024 0952   CO2 23 08/01/2024 0952   GLUCOSE 125 (H) 08/01/2024 0952   BUN 20 08/01/2024 0952   CREATININE 0.91 08/01/2024 0952   CREATININE 0.85 07/25/2023 1357   CREATININE 0.86 02/28/2014 1450   CALCIUM 9.3 08/01/2024 0952   PROT 6.5 07/25/2023 1357   PROT 7.4 02/28/2014 1450   ALBUMIN 3.5 07/25/2023 1357   ALBUMIN 3.1 (L) 02/28/2014 1450   AST 19 07/25/2023 1357   ALT 16 07/25/2023 1357   ALT 40 02/28/2014 1450   ALKPHOS 54 07/25/2023 1357   ALKPHOS 109 02/28/2014 1450   BILITOT 1.0 07/25/2023 1357   GFRNONAA >60 08/01/2024 0952   GFRNONAA >60 07/25/2023 1357   GFRNONAA >60 02/28/2014 1450   GFRAA >60 05/12/2020 1423    GFRAA >60 02/28/2014 1450    No results found for: SPEP, UPEP  Lab Results  Component Value Date   WBC 7.5 08/01/2024   NEUTROABS 5.0 08/01/2024   HGB 14.6 08/01/2024   HCT 43.8 08/01/2024   MCV 103.8 (H) 08/01/2024   PLT 475 (H) 08/01/2024      Chemistry      Component Value Date/Time   NA 136 08/01/2024 0952   K 4.1 08/01/2024 0952   CL 103 08/01/2024 0952   CO2 23 08/01/2024 0952   BUN 20 08/01/2024 0952   CREATININE 0.91 08/01/2024 0952   CREATININE 0.85 07/25/2023 1357   CREATININE 0.86 02/28/2014 1450      Component Value Date/Time   CALCIUM 9.3 08/01/2024 0952   ALKPHOS 54 07/25/2023 1357   ALKPHOS 109 02/28/2014 1450   AST 19 07/25/2023 1357   ALT 16 07/25/2023 1357   ALT 40 02/28/2014 1450   BILITOT 1.0 07/25/2023 1357      ASSESSMENT & PLAN:   Essential thrombocythemia (HCC) # 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 low clinical suspicion for any acute process.  April 2023- Bone marrow Bx- s/o MPN; NEGATIVE for any fibrosis; or acute leukemia process.   Platelet counts decreased to 475,000. [But platelets with PCP 1 week ao 600-800] No hemorrhagic or thrombotic complications.  - Continued hydroxyurea  at current dose. - Ordered repeat CBC in one month to monitor platelet count. Continue Hydrea  500 mg a day; T/T- 2 pills day.   Oral candidiasis- New asymptomatic white lesion on soft palate, likely secondary to inhaled corticosteroid and hydroxyurea . Pending ENT evaluation. - Recommended ENT follow-up for further evaluation and management. Discussed typical treatment with topical nystatin if diagnosis is confirmed by ENT.  # Iron  deficient anemia-? etiology [DEC 2024 ferritin-20; iron  sat 32%]- s/p IV iron  infusion [pre-meds- Tylenol /benadryl ]-hemoglobin today is 13. Continue/restart gentle iron  q day.  Await labs today.   # Cough chronic- ? Allergies [on breo]- with Dr.Fleming  #  ETIOLOGY: The etiology is unclear-? hemorrhoidal  bleeding S/p endoscopies; S/p capsule study- 2023- WNL-  S/p Dr.Cintron steroids supp- improved on psyllium.  # DISPOSITION: # 1 month- cbc  # follow up in 6  months- MD; labs- cbc/bmp; iron  studies/ferritin--venofer -Dr.B  Addendum: Ferrtin- 20; I sat- 17- continue PO iron .      Cindy JONELLE Joe, MD 08/01/2024 11:27 AM

## 2024-08-01 NOTE — Progress Notes (Signed)
 Fatigue: NO Bruising: NO Nose bleeds: NO  Blood in your stool: NO Abdominal pain: NO Burning pain in the feet/hands/arms/legs/ears or face: NO   C/o white stuff growing in his soft palette. He would like you to take a look at this.  He feels like something is wrong with his epiglottis and will start to cough, x6 months, maybe once every few days.  He states he had labs done thru Dr. Tawni 07/24/24, platelets were elevated.

## 2024-08-01 NOTE — Assessment & Plan Note (Addendum)
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 low clinical suspicion for any acute process.  April 2023- Bone marrow Bx- s/o MPN; NEGATIVE for any fibrosis; or acute leukemia process.   Platelet counts decreased to 475,000. [But platelets with PCP 1 week ao 600-800] No hemorrhagic or thrombotic complications.  - Continued hydroxyurea  at current dose. - Ordered repeat CBC in one month to monitor platelet count. Continue Hydrea  500 mg a day; T/T- 2 pills day.   Oral candidiasis- New asymptomatic white lesion on soft palate, likely secondary to inhaled corticosteroid and hydroxyurea . Pending ENT evaluation. - Recommended ENT follow-up for further evaluation and management. Discussed typical treatment with topical nystatin if diagnosis is confirmed by ENT.  # Iron  deficient anemia-? etiology [DEC 2024 ferritin-20; iron  sat 32%]- s/p IV iron  infusion [pre-meds- Tylenol /benadryl ]-hemoglobin today is 13. Continue/restart gentle iron  q day.  Await labs today.   # Cough chronic- ? Allergies [on breo]- with Dr.Fleming  # ETIOLOGY: The etiology is unclear-? hemorrhoidal bleeding S/p endoscopies; S/p capsule study- 2023- WNL-  S/p Dr.Cintron steroids supp- improved on psyllium.  # DISPOSITION: # 1 month- cbc  # follow up in 6  months- MD; labs- cbc/bmp; iron  studies/ferritin--venofer -Dr.B  Addendum: Ferrtin- 20; I sat- 17- continue PO iron .

## 2024-08-04 ENCOUNTER — Encounter: Payer: Self-pay | Admitting: Internal Medicine

## 2024-09-10 ENCOUNTER — Inpatient Hospital Stay

## 2024-09-14 ENCOUNTER — Inpatient Hospital Stay: Attending: Internal Medicine

## 2024-09-14 DIAGNOSIS — D473 Essential (hemorrhagic) thrombocythemia: Secondary | ICD-10-CM | POA: Insufficient documentation

## 2024-09-14 DIAGNOSIS — Z79899 Other long term (current) drug therapy: Secondary | ICD-10-CM | POA: Insufficient documentation

## 2024-09-14 LAB — CBC WITH DIFFERENTIAL (CANCER CENTER ONLY)
Abs Immature Granulocytes: 0.05 10*3/uL (ref 0.00–0.07)
Basophils Absolute: 0.1 10*3/uL (ref 0.0–0.1)
Basophils Relative: 2 %
Eosinophils Absolute: 0.2 10*3/uL (ref 0.0–0.5)
Eosinophils Relative: 3 %
HCT: 44.4 % (ref 39.0–52.0)
Hemoglobin: 14.9 g/dL (ref 13.0–17.0)
Immature Granulocytes: 1 %
Lymphocytes Relative: 15 %
Lymphs Abs: 1.4 10*3/uL (ref 0.7–4.0)
MCH: 35.1 pg — ABNORMAL HIGH (ref 26.0–34.0)
MCHC: 33.6 g/dL (ref 30.0–36.0)
MCV: 104.7 fL — ABNORMAL HIGH (ref 80.0–100.0)
Monocytes Absolute: 1.2 10*3/uL — ABNORMAL HIGH (ref 0.1–1.0)
Monocytes Relative: 13 %
Neutro Abs: 5.9 10*3/uL (ref 1.7–7.7)
Neutrophils Relative %: 66 %
Platelet Count: 468 10*3/uL — ABNORMAL HIGH (ref 150–400)
RBC: 4.24 MIL/uL (ref 4.22–5.81)
RDW: 13.5 % (ref 11.5–15.5)
WBC Count: 8.9 10*3/uL (ref 4.0–10.5)
nRBC: 0 % (ref 0.0–0.2)

## 2025-01-29 ENCOUNTER — Inpatient Hospital Stay: Admitting: Internal Medicine

## 2025-01-29 ENCOUNTER — Inpatient Hospital Stay
